# Patient Record
Sex: Male | Born: 1940 | Race: White | Hispanic: No | Marital: Married | Smoking: Never smoker
Health system: Southern US, Community
[De-identification: ages and names within clinical notes are randomized; demographics above are authoritative.]

## PROBLEM LIST (undated history)

## (undated) DIAGNOSIS — D509 Iron deficiency anemia, unspecified: Secondary | ICD-10-CM

## (undated) DIAGNOSIS — K5090 Crohn's disease, unspecified, without complications: Secondary | ICD-10-CM

## (undated) DIAGNOSIS — D72819 Decreased white blood cell count, unspecified: Secondary | ICD-10-CM

## (undated) DIAGNOSIS — N400 Enlarged prostate without lower urinary tract symptoms: Secondary | ICD-10-CM

## (undated) DIAGNOSIS — IMO0001 Reserved for inherently not codable concepts without codable children: Secondary | ICD-10-CM

## (undated) DIAGNOSIS — N200 Calculus of kidney: Secondary | ICD-10-CM

## (undated) DIAGNOSIS — I10 Essential (primary) hypertension: Secondary | ICD-10-CM

## (undated) DIAGNOSIS — C9200 Acute myeloblastic leukemia, not having achieved remission: Secondary | ICD-10-CM

## (undated) DIAGNOSIS — G629 Polyneuropathy, unspecified: Secondary | ICD-10-CM

## (undated) HISTORY — PX: CATARACT EXTRACTION: SUR2

## (undated) HISTORY — PX: LUMBAR FUSION: SHX111

## (undated) HISTORY — PX: KIDNEY STONE SURGERY: SHX686

---

## 1997-10-29 ENCOUNTER — Ambulatory Visit (HOSPITAL_COMMUNITY): Admission: RE | Admit: 1997-10-29 | Discharge: 1997-10-29 | Payer: Self-pay | Admitting: Gastroenterology

## 1998-05-19 ENCOUNTER — Ambulatory Visit (HOSPITAL_COMMUNITY): Admission: RE | Admit: 1998-05-19 | Discharge: 1998-05-19 | Payer: Self-pay | Admitting: Cardiology

## 1999-05-09 HISTORY — PX: CARDIAC CATHETERIZATION: SHX172

## 2000-06-13 ENCOUNTER — Encounter: Payer: Self-pay | Admitting: Family Medicine

## 2000-06-13 ENCOUNTER — Ambulatory Visit (HOSPITAL_COMMUNITY): Admission: RE | Admit: 2000-06-13 | Discharge: 2000-06-13 | Payer: Self-pay | Admitting: Family Medicine

## 2000-06-21 ENCOUNTER — Ambulatory Visit (HOSPITAL_COMMUNITY): Admission: RE | Admit: 2000-06-21 | Discharge: 2000-06-21 | Payer: Self-pay | Admitting: Gastroenterology

## 2000-06-21 ENCOUNTER — Encounter (INDEPENDENT_AMBULATORY_CARE_PROVIDER_SITE_OTHER): Payer: Self-pay | Admitting: Specialist

## 2000-07-10 ENCOUNTER — Encounter: Payer: Self-pay | Admitting: Gastroenterology

## 2000-07-10 ENCOUNTER — Encounter: Admission: RE | Admit: 2000-07-10 | Discharge: 2000-07-10 | Payer: Self-pay | Admitting: Gastroenterology

## 2000-12-21 ENCOUNTER — Encounter: Admission: RE | Admit: 2000-12-21 | Discharge: 2000-12-21 | Payer: Self-pay | Admitting: Family Medicine

## 2000-12-21 ENCOUNTER — Encounter: Payer: Self-pay | Admitting: Family Medicine

## 2000-12-22 ENCOUNTER — Encounter: Admission: RE | Admit: 2000-12-22 | Discharge: 2000-12-22 | Payer: Self-pay | Admitting: Family Medicine

## 2000-12-22 ENCOUNTER — Encounter: Payer: Self-pay | Admitting: Family Medicine

## 2000-12-26 ENCOUNTER — Encounter: Admission: RE | Admit: 2000-12-26 | Discharge: 2000-12-26 | Payer: Self-pay | Admitting: Neurosurgery

## 2000-12-26 ENCOUNTER — Encounter: Payer: Self-pay | Admitting: Neurosurgery

## 2001-01-01 ENCOUNTER — Encounter: Payer: Self-pay | Admitting: Neurosurgery

## 2001-01-01 ENCOUNTER — Encounter: Admission: RE | Admit: 2001-01-01 | Discharge: 2001-01-01 | Payer: Self-pay | Admitting: Neurosurgery

## 2001-01-15 ENCOUNTER — Encounter: Admission: RE | Admit: 2001-01-15 | Discharge: 2001-01-15 | Payer: Self-pay | Admitting: Neurosurgery

## 2001-01-15 ENCOUNTER — Encounter: Payer: Self-pay | Admitting: Neurosurgery

## 2001-06-11 ENCOUNTER — Encounter (INDEPENDENT_AMBULATORY_CARE_PROVIDER_SITE_OTHER): Payer: Self-pay | Admitting: Specialist

## 2001-06-11 ENCOUNTER — Ambulatory Visit (HOSPITAL_COMMUNITY): Admission: RE | Admit: 2001-06-11 | Discharge: 2001-06-11 | Payer: Self-pay | Admitting: Gastroenterology

## 2001-07-10 ENCOUNTER — Encounter: Payer: Self-pay | Admitting: Family Medicine

## 2001-07-10 ENCOUNTER — Ambulatory Visit (HOSPITAL_COMMUNITY): Admission: RE | Admit: 2001-07-10 | Discharge: 2001-07-10 | Payer: Self-pay | Admitting: Family Medicine

## 2002-03-27 ENCOUNTER — Encounter: Payer: Self-pay | Admitting: Neurosurgery

## 2002-03-27 ENCOUNTER — Encounter: Admission: RE | Admit: 2002-03-27 | Discharge: 2002-03-27 | Payer: Self-pay | Admitting: Neurosurgery

## 2002-03-30 ENCOUNTER — Ambulatory Visit (HOSPITAL_BASED_OUTPATIENT_CLINIC_OR_DEPARTMENT_OTHER): Admission: RE | Admit: 2002-03-30 | Discharge: 2002-03-30 | Payer: Self-pay | Admitting: Cardiology

## 2002-04-10 ENCOUNTER — Encounter: Payer: Self-pay | Admitting: Neurosurgery

## 2002-04-10 ENCOUNTER — Encounter: Admission: RE | Admit: 2002-04-10 | Discharge: 2002-04-10 | Payer: Self-pay | Admitting: Neurosurgery

## 2002-08-25 ENCOUNTER — Encounter: Admission: RE | Admit: 2002-08-25 | Discharge: 2002-11-23 | Payer: Self-pay | Admitting: Family Medicine

## 2003-09-23 ENCOUNTER — Inpatient Hospital Stay (HOSPITAL_COMMUNITY): Admission: RE | Admit: 2003-09-23 | Discharge: 2003-09-29 | Payer: Self-pay | Admitting: Neurosurgery

## 2003-12-14 ENCOUNTER — Encounter: Admission: RE | Admit: 2003-12-14 | Discharge: 2003-12-14 | Payer: Self-pay | Admitting: Family Medicine

## 2004-05-18 ENCOUNTER — Inpatient Hospital Stay (HOSPITAL_COMMUNITY): Admission: AD | Admit: 2004-05-18 | Discharge: 2004-05-20 | Payer: Self-pay | Admitting: Internal Medicine

## 2004-08-08 ENCOUNTER — Inpatient Hospital Stay (HOSPITAL_COMMUNITY): Admission: EM | Admit: 2004-08-08 | Discharge: 2004-08-12 | Payer: Self-pay | Admitting: Emergency Medicine

## 2004-09-02 ENCOUNTER — Ambulatory Visit (HOSPITAL_COMMUNITY): Admission: RE | Admit: 2004-09-02 | Discharge: 2004-09-02 | Payer: Self-pay | Admitting: *Deleted

## 2005-02-09 ENCOUNTER — Ambulatory Visit: Payer: Self-pay | Admitting: Internal Medicine

## 2005-05-10 ENCOUNTER — Encounter: Admission: RE | Admit: 2005-05-10 | Discharge: 2005-05-10 | Payer: Self-pay | Admitting: Family Medicine

## 2009-06-07 ENCOUNTER — Encounter: Admission: RE | Admit: 2009-06-07 | Discharge: 2009-06-07 | Payer: Self-pay | Admitting: Gastroenterology

## 2010-09-23 NOTE — H&P (Signed)
[name], WESBY                ACCOUNT NO.:  [id number]   MEDICAL RECORD NO.:  [id number]          PATIENT TYPE:  EMS   LOCATION:  ED                           FACILITY:  [facility]   PHYSICIAN:  Hollice Espy, M.D.DATE OF BIRTH:  20-Apr-1941   DATE OF ADMISSION:  05/18/2004  DATE OF DISCHARGE:                                HISTORY & PHYSICAL   CONSULTING PHYSICIAN FOR THE CASE:  Fayrene Fearing L. Randa Evens, M.D., [facility].   CHIEF COMPLAINT:  Bright-red blood from the rectum.   HISTORY OF PRESENT ILLNESS:  The patient is a 70 year old white male with a  past medical history of hypertension, chronic back pain and Crohn's disease.  He has been doing relatively well, followed by [facility]. His last  colonoscopy was done approximately a year ago. At that time diverticulosis  was noted as well as a few polyps. The patient has had no problems with  bleeding, and then starting earlier this evening he had a bowel movement and  noticed a significant amount of bright-red blood. This continued and he  filled up the bowl with bright blood. He became very lightheaded and briefly  passed out, not recalling events for a few minutes. His wife became quite  concerned, called the paramedics and had the patient brought in to the  [facility]. There the patient was noted to be normotensive  and not tachycardic. However, his H&H was noted to be decreased at 8.7 and  26.9, however, he did have an MCV at 72. The rest of his lab work was  unremarkable with a noticed glucose of 172. The patient received subsequent  IV fluids, and [facility] were contacted who after reviewing  the patient's labs asked that the patient be admitted under [facility]. Currently the patient states that he is feeling better.  He denies any headaches, visual changes, dysphagia, chest pain,  palpitations, shortness of breath, wheeze, cough, abdominal pain, hematuria,  dysuria, constipation, or  diarrhea. He denies any focal extremity pain and  weakness, but overall feels quite fatigued.   PAST MEDICAL HISTORY:  1.  Crohn's disease.  2.  Diverticulosis.  3.  History of colonic polyps.  4.  Chronic back pain, status post surgery.  5.  GERD.  6.  Hypertension.  7.  He also has a sleeping disorder, I am not sure if this is sleep apnea or      not.   MEDICATIONS:  1.  The patient is on Flexeril 1 p.o. t.i.d. p.r.n.  2.  AcipHex 20 p.o. daily.  3.  Pentasa 1000 mg 4 times a day.  4.  Diovan 160/12.5 p.o. daily.  5.  Cardizem ER 240 mg p.o. daily.  6.  Flonase several times a week.   ALLERGIES:  The patient has no known drug allergies.   SOCIAL HISTORY:  He denies any tobacco, alcohol or drug use.   FAMILY HISTORY:  Noncontributory.   PHYSICAL EXAMINATION:  VITAL SIGNS ON ADMISSION:  Temperature 97.8, heart  rate 70, blood pressure 118/74, respirations 20, O2 saturation 100% on  room  air.  GENERAL:  The patient is alert and oriented x3, in no apparent distress.  HEENT:  Normocephalic, atraumatic. His mucous membranes are slightly dry.  NECK:  He has no carotid bruits.  HEART:  Regular rate and rhythm, S1 and S2.  LUNGS:  Clear to auscultation bilaterally.  ABDOMEN:  Soft, obese, nontender, positive bowel sounds. Please note that  the patient prior to having his bloody BM had some mild abdominal discomfort  in the right lower quadrant, this was mild, about a 4/10; this quickly  resolved and he currently has no abdominal pain and no abdominal tenderness  with exam.  EXTREMITIES:  Showed no clubbing or cyanosis, but perhaps a trace pitting  edema.   LABORATORY WORK:  White count of 9.9; 86% shift; H&H 8.7 and 26.9, MCV of  72, platelet count 284. Sodium 140, potassium 4.1, chloride 112, bicarb 26,  BUN 14, creatinine 1.2, glucose 172, albumin 2.5, total protein 5. The rest  of his LFTs are within normal limits. PT 13.8, INR 1.1.   ASSESSMENT AND PLAN:  1.  Lower  gastrointestinal bleed, suspected likely diverticular. He has a      decreased H&H, but I am unsure of his baseline as the patient has a low      MCV. We will go ahead and check an H&H q.8h. [facility] to see in the      morning. Diverticulosis was noted on his C-scope approximately a year      ago.  2.  History of Crohn's disease. Several years on Pentasa, it is moderately      well controlled.  3.  Microcytic anemia. Will check the patient's labs to see how far back his      MCV has been this low.  4.  Chronic back pain. On Flexeril, nonsteroidals and on AcipHex.  5.  Hypertension. For now since he is having episodes of GI bleeding will      hold his antihypertensive medications.     Send   SKK/MEDQ  D:  05/18/2004  T:  05/18/2004  Job:  [id number]   cc:   Bryan Lemma. Manus Gunning, M.D.  [address] [place]  [place] [address]  Fax: [phone]   Llana Aliment. Malon Kindle., M.D.  [address], [address]  [place]  [place] [address]  Fax: [phone]

## 2010-09-23 NOTE — Procedures (Signed)
[place]  Patient:    [name], [name] Visit Number: [id number] MRN: [id number]          Service Type: END Location: ENDO Attending Physician:  Orland Mustard Dictated by:   Llana Aliment. Randa Evens, M.D. Proc. Date: 06/11/01 Admit Date:  06/11/2001   CC:         Dyanne Carrel, M.D.   Procedure Report  DATE OF BIRTH:  Dec 20, 1940  PROCEDURE PERFORMED:  Esophagogastroduodenscopy with biopsies.  ENDOSCOPIST:  Llana Aliment. Edwards, M.D.  MEDICATIONS:  Versed 7.5 mg, fentanyl 75 mcg IV.  INDICATIONS:  Iron deficiency anemia.  DESCRIPTION OF PROCEDURE:  The procedure had been explained to the patient and consent obtained.  With the patient in the left lateral decubitus position, the Olympus video endoscope was inserted blindly in the esophagus and advanced under direct visualization.  The stomach was entered.  The pylorus identified and passed.  The duodenum including the bulb and second portion were seen well.  We went to the full extent of the scope which was felt to be down in the third part of the duodenum and took random biopsies to look for evidence of celiac disease.  The entire duodenum including the second and third portion of the duodenal bulb were normal.  The antrum and body of the stomach were normal.  The fundus and cardia were seen well on the retroflex view and were normal.  The esophagus was seen well upon removal of the scope and appeared endoscopically normal.  The patient tolerated the procedure quite well, was maintained on low-flow oxygen and pulse oximeter throughout the procedure with no obvious abnormalities.  ASSESSMENT:  Iron deficiency anemia with normal endoscopy.  PLAN:  Will check path but keep on the same medicines for now.  Take an iron tablet daily.  See back in the office in two months. Dictated by:   Llana Aliment. Randa Evens, M.D. Attending Physician:  Orland Mustard DD:  06/11/01 TD:  06/12/01 Job:  [id number]

## 2010-09-23 NOTE — Consult Note (Signed)
[name], [name]                ACCOUNT NO.:  [id number]   MEDICAL RECORD NO.:  [id number]          PATIENT TYPE:  INP   LOCATION:  5018                         FACILITY:  [facility]   PHYSICIAN:  Graylin Shiver, M.D.   DATE OF BIRTH:  [birth year]   DATE OF CONSULTATION:  08/08/2004  DATE OF DISCHARGE:                                   CONSULTATION   REASON FOR CONSULTATION:  The patient is a 70 year old Caucasian male with a  history of diverticulosis and Crohn's disease.  He was admitted to the  hospital because of lower GI bleeding. The patient states that he began to  experience the rectal bleeding last night. He has continued to have some  this morning, although it has slowed down. His hemoglobin on admission was  10.9 and dropped to 8.3.  He is receiving transfusions.   The patient underwent colonoscopy in January because of a similar episode of  lower GI bleeding which was felt to be secondary to diverticulosis. The  patient has a narrowing in the region of the ascending colon which has been  known for quite sometime.  Both I and Dr. Randa Evens have been unable to  transverse this narrowed area in the ascending colon, which was seen at his  last colonoscopy also.   PAST HISTORY:  Medical problems:  In addition to Crohn's and diverticulosis,  he has hypertension, obesity, DJD, GERD.   ALLERGIES:  NONE KNOWN.   MEDICATIONS PRIOR TO ADMISSION:  Aciphex, diltiazem SR, hydrochlorothiazide,  Pentasa, aspirin.   SOCIAL HISTORY:  Does not smoke or drink alcohol.   REVIEW OF SYSTEMS:  No weakness or lightheadedness. No shortness of breath,  cough or sputum production. No anginal chest pains.   PHYSICAL:  GENERAL:  He does not appear in any acute distress.  VITALS:  Stable  HEART: Regular rhythm. No murmurs.  LUNGS:  Clear.  ABDOMEN:  Bowel sounds are normal. It is soft, nontender, no  hepatosplenomegaly.   IMPRESSION:  Lower gastrointestinal bleeding, most probably of  diverticular  origin again.   PLAN:  Transfuse as needed. Follow H&H. I will obtain a nuclear medicine GI  bleeding scan to see if we can localize the site of bleeding. At this time I  do not think he needs another colonoscopy since he just had one in January.      SFG/MEDQ  D:  08/08/2004  T:  08/08/2004  Job:  [id number]   cc:   Fayrene Fearing L. Malon Kindle., M.D.  [address], [address]  [place]  [place] [address]  Fax: [phone]   Candyce Churn, M.D.  [address] [place]  [place] [address]  Fax: [phone]

## 2010-09-23 NOTE — Procedures (Signed)
[facility]  Patient:    [name], [name]                       MRN: [id number] Proc. Date: 06/21/00 Adm. Date:  [id number] Attending:  Orland Mustard CC:         Blair Heys, M.D.                           Procedure Report  PROCEDURE:  Colonoscopy and biopsy.  MEDICATIONS:  Fentanyl 100 mcg, Versed 10 mg IV.  SCOPE:  Pediatric video colonoscope.  INDICATIONS:  A 70 year old gentleman who has had colonoscopy performed in 1999, showing what was felt to be a hepatic flexure stricture.  Concern was whether this was ischemic or Crohns.  Biopsy showed focal active mucus colitis.  He has been doing well since then and has not had any problems.  He developed abdominal pain.  A CT of the abdomen was performed showing a left renal cyst, diverticulosis, with thickening of the distal terminal ileum.  No other clear findings.  This was felt to be either Crohns disease or infectious enterocolitis.  There were no signs of active diverticulitis.  This procedure is done to try to evaluate this area endoscopically.  The patient has been on therapy with Flagyl and Cipro.  DESCRIPTION OF PROCEDURE:  The procedure had been explained to the patient and consent obtained.  With the patient in the left lateral decubitus, the adult Olympus pediatric video colonoscope was inserted.  We chose to use the pediatric scope since we had had problems getting through the stricture with the adult scope last time, in the hopes that this smaller scope would allow [place] to pass the strictured area.  The prep was excellent.  We were able to advance to the right colon and what I was felt was the mid to proximal ascending colon and, again, the strictured area was seen.  We were not able to pass with the scope primarily due to the tortuosity of the colon and the fact that despite multiple position changes, abdominal pressure, etc., we were unable to pass through the strictured area.  At  this point, I biopsied around the area and took random biopsies throughout the colon.  The mucosa was completely normal with good vascular pattern.  No evidence of active colitis throughout.  There were scattered diverticula.  In jar #1, we had biopsies of the right and transverse colon, in jar #2, the descending and sigmoid colon.  Scattered diverticula were seen.  The scope was withdrawn.  The patient tolerated the procedure well.  ASSESSMENT: 1. 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 2. No evidence of gross colitis. 3. Scattered diverticulosis.  PLAN:  We will check pathology for evidence of inflammatory bowel disease.  We will arrange a small-bowel follow-through.  We will call him at home and get that set up at the same location as the CT scan.  We will see back in the office in 2-3 weeks. DD:  06/21/00 TD:  06/22/00 Job: [id number]

## 2010-09-23 NOTE — Op Note (Signed)
NAMEWILLIAN, [name]                ACCOUNT NO.:  [id number]   MEDICAL RECORD NO.:  [id number]          PATIENT TYPE:  INP   LOCATION:  3707                         FACILITY:  [facility]   PHYSICIAN:  Graylin Shiver, M.D.   DATE OF BIRTH:  06/23/1940   DATE OF PROCEDURE:  05/20/2004  DATE OF DISCHARGE:                                 OPERATIVE REPORT   PROCEDURE:  Colonoscopy.   INDICATIONS:  The patient is a 70 year old male with a history of Crohn's  disease affecting the terminal ileum. He presented to the hospital with  rectal bleeding. There is also a history of diverticulosis. He also has a  history of a stricture or narrowing in the region of the ascending colon  based on prior colonoscopies.  The patient is undergoing colonoscopy as part  of his inpatient evaluation due to the rectal bleeding. The bleeding did  require the blood transfusions.   Informed consent was obtained after explanation of the risks of bleeding,  infection and perforation.   PREMEDICATION:  Fentanyl 60 mcg IV, Versed 7 mg IV.   PROCEDURE:  With the patient in the left lateral decubitus position, a  rectal exam was performed. No masses were felt. The Olympus colonoscope was  inserted into the rectum and advanced around the colon to the region of the  proximal ascending colon.  At this point there was a narrowing noted in the  lumen.  His mucosa looked normal. It was just narrowed. I could not get the  scope beyond this point.  I placed the patient on his back.  I placed the  patient on his right side but despite all maneuvers, could not advance the  scope beyond this point.  The scope was brought out visualizing the mucosa.  The ascending colon otherwise looked normal. The transverse colon looked  normal. There were numerous scattered diverticula in the descending and  sigmoid colon.  The rectum looked normal.  He tolerated the procedure well  without complications.   IMPRESSION:  1.  Left-sided  diverticulosis.  I believe that his lower gastrointestinal      bleed was of diverticular origin.  2.  Narrowing of the lumen in the proximal ascending colon. The mucosa in      this region looked smooth and normal. It is just narrowed.  3.  No evidence of blood seen on this examination.   PLAN:  I believe that the patient's lower GI bleed was secondary to a  diverticular origin. There is no evidence of active bleeding now.  His  hemoglobin today is 9.2.  I think that if there is no further bleeding, He  could go home and follow-up as an outpatient.       SFG/MEDQ  D:  05/20/2004  T:  05/20/2004  Job:  [id number]   cc:   Sherin Quarry, MD   Llana Aliment. Malon Kindle., M.D.  [address], [address]  [place]  [place] [address]  Fax: [phone]

## 2010-09-23 NOTE — Consult Note (Signed)
NAME:  [name], [name]                ACCOUNT NO.:  [id number]   MEDICAL RECORD NO.:  [id number]          PATIENT TYPE:  INP   LOCATION:  3707                         FACILITY:  [facility]   PHYSICIAN:  Graylin Shiver, M.D.   DATE OF BIRTH:  Mar 24, 1941   DATE OF CONSULTATION:  05/18/2004  DATE OF DISCHARGE:                                   CONSULTATION   REASON FOR CONSULTATION:  This patient is a 70 year old male who presented  to the emergency room at [facility] last evening with complaints of  rectal bleeding which started at about 6:30 p.m. yesterday.  The patient  continued to have bleeding and was subsequently admitted to the hospital.  He has a history of Crohn's disease and is being followed by Dr. Carman Ching.  He also has a history of diverticulosis.  His prior colonoscopy  was in February 2003 at which time he was found to have scattered  diverticula, no evidence of active colitis and a stricture at the region of  the ascending colon.  He has been on Pentasa for his Crohn's disease.   The patient also had an EGD in February 2003 which was normal.  The patient  denies any hematemesis.   PAST HISTORY:  1.  Crohn's disease.  2.  Diverticulosis.  3.  Chronic back pain.  4.  History of GERD.  5.  Hypertension.   PAST SURGICAL HISTORY:  Back surgery.   ALLERGIES:  NONE KNOWN.   MEDICATIONS PRIOR TO ADMISSION:  Pentasa, Aciphex, Diovan, Cardizem,  Flexeril.   SOCIAL HISTORY:  He does not smoke or drink alcohol.   FAMILY HISTORY:  Positive for colon cancer.   REVIEW OF SYSTEMS:  No chest pain, shortness of breath, cough or sputum  production.  The patient did pass out last night when he had this bleeding.   PHYSICAL EXAMINATION:  GENERAL:  He does not appear in any acute distress.  HEENT:  He is nonicteric.  NECK:  Supple.  HEART:  Regular rhythm, no murmurs.  LUNGS:  Clear.  ABDOMEN:  Bowel sounds are normal, soft, nontender, no hepatosplenomegaly.   IMPRESSION:  Lower gastrointestinal bleeding, this is most likely of  diverticular origin.  The patient also has a history of Crohn's disease  being treated with Pentasa.   Hemoglobin 8.1.   PLAN:  The patient is going to be transfused blood.  He appears to be stable  at this time, he has not had any further episodes of bleeding today.  He  states that he is due for a repeat colonoscopy some time in the near future.  I think that in view of the rectal bleeding that he has had now and prior  history that we can proceed with colonoscopy in the next couple of days  after an adequate prep.       SFG/MEDQ  D:  05/18/2004  T:  05/18/2004  Job:  [id number]   cc:   Fayrene Fearing L. Malon Kindle., M.D.  [address]  [place]  [place] [address]  Fax: [phone]  [facility]

## 2010-09-23 NOTE — Discharge Summary (Signed)
NAMECLAUDY, [name]                ACCOUNT NO.:  [id number]   MEDICAL RECORD NO.:  [id number]          PATIENT TYPE:  INP   LOCATION:  5018                         FACILITY:  [facility]   PHYSICIAN:  Jackie Plum, M.D.DATE OF BIRTH:  1940/08/12   DATE OF ADMISSION:  08/07/2004  DATE OF DISCHARGE:  08/12/2004                                 DISCHARGE SUMMARY   DISCHARGE DIAGNOSES:  1.  Lower gastrointestinal bleed presumed to be diverticular bleed.  2.  Anemia of chronic blood loss secondary to diagnosis #1.  3.  History of Crohn's disease and diverticulosis.  4.  Hypertension.  5.  Obesity.  6.  Degenerative joint disease.   DISCHARGE MEDICATIONS:  Patient was asked to resume his home medications.  He was discharged on iron 325 mg b.i.d.   He was asked to follow up with Dr. Evette Cristal in 10-14 days.   REASON FOR ADMISSION:  GI bleed.  Patient with history of diverticulosis and  Crohn's disease.  Was admitted to the hospital on account of hematochezia.  He presented with hemoglobin of 10.9 which subsequently dropped to 8.3.  Was  admitted to the hospital, received blood transfusion .  His hemoglobin was  monitored.  He was seen in consultation by Dr. Evette Cristal of GI medicine.  He  recommended bleeding scan and came out to be negative.  Expectant  observation and management was instituted with followup of hemoglobin.  He  was discharged in stable satisfactory condition on August 12, 2004 with a plan  for him to see GI medicine as noted above.       GO/MEDQ  D:  11/10/2004  T:  11/10/2004  Job:  [id number]

## 2010-09-23 NOTE — Discharge Summary (Signed)
NAME:  [name], [name]                          ACCOUNT NO.:  [id number]   MEDICAL RECORD NO.:  [id number]                   PATIENT TYPE:  INP   LOCATION:  3027                                 FACILITY:  [facility]   PHYSICIAN:  Coletta Memos, M.D.                  DATE OF BIRTH:  Jun 02, 1940   DATE OF ADMISSION:  09/23/2003  DATE OF DISCHARGE:  09/29/2003                                 DISCHARGE SUMMARY   ADMISSION DIAGNOSES:  Spondylolisthesis L5-S1, spondylolysis L5, lumbar  radiculopathy L5 bilateral.   POSTOPERATIVE AND DISCHARGE DIAGNOSES:  Spondylolisthesis L5-S1,  spondylolysis L5, lumbar radiculopathy L5 bilateral.   PROCEDURE:  L5 Gill, posterolateral arthrodesis L5-S1, posterior and  nonsegmental instrumentation L5 S1, morselized auto and allograft, posterior  lumbar antibody fusion L5-S1, 11 mm (P-cages placed bilaterally).   COMPLICATIONS:  None.   DISCHARGE STATUS:  Alive and well.   INDICATIONS:  [name] was admitted and taken to the operating room for a  longstanding spondylolisthesis and history of low back pain.  His operation  went well and postoperatively he was doing well.  On postop day #2 ,when he  first got up he had significant amount of drainage from his wound. There was  old blood and he was taken back to bed.  The wound continued to drain over  the next few days though drain with lesser amounts over that course of time.  He has one episode supposedly of running a fever to approximately 102  degrees.  He was started on vancomycin when that was done.  He was given  Kefzol on the first day when the drainage was noted on post op day #2. He  received 48 hours wroth of vancomycin.  His discharge wound is clean ,dry,  without signs of infection.  He is afebrile moving all extremities well.  He  has had a bowel movement.  He is using his bladder without difficulty.                                                Coletta Memos, M.D.    KC/MEDQ  D:  09/29/2003   T:  09/30/2003  Job:  [id number]

## 2010-09-23 NOTE — Discharge Summary (Signed)
[name], [name]                ACCOUNT NO.:  [id number]   MEDICAL RECORD NO.:  [id number]          PATIENT TYPE:  INP   LOCATION:  3707                         FACILITY:  [facility]   PHYSICIAN:  Theone Stanley, MD   DATE OF BIRTH:  1940/05/14   DATE OF ADMISSION:  05/18/2004  DATE OF DISCHARGE:  05/20/2004                                 DISCHARGE SUMMARY   ADMITTING DIAGNOSES:  1.  Gastrointestinal bleed.  2.  Crohn's disease.  3.  Hypertension.  4.  Gastroesophageal reflux disease.  5.  Chronic low back pain.  6.  History of diverticulosis.   DISCHARGE DIAGNOSES:  1.  Gastrointestinal bleed most likely secondary to diverticulitis.  2.  Crohn's disease.  3.  Chronic low back pain.  4.  Gastroesophageal reflux disease.  5.  Hypertension.   CONSULTS:  [facility], Dr. Evette Cristal   PROCEDURES/DIAGNOSTIC TESTS:  Patient had a colonoscopy performed on May 20, 2004.  He had diverticulosis of the left colon, narrowing of the lumen  in proximal ascending colon which was seen before, smooth normal mucosa.  Impression was suspected bleed secondary to diverticulosis.  No active  bleeding.  Full report still pending.   HOSPITAL COURSE:  [name] is a very pleasant 70 year old male with past  medical history of Crohn's, hypertension, GERD presenting with bright red  blood per rectum.  The day of his admission patient had bowel movement which  noted a significant amount of blood.  He had another one which filled the  bowl with bright red blood.  He became very lightheaded and briefly passed  out.  Did not remember events after that.  EMS was called at that point in  time and he was brought to [facility] Emergency Room.  His hemoglobin and  hematocrit at that time was 8.7 and 26.9.  Patient received IV fluids.  It  was typed and crossed.  However, because there were no beds in [place]  he was transferred over to [facility].  At that point in time patient was  monitored with serial  H&Hs.  A repeat blood level was at 7.6, 23.1.  Patient  was also orthostatic.  He was transfused 2 units.  He had one more episode  of bright red blood per rectum in the next day.  Patient was scheduled for a  colonoscopy on the 13th.  He underwent this.  There was no evidence of  bleeding and he had no more occurrences.  His hemoglobin had stabilized to  9.2, hematocrit at 27.4.  Because he had no active bleeding, it was felt  that he could go home.  He was stable.  His vital signs on discharge were  temperature of 98.9, pulse of 66, respiratory rate 18, systolic blood  pressure 138/82, saturating 96% on room air.  Patient was discharged in  stable condition.   DISCHARGE MEDICATIONS:  1.  Aciphex 20 mg one p.o. daily.  2.  Pentasa 1000 mg one p.o. q.i.d.  3.  Flexeril 10 mg one p.o. t.i.d. p.r.n.  4.  His BP  medications will be on hold until he sees his primary care      physician.  The medications are Diovan 160/12.5 mg one p.o. daily and      Cardizem ER 240 mg one p.o. daily.   DISCHARGE INSTRUCTIONS:  Patient was instructed he could resume his previous  diet to avoid episodes of constipation.  He is to check his blood pressure  daily.  If greater than 160 systolic he is to call his primary care  physician.  Follow up with Dr. Manus Gunning 7-10 days and Dr. Randa Evens two to  three weeks.  He is to have CBC drawn when he sees Dr. Manus Gunning.  Dr. Manus Gunning  will make the determination to restart his blood pressure medications at  that time.      Atta   AEJ/MEDQ  D:  05/20/2004  T:  05/20/2004  Job:  [id number]   cc:   Bryan Lemma. Manus Gunning, M.D.  [address] [place]  [place] [address]  Fax: [phone]   Llana Aliment. Malon Kindle., M.D.  [address], [address]  [place]  [place] [address]  Fax: [phone]

## 2010-09-23 NOTE — Op Note (Signed)
NAME:  [name], [name]                          ACCOUNT NO.:  [id number]   MEDICAL RECORD NO.:  [id number]                   PATIENT TYPE:  INP   LOCATION:  2899                                 FACILITY:  [facility]   PHYSICIAN:  Coletta Memos, M.D.                  DATE OF BIRTH:  Nov 27, 1940   DATE OF PROCEDURE:  09/23/2003  DATE OF DISCHARGE:                                 OPERATIVE REPORT   PREOPERATIVE DIAGNOSIS:  Lumbar spondylolisthesis, L5-S1; lumbar  spondylolysis, L5; lumbar radiculopathy, L5.   POSTOPERATIVE DIAGNOSIS:  Lumbar spondylolisthesis, L5-S1; lumbar  spondylolysis, L5; lumbar radiculopathy, L5.   OPERATION PERFORMED:  1. L5 Gill procedure.  2. Posterolateral arthrodesis, L5-S1.  3. Posterior nonsegmental instrumentation, L5-S1, pedicle screw placement.  4. Morcelized autograft and allograft.  5. Posterior lumbar interbody fusion, L5-S1, 11 mm Synthes PEAK cages.   SURGEON:  Coletta Memos, M.D.   ASSISTANT:  Hewitt Shorts, M.D.   ANESTHESIA:  General endotracheal.   COMPLICATIONS:  None.   INDICATIONS FOR PROCEDURE:  [name] is a 70 year old whom I have  followed now for a number of years.  He has a bilateral spondylolysis of L5,  a grade 1 spondylolisthesis of L5 on S1and resultant foraminal compression  of the right and left L5 nerve roots.  At this point he has decided there  has been too much pain.  I therefore recommended and he agreed to undergo  posterolateral arthrodesis and posterior lumbar interbody fusion and Gill  procedure.   DESCRIPTION OF PROCEDURE:  [name] was brought to the operating room  intubated and placed under general anesthetic without difficulty.  He was  rolled prone onto body rolls after having a Foley catheter placed under  sterile conditions.  All pressure points were properly padded.  The skin was  prepped and he was draped in sterile fashion.  Using a preoperative  localizing film as a guide, I infiltrated 20 mL  0.5% lidocaine 1:200,000  strength epinephrine.  I opened the skin and took that down to the  thoracolumbar fascia.  I exposed the lamina of L3, L4, L5 and at S1  bilaterally.  I then proceeded with a Gill of L5 using Leksell rongeur and  Kerrison punch along with straight curets.  After performing a Gill  procedure, I then proceeded to perform a diskectomy of L5-S1.  The disk  space was quite tight initially, but after removing some of the disk  material and getting instruments into the disk space, I was able to then  decompress it quite well.  After decompressing the disk space and making  sure that both L5 nerve roots were very well decompressed, I then was able  to perform a posterior lumbar interbody fusion using 11 mm Synthes PEAK  cages packed with morcelized autograft.  That was done bilaterally.  X-ray  showed that the PEAK cates were in good  position, so I then turned my  attention to the posterolateral arthrodesis.  I decorticated the transverse  processes of L5 and the sacral ala bilaterally.  Having identified the  pedicle, I then with use of fluoroscopic guidance first drilled, placed the  pedicle probe, then finally a screw at each level, checking each level to  make sure that there was no bony reach using a pedicle probe.  Four screws  were placed, two 50 x 6.5 mm screws at L5, 45 x 6.5 mm screws at S1.  I  placed rods in both locations.  I packed morcelized auto and allograft  posterolaterally.  When I was done, I irrigated the wound.  I then closed  the wound in layered fashion with Dr. Earl Gala assistance.  The wound was  closed in a layered fashion using Vicryl sutures to reapproximate the wound.  Dermabond, Steri-Strips and 4 x 4s were used for sterile dressing.  The  patient tolerated the procedure well.                                               Coletta Memos, M.D.    KC/MEDQ  D:  09/23/2003  T:  09/24/2003  Job:  [id number]

## 2010-09-23 NOTE — H&P (Signed)
NAMEFAY, SWIDER                ACCOUNT NO.:  [id number]   MEDICAL RECORD NO.:  [id number]          PATIENT TYPE:  INP   LOCATION:  5018                         FACILITY:  [facility]   PHYSICIAN:  Candyce Churn, M.D.DATE OF BIRTH:  Feb 04, 1941   DATE OF ADMISSION:  08/07/2004  DATE OF DISCHARGE:                                HISTORY & PHYSICAL   CHIEF COMPLAINT:  Rectal bleeding.   HISTORY OF PRESENT ILLNESS:  Mr. Coryell is a very pleasant 70 year old male  with a history of:  1)  Crohn's disease; 2) diverticulosis; 3)  hypertension; 4)  obesity; 5)  lumbar DJD, with history of L5-S1 surgery in  the past; 6)  ventral hernia; 7)  GERD; 8)  history of elevated heart rate;  9)  GI bleed in January 2006, felt likely to be secondary to left-sided  diverticulosis.  He required two transfusions at the time, and hemoglobin  dropped to around 8 g/dL.   The patient had been feeling well lately but developed painless rectal  bleeding with four to five loose large bloody bowel movements over the last  24 hours.  No frank melena.  The patient was resumed on aspirin by his  primary M.D. in the last month or so, since he had had no recurrent bleed  since January 2006.  His hemoglobin has now dropped back down to 8.3.  He  had a colonoscopy by Dr. Herbert Moors on May 20, 2004, which revealed  left-sided diverticulosis and a narrowed lumen in the proximal ascending  colon.   He is admitted now for transfusions and further workup.   ALLERGIES:  None.   MEDICATIONS:  1.  Aciphex 20 mg daily.  2.  Diltiazem SR 240 mg daily.  3.  Hydrochlorothiazide 25 mg daily.  4.  Pentasa 1,000 mg p.o. t.i.d.  5.  One aspirin daily - question dose.   HABITS:  No tobacco use or alcohol use.   SOCIAL HISTORY:  The patient is married.  His wife is present.  I did not  inquire about his field of work.   FAMILY HISTORY:  Noncontributory.   REVIEW OF SYSTEMS:  Denies abdominal pain, fever, or chills.   Otherwise  feels well.   PHYSICAL EXAMINATION:  GENERAL:  Obese male in no acute distress.  Feels  mildly weak, and looks somewhat pale.  VITAL SIGNS:  Temperature 99.2; pulse 97; respiratory rate 18 and  nonlabored; blood pressure 136/70; O2 saturation on room air is 98%.  HEENT:  Reveals him to be atraumatic, normocephalic.  His conjunctivae are  pale.  NECK:  Supple, without JVD.  CHEST:  Clear to auscultation.  CARDIAC:  Regular rhythm.  No murmurs.  ABDOMEN:  Obese.  He does have a ventral hernia which relaxes with abdominal  relaxation.  No abdominal tenderness.  Bowel sounds are normal.  RECTAL:  Was not performed.  It was performed in the ER by M.D. history, and  he was guaiac positive.  (He does have red blood in the toilet bowl  currently.)  EXTREMITIES:  Without cyanosis, clubbing, or edema.  BACK:  Has a well-healed lumbar scar.  There is no erythema, fluctuance, or  deformity.   Hemoglobin is down to 8.3.  It was originally 9.6 in the emergency room last  night.  MCV is low at 68, RDW elevated at 18.8.  White count normal at  8,400, platelet count 358,000.  Sodium 142, potassium 3.6, chloride 109,  bicarbonate 17, creatinine 1.1, glucose 120.   ASSESSMENT:  Recurrent gastrointestinal bleed in a 70 year old man with  history of diverticulosis and Crohn's disease.  Likely, the combination of  Pentasa and aspirin are contributing to gastrointestinal bleeding.   PLAN:  1.  GI bleed.  Transfuse 2 units.  Discontinue aspirin, and hold Pentasa for      now.  Dr. Carman Ching was notified, and he will notify the GI      physician on call.  Check hemoglobin q.6 h.  Continue IV fluids for now.  2.  Hypertension.  Continue current medicines for hypertension, and watch      for hypotension.  Will hold hydrochlorothiazide for now.  3.  GERD.  Continue PPI therapy in the form of IV Protonix.  4.  Decreased MCV and increased RDW.  May be losing blood quietly over time      with  the aspirin therapy.  Seems      to have iron deficiency.  Would refrain from aspirin use chronically.      The patient will get loaded with 2 units of blood, which should be good      for short-term therapy for iron replacement over the next couple of      weeks.   The patient agrees with the current workup and plan.      RNG/MEDQ  D:  08/08/2004  T:  08/08/2004  Job:  [id number]   cc:   Bryan Lemma. Manus Gunning, M.D.  [address] [place]  [place] [address]  Fax: [phone]   Llana Aliment. Malon Kindle., M.D.  [address], [address]  [place]  [place] [address]  Fax: [phone]   Graylin Shiver, M.D.  [address]  [address]  [place], [place] [address]  Fax: [phone]

## 2013-04-14 ENCOUNTER — Other Ambulatory Visit: Payer: Self-pay | Admitting: Family Medicine

## 2013-04-14 ENCOUNTER — Ambulatory Visit
Admission: RE | Admit: 2013-04-14 | Discharge: 2013-04-14 | Disposition: A | Discharge status: Home / Self Care | Payer: Medicare Other | Source: Ambulatory Visit | Attending: Family Medicine | Admitting: Family Medicine

## 2013-04-14 DIAGNOSIS — R05 Cough: Secondary | ICD-10-CM

## 2013-04-14 DIAGNOSIS — Z77090 Contact with and (suspected) exposure to asbestos: Secondary | ICD-10-CM

## 2013-04-15 ENCOUNTER — Other Ambulatory Visit: Payer: Self-pay | Admitting: Family Medicine

## 2013-04-15 DIAGNOSIS — R198 Other specified symptoms and signs involving the digestive system and abdomen: Secondary | ICD-10-CM

## 2013-04-18 ENCOUNTER — Ambulatory Visit
Admission: RE | Admit: 2013-04-18 | Discharge: 2013-04-18 | Disposition: A | Discharge status: Home / Self Care | Payer: Medicare Other | Source: Ambulatory Visit | Attending: Family Medicine | Admitting: Family Medicine

## 2013-04-18 DIAGNOSIS — R198 Other specified symptoms and signs involving the digestive system and abdomen: Secondary | ICD-10-CM

## 2013-04-18 MED ORDER — IOHEXOL 300 MG/ML  SOLN
125.0000 mL | Freq: Once | INTRAMUSCULAR | Status: AC | PRN
  Administered 2013-04-18: 125 mL via INTRAVENOUS

## 2013-04-22 ENCOUNTER — Telehealth: Payer: Self-pay | Admitting: Oncology

## 2013-04-22 NOTE — Telephone Encounter (Signed)
S/W PT AND GVE NP APPT 12/19 @ 10:30 W/DR. SHADAD REFERRING DR. Blair Heys DX- ANEMIA, LEUKOPENIA

## 2013-04-22 NOTE — Telephone Encounter (Signed)
C/D 04/22/13 for appt. 04/25/13

## 2013-04-24 ENCOUNTER — Other Ambulatory Visit: Payer: Self-pay | Admitting: Oncology

## 2013-04-24 DIAGNOSIS — D72820 Lymphocytosis (symptomatic): Secondary | ICD-10-CM

## 2013-04-24 DIAGNOSIS — D539 Nutritional anemia, unspecified: Secondary | ICD-10-CM

## 2013-04-25 ENCOUNTER — Ambulatory Visit (HOSPITAL_BASED_OUTPATIENT_CLINIC_OR_DEPARTMENT_OTHER): Payer: Medicare Other

## 2013-04-25 ENCOUNTER — Telehealth: Payer: Self-pay | Admitting: Oncology

## 2013-04-25 ENCOUNTER — Other Ambulatory Visit (HOSPITAL_COMMUNITY)
Admission: RE | Admit: 2013-04-25 | Discharge: 2013-04-25 | Disposition: A | Discharge status: Home / Self Care | Payer: Medicare Other | Source: Ambulatory Visit | Attending: Oncology | Admitting: Oncology

## 2013-04-25 ENCOUNTER — Encounter (INDEPENDENT_AMBULATORY_CARE_PROVIDER_SITE_OTHER): Payer: Self-pay

## 2013-04-25 ENCOUNTER — Encounter: Payer: Self-pay | Admitting: Oncology

## 2013-04-25 ENCOUNTER — Ambulatory Visit (HOSPITAL_BASED_OUTPATIENT_CLINIC_OR_DEPARTMENT_OTHER): Payer: Medicare Other | Admitting: Oncology

## 2013-04-25 ENCOUNTER — Other Ambulatory Visit (HOSPITAL_BASED_OUTPATIENT_CLINIC_OR_DEPARTMENT_OTHER): Payer: Medicare Other

## 2013-04-25 VITALS — BP 156/84 | HR 69 | Temp 97.8°F | Resp 20 | Ht 69.5 in | Wt 234.9 lb

## 2013-04-25 DIAGNOSIS — D539 Nutritional anemia, unspecified: Secondary | ICD-10-CM

## 2013-04-25 DIAGNOSIS — D72819 Decreased white blood cell count, unspecified: Secondary | ICD-10-CM | POA: Insufficient documentation

## 2013-04-25 DIAGNOSIS — D500 Iron deficiency anemia secondary to blood loss (chronic): Secondary | ICD-10-CM

## 2013-04-25 DIAGNOSIS — K5090 Crohn's disease, unspecified, without complications: Secondary | ICD-10-CM

## 2013-04-25 DIAGNOSIS — D72820 Lymphocytosis (symptomatic): Secondary | ICD-10-CM

## 2013-04-25 LAB — CHCC SMEAR

## 2013-04-25 LAB — CBC WITH DIFFERENTIAL/PLATELET
Basophils Absolute: 0 10*3/uL (ref 0.0–0.1)
EOS%: 2.2 % (ref 0.0–7.0)
Eosinophils Absolute: 0 10*3/uL (ref 0.0–0.5)
MCH: 28.4 pg (ref 27.2–33.4)
MCV: 93.1 fL (ref 79.3–98.0)
MONO#: 0.1 10*3/uL (ref 0.1–0.9)
MONO%: 4.4 % (ref 0.0–14.0)
NEUT#: 0.7 10*3/uL — ABNORMAL LOW (ref 1.5–6.5)
NEUT%: 38.2 % — ABNORMAL LOW (ref 39.0–75.0)
Platelets: 230 10*3/uL (ref 140–400)
WBC: 1.8 10*3/uL — ABNORMAL LOW (ref 4.0–10.3)

## 2013-04-25 LAB — COMPREHENSIVE METABOLIC PANEL (CC13)
ALT: 10 U/L (ref 0–55)
AST: 10 U/L (ref 5–34)
Albumin: 2.8 g/dL — ABNORMAL LOW (ref 3.5–5.0)
Alkaline Phosphatase: 103 U/L (ref 40–150)
Anion Gap: 10 mEq/L (ref 3–11)
CO2: 25 mEq/L (ref 22–29)
Calcium: 8.8 mg/dL (ref 8.4–10.4)
Glucose: 98 mg/dl (ref 70–140)
Potassium: 4 mEq/L (ref 3.5–5.1)
Total Bilirubin: 0.64 mg/dL (ref 0.20–1.20)
Total Protein: 7 g/dL (ref 6.4–8.3)

## 2013-04-25 NOTE — Progress Notes (Signed)
Checked in new patient with no financial issues. He has appt card. °

## 2013-04-25 NOTE — Progress Notes (Signed)
Please see dictate note.

## 2013-04-25 NOTE — Telephone Encounter (Signed)
appts made per 12/19 POF AVS and CAL given shh °

## 2013-04-26 NOTE — Progress Notes (Signed)
CC:   Bryan Lemma. Manus Gunning, M.D. James L. Malon Kindle., M.D.  REASON FOR CONSULTATION:  Anemia and leukocytopenia.  HISTORY OF PRESENT ILLNESS:  [name] is a pleasant 72 year old gentleman with past medical history significant for hypertension, a longstanding history of Crohn disease.  He had been treated for Crohn disease in the past, but had not had any treatment for it due to the fact that he had not had any recent flares.  He had been on Pentasa in the past, but has not been taking it for many years now.  He most recently established a care with a primary care provider at the [place], and a repeat laboratory testing showed that he had an abnormal hemoglobin of 8.4, white cell count 1.8, and for that reason, his CBC was repeated by Dr. Manus Gunning.  On 04/21/2013, his white cell count was 1.8, his hemoglobin was 8.4, and platelet count of 229.  His MCV was 89.  His lymphocyte percentage was slightly high at 55, and his neutrophil percentage was low at 38.  Previous to that on 04/14/2013, his white cell count was 1.9, his hemoglobin was 7.9 and his platelet counts were normal at 230.  He was evaluated yesterday on 04/24/2013 by Dr. Randa Evens, and a repeat laboratory testing showed that his white cell count was 2.7, hemoglobin was 8.7.  His MCV at 90, RDW at 18.9.  His neutrophil percentage slightly low at 41.  His lymphocyte percentage at 50.6, slightly high.  Iron studies showed iron level was 40 which was low; saturation was 12, which was low.  Ferritin normal at 58.  His reticulocyte count was 2.3 and vitamin B12 was 387 and folic acid was 10.  He was advised to start Pentasa as well as iron supplements and was referred to me for the evaluation of his hematological problems. Clinically, he reports increased symptoms of fatigue, tiredness, dyspnea on exertion at times, but still is able to be active.  He works part- time and remains able to perform most activities of daily living.  He is not  reporting any chest pain or shortness of breath.  He had not reported any hematochezia or melena.  He did have a CT scan on 04/18/2013, which showed no evidence of any abdominal masses but changes suggestive of chronic Crohn disease in terminal ileum.  Soft tissue stranding and prominent lymph node within the small bowel mesentery appears to be reactive, but there is really no evidence to suggest splenomegaly or diffuse lymphadenopathy.  He had not reported any headaches, blurry vision, double vision, or any neurological symptoms. Does not report any chest pain, shortness of breath, difficulty breathing.  Does not report any cough or hemoptysis or hematemesis. Does not report any nausea or vomiting.  Does report some occasional abdominal discomfort and fullness.  Does not report any hematochezia. Does not report any melena.  Does not report any frequency, urgency, or hesitancy.  Does not report any musculoskeletal complaints.  Does not report any arthralgias, myalgias, heat or cold intolerance.  The rest of the review of systems is otherwise unremarkable.  PAST MEDICAL HISTORY:  Significant for Crohn disease, hypertension, atrial fibrillation, history of heart murmur, degenerative arthritis, diverticulosis, and nephrolithiasis.  He is also status post L5-S1 fusion.  He has also had endoscopy and virtual colonoscopy in the past.  MEDICATION:  He is on losartan, verapamil, omeprazole, finasteride, Claritin, aspirin, and he is to start Pentasa and iron supplements which he has not done so yet.  SOCIAL HISTORY:  He is married.  He has 3 children.  Denied any alcohol or tobacco abuse.  He retired from the Therap[employer], art.  He also was in the [employer] in the [place] [employer] in the early 60s.  ALLERGIES:  Allergic to Toprol, Maxzide.  FAMILY HISTORY:  His mother had colon cancer, but otherwise, no other history of malignancies or blood disorders.  PHYSICAL EXAMINATION:   Alert, awake gentleman, appeared in no active distress today.  Blood pressure is 156/84, pulse 69, respirations 20, weighs 234 pounds.  HEENT:  Head is normocephalic, atraumatic.  Pupils equal, round, reactive to light.  Oral mucosa moist and pink.  Neck: Supple.  No lymphadenopathy.  Heart:  Regular rate and rhythm.  S1, S2. Lungs:  Clear to auscultation.  No rhonchi, wheeze, dullness, to percussion.  Abdomen:  Soft, nontender.  No hepatosplenomegaly. Extremities:  No clubbing, cyanosis, or edema.  Neurologic:  Intact, motor sensory and deep tendon reflexes.  LABORATORY DATA:  Showed a hemoglobin of 8.6, white cell count of 1.8, platelet count of 230.  His MCV is 93.  His RDW at 18.  Neutrophil percentage again __________ is 39.  His lymphocytes slightly elevated at 55.  Peripheral smear was personally reviewed today and showed a clear- cut evidence of hypochromia and microcytosis.  The white cells appeared decreased in number, lymphocytes appear slightly abnormal but I could not appreciate any blast or any dysplasia.  ASSESSMENT AND PLAN:  A 72 year old gentleman with the following issues, 1. Microcytic anemia in the setting of chronic Crohn disease.  There     is a clear-cut evidence of iron deficiency in this gentleman, which     I think is probably related to chronic blood loss from his Crohn     disease and possible diverticulosis.  He is following up with Dr.     Randa Evens regarding that.  He has had virtual colonoscopy.  He had     the age-appropriate cancer screening including abdominal imaging     that did not show any clear-cut masses.  I do not think his anemia     is related to other hematological disorder at this time.  I think     this is predominantly related to his iron deficiency.  In terms of     replacing his iron, I have discussed with him the risks and     benefits of oral iron versus IV iron; and at the time being, I     agree with oral iron supplementation and  recheck his counts in 2-3     months and if it is inadequate, we can certainly consider IV iron. 2. Leukocytopenia, slight lymphocytosis and neutropenia.  Differential     diagnosis discussed today with Mr. Hurn.  This could be related     to a lymphoproliferative disorder such as CLL, mantle cell     lymphoma, hairy cell leukemia or could be a reactive finding.  His     CT scan images do not suggest any lymphoproliferative disorder, but     I will send his blood for flow cytometry to evaluate that in any     case.  I have also talked to him about a possible bone marrow     biopsy if there is anything to suggest a lymphoproliferative     disorder.  I have also discussed with him the possibility that this     could also be related to an autoimmune  phenomenon; given his     history of Crohn disease, he could be susceptible to autoimmune     neutropenia, and it is not terribly uncommon to be seen in     inflammatory bowel diseases.  Either way, I will await the results     of the flow cytometry, and I will follow him closely regarding that     in the future.  All his questions were answered today.    ______________________________ Benjiman Core, M.D. FNS/MEDQ  D:  04/25/2013  T:  04/25/2013  Job:  [id number]

## 2013-07-22 ENCOUNTER — Telehealth: Payer: Self-pay | Admitting: *Deleted

## 2013-07-22 ENCOUNTER — Telehealth: Payer: Self-pay | Admitting: Oncology

## 2013-07-22 ENCOUNTER — Ambulatory Visit (HOSPITAL_BASED_OUTPATIENT_CLINIC_OR_DEPARTMENT_OTHER): Payer: Medicare Other | Admitting: Oncology

## 2013-07-22 ENCOUNTER — Encounter: Payer: Self-pay | Admitting: Oncology

## 2013-07-22 ENCOUNTER — Other Ambulatory Visit: Payer: Medicare Other

## 2013-07-22 VITALS — BP 131/73 | HR 73 | Temp 99.3°F | Resp 18 | Ht 69.0 in | Wt 229.3 lb

## 2013-07-22 DIAGNOSIS — D500 Iron deficiency anemia secondary to blood loss (chronic): Secondary | ICD-10-CM

## 2013-07-22 DIAGNOSIS — D709 Neutropenia, unspecified: Secondary | ICD-10-CM

## 2013-07-22 DIAGNOSIS — D509 Iron deficiency anemia, unspecified: Secondary | ICD-10-CM

## 2013-07-22 NOTE — Telephone Encounter (Signed)
gave pt aoot for May 2015 lab and MD , emailed michelle regarding IV Iron for Monday

## 2013-07-22 NOTE — Telephone Encounter (Signed)
Per staff message and POF I have scheduled appts.  JMW  

## 2013-07-22 NOTE — Progress Notes (Signed)
Hematology and Oncology Follow Up Visit  [name] [id number] 10/15/1940 73 y.o. 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 He has also iron deficiency anemia. As was diagnosed in December of 2014.   Prior Therapy: He status post 3 months of oral iron replacement without much improvement.  Current therapy: He is under evaluation for possible IV iron.  Interim History:  [name] presents today for a followup visit. He is a pleasant gentleman Crohn's disease I saw on December 5 thousand and 14 for the evaluation of neutropenia and anemia. His workup revealed that he has low iron as well as low iron saturation and his flow cytometry did not reveal any evidence of lymphoproliferative disorder. He has been on oral iron supplements which have not really helped him at this time. He has had problems with dyspepsia, diarrhea and occasional abdominal pain related to iron supplements. He has not reported any hematochezia or melena. Has not reported any nausea or vomiting at this point. His title lose weight but no constitutional symptoms of fevers or chills or sweats.  Medications: I have reviewed the patient's current medications.  Current Outpatient Prescriptions  Medication Sig Dispense Refill  . finasteride (PROSCAR) 5 MG tablet Take 5 mg by mouth daily.      Marland Kitchen losartan (COZAAR) 50 MG tablet Take 50 mg by mouth daily.      Marland Kitchen omeprazole (PRILOSEC) 20 MG capsule Take 20 mg by mouth daily.      . verapamil (CALAN-SR) 240 MG CR tablet Take 240 mg by mouth daily.       No current facility-administered medications for this visit.     Allergies: Not on File  Past Medical History, Surgical history, Social history, and Family History were reviewed and updated.  Review of Systems: Constitutional:  Negative for fever,  chills, night sweats, anorexia, weight loss, pain. Cardiovascular: no chest pain or dyspnea on exertion Respiratory: no cough, shortness of breath, or wheezing Neurological: no TIA or stroke symptoms Dermatological: negative ENT: negative Skin: Negative. Gastrointestinal: no abdominal pain, change in bowel habits, or black or bloody stools Genito-Urinary: no dysuria, trouble voiding, or hematuria Hematological and Lymphatic: negative Breast: negative Musculoskeletal: negative Remaining ROS negative. Physical Exam: Blood pressure 131/73, pulse 73, temperature 99.3 F (37.4 C), temperature source Oral, resp. rate 18, height 5' 9"  (1.753 m), weight 229 lb 4.8 oz (104.01 kg), SpO2 100.00%. ECOG: 1 General appearance: alert, cooperative and appears stated age Head: Normocephalic, without obvious abnormality, atraumatic Neck: no adenopathy, no carotid bruit, no JVD, supple, symmetrical, trachea midline and thyroid not enlarged, symmetric, no tenderness/mass/nodules Lymph nodes: Cervical, supraclavicular, and axillary nodes normal. Heart:regular rate and rhythm, S1, S2 normal, no murmur, click, rub or gallop Lung:chest clear, no wheezing, rales, normal symmetric air entry Abdomin: soft, non-tender, without masses or organomegaly EXT:no erythema, induration, or nodules   Labs obtained at his primary care physician and the [facility] clinic were reviewed today. His hemoglobin on 06/16/2013 was 9.3 with a white cell count of 1.4. His absolute neutrophil count is around 500. His platelet count is 173. He has normal calcium levels as well as creatinine and albumin. He has normal liver function tests. His Fe level is 53 which still low in iron saturation of 78% which certainly low.  Impression and Plan:  73 year old gentleman with the following issues:  1. Iron  deficiency anemia: the etiology of that is probably related to his inflammatory bowel disease. His oral iron supplements were poorly tolerated  and have not really helped his hemoglobin dramatically. Risks and benefits of IV iron supplementation were discussed today. Complication of Feraheme infusion were discussed including infusion-related toxicity, arthralgias myalgias as well as more serious side effects such as anaphylaxis which is rare. He is agreeable to proceed and will assess his blood count after 6-8 weeks.  2. Neutropenia: He is a flow cytometry did not reveal any evidence of lymphoproliferative disorder. This could be related to an autoimmune phenomenon and we will continue to monitor him closely. If there is no improvement after correcting his iron we will consider a bone marrow biopsy.   [facility], MD 3/17/20159:52 AM

## 2013-07-26 ENCOUNTER — Telehealth: Payer: Self-pay | Admitting: Oncology

## 2013-07-26 NOTE — Telephone Encounter (Signed)
Called pt reminded of IViron on 2/23 and May appt lab and MD

## 2013-07-28 ENCOUNTER — Ambulatory Visit (HOSPITAL_BASED_OUTPATIENT_CLINIC_OR_DEPARTMENT_OTHER): Payer: Medicare Other

## 2013-07-28 VITALS — BP 152/66 | HR 68 | Temp 98.5°F | Resp 20

## 2013-07-28 DIAGNOSIS — D509 Iron deficiency anemia, unspecified: Secondary | ICD-10-CM

## 2013-07-28 DIAGNOSIS — D500 Iron deficiency anemia secondary to blood loss (chronic): Secondary | ICD-10-CM

## 2013-07-28 MED ORDER — SODIUM CHLORIDE 0.9 % IV SOLN
1020.0000 mg | Freq: Once | INTRAVENOUS | Status: AC
  Administered 2013-07-28: 1020 mg via INTRAVENOUS
  Filled 2013-07-28: qty 34

## 2013-07-28 MED ORDER — SODIUM CHLORIDE 0.9 % IV SOLN
Freq: Once | INTRAVENOUS | Status: AC
  Administered 2013-07-28: 15:00:00 via INTRAVENOUS

## 2013-07-28 NOTE — Patient Instructions (Signed)
Ferumoxytol injection What is this medicine? FERUMOXYTOL is an iron complex. Iron is used to make healthy red blood cells, which carry oxygen and nutrients throughout the body. This medicine is used to treat iron deficiency anemia in people with chronic kidney disease. This medicine may be used for other purposes; ask your health care provider or pharmacist if you have questions. COMMON BRAND NAME(S): Feraheme  What should I tell my health care provider before I take this medicine? They need to know if you have any of these conditions: -anemia not caused by low iron levels -high levels of iron in the blood -magnetic resonance imaging (MRI) test scheduled -an unusual or allergic reaction to iron, other medicines, foods, dyes, or preservatives -pregnant or trying to get pregnant -breast-feeding How should I use this medicine? This medicine is for injection into a vein. It is given by a health care professional in a hospital or clinic setting. Talk to your pediatrician regarding the use of this medicine in children. Special care may be needed. Overdosage: If you think you've taken too much of this medicine contact a poison control center or emergency room at once. Overdosage: If you think you have taken too much of this medicine contact a poison control center or emergency room at once. NOTE: This medicine is only for you. Do not share this medicine with others. What if I miss a dose? It is important not to miss your dose. Call your doctor or health care professional if you are unable to keep an appointment. What may interact with this medicine? This medicine may interact with the following medications: -other iron products This list may not describe all possible interactions. Give your health care provider a list of all the medicines, herbs, non-prescription drugs, or dietary supplements you use. Also tell them if you smoke, drink alcohol, or use illegal drugs. Some items may interact with your  medicine. What should I watch for while using this medicine? Visit your doctor or healthcare professional regularly. Tell your doctor or healthcare professional if your symptoms do not start to get better or if they get worse. You may need blood work done while you are taking this medicine. You may need to follow a special diet. Talk to your doctor. Foods that contain iron include: whole grains/cereals, dried fruits, beans, or peas, leafy green vegetables, and organ meats (liver, kidney). What side effects may I notice from receiving this medicine? Side effects that you should report to your doctor or health care professional as soon as possible: -allergic reactions like skin rash, itching or hives, swelling of the face, lips, or tongue -breathing problems -changes in blood pressure -feeling faint or lightheaded, falls -fever or chills -flushing, sweating, or hot feelings -swelling of the ankles or feet Side effects that usually do not require medical attention (Report these to your doctor or health care professional if they continue or are bothersome.): -diarrhea -headache -nausea, vomiting -stomach pain This list may not describe all possible side effects. Call your doctor for medical advice about side effects. You may report side effects to FDA at 1-800-FDA-1088. Where should I keep my medicine? This drug is given in a hospital or clinic and will not be stored at home. NOTE: This sheet is a summary. It may not cover all possible information. If you have questions about this medicine, talk to your doctor, pharmacist, or health care provider.  2014, Elsevier/Gold Standard. (2011-12-08 15:23:36)  Iron-Rich Diet An iron-rich diet contains foods that are good sources of  iron. Iron is an important mineral that helps your body produce hemoglobin. Hemoglobin is a protein in red blood cells that carries oxygen to the body's tissues. Sometimes, the iron level in your blood can be low. This may be  caused by:  A lack of iron in your diet.  Blood loss.  Times of growth, such as during pregnancy or during a child's growth and development. Low levels of iron can cause a decrease in the number of red blood cells. This can result in iron deficiency anemia. Iron deficiency anemia symptoms include:  Tiredness.  Weakness.  Irritability.  Increased chance of infection. Here are some recommendations for daily iron intake:  Males older than 74 years of age need 8 mg of iron per day.  Women ages 67 to 54 need 18 mg of iron per day.  Pregnant women need 27 mg of iron per day, and women who are over 87 years of age and breastfeeding need 9 mg of iron per day.  Women over the age of 39 need 8 mg of iron per day. SOURCES OF IRON There are 2 types of iron that are found in food: heme iron and nonheme iron. Heme iron is absorbed by the body better than nonheme iron. Heme iron is found in meat, poultry, and fish. Nonheme iron is found in grains, beans, and vegetables. Heme Iron Sources Food / Iron (mg)  Chicken liver, 3 oz (85 g)/ 10 mg  Beef liver, 3 oz (85 g)/ 5.5 mg  Oysters, 3 oz (85 g)/ 8 mg  Beef, 3 oz (85 g)/ 2 to 3 mg  Shrimp, 3 oz (85 g)/ 2.8 mg  [place], 3 oz (85 g)/ 2 mg  Chicken, 3 oz (85 g) / 1 mg  Fish (tuna, halibut), 3 oz (85 g)/ 1 mg  Pork, 3 oz (85 g)/ 0.9 mg Nonheme Iron Sources Food / Iron (mg)  Ready-to-eat breakfast cereal, iron-fortified / 3.9 to 7 mg  Tofu,  cup / 3.4 mg  Kidney beans,  cup / 2.6 mg  Baked potato with skin / 2.7 mg  Asparagus,  cup / 2.2 mg  Avocado / 2 mg  Dried peaches,  cup / 1.6 mg  Raisins,  cup / 1.5 mg  Soy milk, 1 cup / 1.5 mg  Whole-wheat bread, 1 slice / 1.2 mg  Spinach, 1 cup / 0.8 mg  Broccoli,  cup / 0.6 mg IRON ABSORPTION Certain foods can decrease the body's absorption of iron. Try to avoid these foods and beverages while eating meals with iron-containing  foods:  Coffee.  Tea.  Fiber.  Soy. Foods containing vitamin C can help increase the amount of iron your body absorbs from iron sources, especially from nonheme sources. Eat foods with vitamin C along with iron-containing foods to increase your iron absorption. Foods that are high in vitamin C include many fruits and vegetables. Some good sources are:  Fresh orange juice.  Oranges.  Strawberries.  Mangoes.  Grapefruit.  Red bell peppers.  Green bell peppers.  Broccoli.  Potatoes with skin.  Tomato juice. Document Released: 12/06/2004 Document Revised: 07/17/2011 Document Reviewed: 10/13/2010 [facility] Patient Information 2014 [place], [place].

## 2013-09-23 ENCOUNTER — Encounter: Payer: Self-pay | Admitting: Oncology

## 2013-09-23 ENCOUNTER — Telehealth: Payer: Self-pay | Admitting: Oncology

## 2013-09-23 ENCOUNTER — Other Ambulatory Visit (HOSPITAL_BASED_OUTPATIENT_CLINIC_OR_DEPARTMENT_OTHER): Payer: Medicare Other

## 2013-09-23 ENCOUNTER — Ambulatory Visit (HOSPITAL_BASED_OUTPATIENT_CLINIC_OR_DEPARTMENT_OTHER): Payer: Medicare Other | Admitting: Oncology

## 2013-09-23 VITALS — BP 137/70 | HR 87 | Temp 98.3°F | Resp 20 | Ht 69.0 in | Wt 209.3 lb

## 2013-09-23 DIAGNOSIS — D509 Iron deficiency anemia, unspecified: Secondary | ICD-10-CM

## 2013-09-23 DIAGNOSIS — D500 Iron deficiency anemia secondary to blood loss (chronic): Secondary | ICD-10-CM

## 2013-09-23 DIAGNOSIS — K5090 Crohn's disease, unspecified, without complications: Secondary | ICD-10-CM

## 2013-09-23 DIAGNOSIS — K921 Melena: Secondary | ICD-10-CM

## 2013-09-23 DIAGNOSIS — D649 Anemia, unspecified: Secondary | ICD-10-CM

## 2013-09-23 DIAGNOSIS — D709 Neutropenia, unspecified: Secondary | ICD-10-CM

## 2013-09-23 LAB — IRON AND TIBC CHCC
%SAT: 23 % (ref 20–55)
Iron: 40 ug/dL — ABNORMAL LOW (ref 42–163)
TIBC: 176 ug/dL — ABNORMAL LOW (ref 202–409)
UIBC: 137 ug/dL (ref 117–376)

## 2013-09-23 LAB — CBC WITH DIFFERENTIAL/PLATELET
BASO%: 0 % (ref 0.0–2.0)
Basophils Absolute: 0 10*3/uL (ref 0.0–0.1)
EOS%: 0 % (ref 0.0–7.0)
Eosinophils Absolute: 0 10*3/uL (ref 0.0–0.5)
HCT: 26.1 % — ABNORMAL LOW (ref 38.4–49.9)
HEMOGLOBIN: 7.4 g/dL — AB (ref 13.0–17.1)
LYMPH#: 0.5 10*3/uL — AB (ref 0.9–3.3)
LYMPH%: 66.2 % — ABNORMAL HIGH (ref 14.0–49.0)
MCH: 27.5 pg (ref 27.2–33.4)
MCHC: 28.4 g/dL — AB (ref 32.0–36.0)
MCV: 97 fL (ref 79.3–98.0)
MONO#: 0 10*3/uL — ABNORMAL LOW (ref 0.1–0.9)
MONO%: 4.4 % (ref 0.0–14.0)
NEUT#: 0.2 10*3/uL — CL (ref 1.5–6.5)
NEUT%: 29.4 % — AB (ref 39.0–75.0)
Platelets: 277 10*3/uL (ref 140–400)
RBC: 2.69 10*6/uL — ABNORMAL LOW (ref 4.20–5.82)
RDW: 18.9 % — AB (ref 11.0–14.6)
WBC: 0.7 10*3/uL — CL (ref 4.0–10.3)

## 2013-09-23 LAB — COMPREHENSIVE METABOLIC PANEL (CC13)
ALT: 8 U/L (ref 0–55)
AST: 7 U/L (ref 5–34)
Albumin: 2.6 g/dL — ABNORMAL LOW (ref 3.5–5.0)
Alkaline Phosphatase: 49 U/L (ref 40–150)
Anion Gap: 9 mEq/L (ref 3–11)
BUN: 12.1 mg/dL (ref 7.0–26.0)
CO2: 26 mEq/L (ref 22–29)
CREATININE: 0.9 mg/dL (ref 0.7–1.3)
Calcium: 8.5 mg/dL (ref 8.4–10.4)
Chloride: 104 mEq/L (ref 98–109)
Glucose: 104 mg/dl (ref 70–140)
POTASSIUM: 3.6 meq/L (ref 3.5–5.1)
Sodium: 140 mEq/L (ref 136–145)
Total Bilirubin: 1.03 mg/dL (ref 0.20–1.20)
Total Protein: 5.9 g/dL — ABNORMAL LOW (ref 6.4–8.3)

## 2013-09-23 LAB — FERRITIN CHCC: Ferritin: 226 ng/ml (ref 22–316)

## 2013-09-23 LAB — HOLD TUBE, BLOOD BANK

## 2013-09-23 NOTE — Progress Notes (Signed)
Hematology and Oncology Follow Up Visit  [name] [id number] 1940-10-25 73 y.o. 09/23/2013 8:49 AM [name], [name], Eric [place], MD   Principle Diagnosis: 73 year old gentleman with neutropenia and mild anemia  with history of inflammatory bowel disease. He has also iron deficiency anemia. As was diagnosed in December of 2014. 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   Prior Therapy: He is S/P IV iron in the form of Feraheme for a total of 1 g given in March of 2015.  Current therapy: Under evaluation for his neutropenia and anemia.  Interim History:  [name] presents today for a followup visit. He is a pleasant gentleman Crohn's disease. His workup revealed that he has low iron as well as low iron saturation and his flow cytometry did not reveal any evidence of lymphoproliferative disorder. He tolerated IV iron 2 months ago without any complications. He felt better afterwards and was able to have improvement in his activity levels. 2 weeks ago, he developed symptoms of nausea, vomiting abdominal pain and hematochezia. He was diagnosed with Crohn's flare up and his prednisone was increased to 40 mg daily. His symptoms are slowly improving after increasing his prednisone. As mentioned, he have had microscopic hematochezia but most recently have had visible bleeding with bowel movements. He did not report any headaches blurred vision or double vision. Did not report any fevers or chills or sweats. Did not report any genitourinary complaints. That report any neurological symptoms. Is not reporting any chest pain or palpitations. Rest of his review of system is unremarkable.  Medications: I have reviewed the patient's current medications.  Current Outpatient Prescriptions  Medication Sig Dispense Refill  . finasteride (PROSCAR) 5 MG tablet Take 5 mg by mouth daily.      Marland Kitchen losartan (COZAAR) 50 MG tablet Take 50 mg by mouth daily.      .  metroNIDAZOLE (FLAGYL) 250 MG tablet       . omeprazole (PRILOSEC) 20 MG capsule Take 20 mg by mouth daily.      . predniSONE (DELTASONE) 5 MG tablet       . verapamil (CALAN-SR) 240 MG CR tablet Take 240 mg by mouth daily.       No current facility-administered medications for this visit.        Remaining ROS negative. Physical Exam: Blood pressure 137/70, pulse 87, temperature 98.3 F (36.8 C), temperature source Oral, resp. rate 20, height 5' 9"  (1.753 m), weight 209 lb 4.8 oz (94.938 kg). ECOG: 1 General appearance: alert, cooperative and appears stated age Head: Normocephalic, without obvious abnormality, atraumatic Neck: no adenopathy, no carotid bruit, no JVD, supple, symmetrical, trachea midline and thyroid not enlarged, symmetric, no tenderness/mass/nodules Lymph nodes: Cervical, supraclavicular, and axillary nodes normal. Heart:regular rate and rhythm, S1, S2 normal, no murmur, click, rub or gallop Lung:chest clear, no wheezing, rales, normal symmetric air entry Abdomin: soft, non-tender, without masses or organomegaly EXT:no erythema, induration, or nodules   CBC    Component Value Date/Time   WBC 0.7* 09/23/2013 0811   RBC 2.69* 09/23/2013 0811   HGB 7.4* 09/23/2013 0811   HCT 26.1* 09/23/2013 0811   PLT 277 09/23/2013 0811   MCV 97.0 09/23/2013 0811   MCH 27.5 09/23/2013 0811   MCHC 28.4* 09/23/2013 0811   RDW 18.9* 09/23/2013 0811   LYMPHSABS 0.5* 09/23/2013 0811   MONOABS 0.0* 09/23/2013 0811   EOSABS 0.0 09/23/2013 0811   BASOSABS 0.0 09/23/2013 0811     Impression and Plan:  73 year old gentleman with the following issues:  1. Iron deficiency anemia: the etiology of that is probably related to his inflammatory bowel disease. He is status post IV iron in March of 2015 with significant improvement in his symptoms. But he developed hematochezia and bleeding which have exacerbated his hemoglobin loss again.  Risks and benefits of IV iron supplementation were  discussed today again. Complication of Feraheme infusion were discussed including infusion-related toxicity, arthralgias myalgias as well as more serious side effects such as anaphylaxis which is rare. I feel that a repeat IV iron might be helpful at this time. We will set that appointment in the near future.  2. Neutropenia: He is a flow cytometry did not reveal any evidence of lymphoproliferative disorder. The differential diagnosis will certainly include a bone marrow disorder such as myelodysplastic syndrome or any other bone marrow disorders. I explained to him that I need to do a bone marrow biopsy to determine that effectively. Risks and benefits of this procedure were discussed today in detail. He is agreeable to proceed but will like to put that off till his episode of Crohn's flare settles down. We'll probably set that up for him in the next couple months.  3. Crohn's flare and hematochezia: He is scheduled to have a colonoscopy in the near future which have no objections to having done.   Eric Portela, MD 5/19/20158:49 AM

## 2013-09-23 NOTE — Telephone Encounter (Signed)
gv adn printed aptps ched and avs for pt for May adn July

## 2013-09-24 ENCOUNTER — Ambulatory Visit (HOSPITAL_BASED_OUTPATIENT_CLINIC_OR_DEPARTMENT_OTHER): Payer: Medicare Other

## 2013-09-24 VITALS — BP 136/80 | HR 71 | Temp 98.8°F | Resp 18

## 2013-09-24 DIAGNOSIS — K5090 Crohn's disease, unspecified, without complications: Secondary | ICD-10-CM

## 2013-09-24 DIAGNOSIS — D500 Iron deficiency anemia secondary to blood loss (chronic): Secondary | ICD-10-CM

## 2013-09-24 MED ORDER — SODIUM CHLORIDE 0.9 % IV SOLN
1020.0000 mg | Freq: Once | INTRAVENOUS | Status: AC
  Administered 2013-09-24: 1020 mg via INTRAVENOUS
  Filled 2013-09-24: qty 34

## 2013-09-24 MED ORDER — SODIUM CHLORIDE 0.9 % IV SOLN
Freq: Once | INTRAVENOUS | Status: AC
  Administered 2013-09-24: 08:00:00 via INTRAVENOUS

## 2013-09-24 NOTE — Patient Instructions (Signed)

## 2013-09-26 ENCOUNTER — Encounter: Payer: Self-pay | Admitting: *Deleted

## 2013-09-26 NOTE — Progress Notes (Signed)
[name] from dr Jeneen Rinks [facility] office calling to ask what dr Hazeline Junker plans are for patient's low wbc? Per dr Alen Blew, he has discussed this with the patient. last office visit note was faxed to dr edwards office.

## 2013-09-30 ENCOUNTER — Inpatient Hospital Stay (HOSPITAL_COMMUNITY)
Admission: AD | Admit: 2013-09-30 | Discharge: 2013-10-06 | DRG: 385 | Disposition: A | Discharge status: Home / Self Care | Payer: Medicare Other | Source: Ambulatory Visit | Attending: Internal Medicine | Admitting: Internal Medicine

## 2013-09-30 ENCOUNTER — Inpatient Hospital Stay (HOSPITAL_COMMUNITY): Payer: Medicare Other

## 2013-09-30 ENCOUNTER — Encounter (HOSPITAL_COMMUNITY): Payer: Self-pay | Admitting: Internal Medicine

## 2013-09-30 DIAGNOSIS — K219 Gastro-esophageal reflux disease without esophagitis: Secondary | ICD-10-CM | POA: Diagnosis present

## 2013-09-30 DIAGNOSIS — Z9849 Cataract extraction status, unspecified eye: Secondary | ICD-10-CM

## 2013-09-30 DIAGNOSIS — J9819 Other pulmonary collapse: Secondary | ICD-10-CM | POA: Diagnosis present

## 2013-09-30 DIAGNOSIS — D759 Disease of blood and blood-forming organs, unspecified: Secondary | ICD-10-CM | POA: Diagnosis present

## 2013-09-30 DIAGNOSIS — N390 Urinary tract infection, site not specified: Secondary | ICD-10-CM | POA: Diagnosis present

## 2013-09-30 DIAGNOSIS — B964 Proteus (mirabilis) (morganii) as the cause of diseases classified elsewhere: Secondary | ICD-10-CM | POA: Diagnosis present

## 2013-09-30 DIAGNOSIS — D500 Iron deficiency anemia secondary to blood loss (chronic): Secondary | ICD-10-CM

## 2013-09-30 DIAGNOSIS — R627 Adult failure to thrive: Secondary | ICD-10-CM | POA: Diagnosis present

## 2013-09-30 DIAGNOSIS — K5732 Diverticulitis of large intestine without perforation or abscess without bleeding: Secondary | ICD-10-CM | POA: Diagnosis present

## 2013-09-30 DIAGNOSIS — Z515 Encounter for palliative care: Secondary | ICD-10-CM

## 2013-09-30 DIAGNOSIS — D638 Anemia in other chronic diseases classified elsewhere: Secondary | ICD-10-CM | POA: Diagnosis present

## 2013-09-30 DIAGNOSIS — K5000 Crohn's disease of small intestine without complications: Principal | ICD-10-CM | POA: Diagnosis present

## 2013-09-30 DIAGNOSIS — N400 Enlarged prostate without lower urinary tract symptoms: Secondary | ICD-10-CM

## 2013-09-30 DIAGNOSIS — D72819 Decreased white blood cell count, unspecified: Secondary | ICD-10-CM

## 2013-09-30 DIAGNOSIS — K819 Cholecystitis, unspecified: Secondary | ICD-10-CM | POA: Diagnosis present

## 2013-09-30 DIAGNOSIS — R5383 Other fatigue: Secondary | ICD-10-CM

## 2013-09-30 DIAGNOSIS — R634 Abnormal weight loss: Secondary | ICD-10-CM | POA: Diagnosis present

## 2013-09-30 DIAGNOSIS — E43 Unspecified severe protein-calorie malnutrition: Secondary | ICD-10-CM

## 2013-09-30 DIAGNOSIS — Z800 Family history of malignant neoplasm of digestive organs: Secondary | ICD-10-CM

## 2013-09-30 DIAGNOSIS — Z6828 Body mass index (BMI) 28.0-28.9, adult: Secondary | ICD-10-CM

## 2013-09-30 DIAGNOSIS — Z823 Family history of stroke: Secondary | ICD-10-CM

## 2013-09-30 DIAGNOSIS — I10 Essential (primary) hypertension: Secondary | ICD-10-CM

## 2013-09-30 DIAGNOSIS — D709 Neutropenia, unspecified: Secondary | ICD-10-CM | POA: Diagnosis present

## 2013-09-30 DIAGNOSIS — K56609 Unspecified intestinal obstruction, unspecified as to partial versus complete obstruction: Secondary | ICD-10-CM | POA: Diagnosis present

## 2013-09-30 DIAGNOSIS — Z8249 Family history of ischemic heart disease and other diseases of the circulatory system: Secondary | ICD-10-CM

## 2013-09-30 DIAGNOSIS — J309 Allergic rhinitis, unspecified: Secondary | ICD-10-CM | POA: Diagnosis present

## 2013-09-30 DIAGNOSIS — D47Z9 Other specified neoplasms of uncertain behavior of lymphoid, hematopoietic and related tissue: Secondary | ICD-10-CM | POA: Diagnosis present

## 2013-09-30 DIAGNOSIS — G609 Hereditary and idiopathic neuropathy, unspecified: Secondary | ICD-10-CM | POA: Diagnosis present

## 2013-09-30 DIAGNOSIS — C9200 Acute myeloblastic leukemia, not having achieved remission: Secondary | ICD-10-CM

## 2013-09-30 DIAGNOSIS — R5381 Other malaise: Secondary | ICD-10-CM | POA: Diagnosis present

## 2013-09-30 DIAGNOSIS — D509 Iron deficiency anemia, unspecified: Secondary | ICD-10-CM

## 2013-09-30 DIAGNOSIS — Z8041 Family history of malignant neoplasm of ovary: Secondary | ICD-10-CM

## 2013-09-30 DIAGNOSIS — Z79899 Other long term (current) drug therapy: Secondary | ICD-10-CM

## 2013-09-30 DIAGNOSIS — D61818 Other pancytopenia: Secondary | ICD-10-CM

## 2013-09-30 DIAGNOSIS — IMO0002 Reserved for concepts with insufficient information to code with codable children: Secondary | ICD-10-CM

## 2013-09-30 DIAGNOSIS — Z888 Allergy status to other drugs, medicaments and biological substances status: Secondary | ICD-10-CM

## 2013-09-30 DIAGNOSIS — K5090 Crohn's disease, unspecified, without complications: Secondary | ICD-10-CM

## 2013-09-30 DIAGNOSIS — R531 Weakness: Secondary | ICD-10-CM

## 2013-09-30 DIAGNOSIS — Z87442 Personal history of urinary calculi: Secondary | ICD-10-CM

## 2013-09-30 HISTORY — DX: Iron deficiency anemia, unspecified: D50.9

## 2013-09-30 HISTORY — DX: Calculus of kidney: N20.0

## 2013-09-30 HISTORY — DX: Polyneuropathy, unspecified: G62.9

## 2013-09-30 HISTORY — DX: Essential (primary) hypertension: I10

## 2013-09-30 HISTORY — DX: Decreased white blood cell count, unspecified: D72.819

## 2013-09-30 HISTORY — DX: Benign prostatic hyperplasia without lower urinary tract symptoms: N40.0

## 2013-09-30 HISTORY — DX: Crohn's disease, unspecified, without complications: K50.90

## 2013-09-30 LAB — TSH: TSH: 0.786 u[IU]/mL (ref 0.350–4.500)

## 2013-09-30 LAB — CBC WITH DIFFERENTIAL/PLATELET
BASOS ABS: 0 10*3/uL (ref 0.0–0.1)
BASOS PCT: 0 % (ref 0–1)
Eosinophils Absolute: 0 10*3/uL (ref 0.0–0.7)
Eosinophils Relative: 0 % (ref 0–5)
HEMATOCRIT: 30.1 % — AB (ref 39.0–52.0)
HEMOGLOBIN: 8.9 g/dL — AB (ref 13.0–17.0)
Lymphocytes Relative: 46 % (ref 12–46)
Lymphs Abs: 0.4 10*3/uL — ABNORMAL LOW (ref 0.7–4.0)
MCH: 27.8 pg (ref 26.0–34.0)
MCHC: 29.6 g/dL — ABNORMAL LOW (ref 30.0–36.0)
MCV: 94.1 fL (ref 78.0–100.0)
MONOS PCT: 15 % — AB (ref 3–12)
Monocytes Absolute: 0.1 10*3/uL (ref 0.1–1.0)
Neutro Abs: 0.4 10*3/uL — ABNORMAL LOW (ref 1.7–7.7)
Neutrophils Relative %: 39 % — ABNORMAL LOW (ref 43–77)
Platelets: 306 10*3/uL (ref 150–400)
RBC: 3.2 MIL/uL — AB (ref 4.22–5.81)
RDW: 18.3 % — AB (ref 11.5–15.5)
WBC: 0.9 10*3/uL — CL (ref 4.0–10.5)

## 2013-09-30 LAB — COMPREHENSIVE METABOLIC PANEL
ALBUMIN: 2.5 g/dL — AB (ref 3.5–5.2)
ALT: 42 U/L (ref 0–53)
AST: 29 U/L (ref 0–37)
Alkaline Phosphatase: 121 U/L — ABNORMAL HIGH (ref 39–117)
BUN: 19 mg/dL (ref 6–23)
CALCIUM: 8.7 mg/dL (ref 8.4–10.5)
CO2: 26 mEq/L (ref 19–32)
Chloride: 95 mEq/L — ABNORMAL LOW (ref 96–112)
Creatinine, Ser: 0.98 mg/dL (ref 0.50–1.35)
GFR calc Af Amer: >90 mL/min (ref >90)
GFR calc non Af Amer: 79 mL/min — ABNORMAL LOW (ref >90)
Glucose, Bld: 114 mg/dL — ABNORMAL HIGH (ref 70–99)
Potassium: 4.3 mEq/L (ref 3.7–5.3)
Sodium: 134 mEq/L — ABNORMAL LOW (ref 137–147)
TOTAL PROTEIN: 6.4 g/dL (ref 6.0–8.3)
Total Bilirubin: 1.4 mg/dL — ABNORMAL HIGH (ref 0.3–1.2)

## 2013-09-30 LAB — MAGNESIUM: Magnesium: 2 mg/dL (ref 1.5–2.5)

## 2013-09-30 LAB — URINALYSIS, ROUTINE W REFLEX MICROSCOPIC
Glucose, UA: NEGATIVE mg/dL
Hgb urine dipstick: NEGATIVE
KETONES UR: NEGATIVE mg/dL
Nitrite: NEGATIVE
Protein, ur: NEGATIVE mg/dL
Specific Gravity, Urine: 1.023 (ref 1.005–1.030)
UROBILINOGEN UA: 4 mg/dL — AB (ref 0.0–1.0)
pH: 5.5 (ref 5.0–8.0)

## 2013-09-30 LAB — URINE MICROSCOPIC-ADD ON

## 2013-09-30 LAB — PHOSPHORUS: PHOSPHORUS: 4 mg/dL (ref 2.3–4.6)

## 2013-09-30 LAB — LIPASE, BLOOD: LIPASE: 9 U/L — AB (ref 11–59)

## 2013-09-30 MED ORDER — ONDANSETRON HCL 4 MG/2ML IJ SOLN
4.0000 mg | Freq: Four times a day (QID) | INTRAMUSCULAR | Status: DC | PRN
  Administered 2013-09-30 – 2013-10-02 (×5): 4 mg via INTRAVENOUS
  Filled 2013-09-30 (×5): qty 2

## 2013-09-30 MED ORDER — FLUTICASONE PROPIONATE 50 MCG/ACT NA SUSP
1.0000 | Freq: Every day | NASAL | Status: DC
  Administered 2013-10-02 – 2013-10-06 (×4): 1 via NASAL
  Filled 2013-09-30: qty 16

## 2013-09-30 MED ORDER — PANTOPRAZOLE SODIUM 40 MG PO TBEC
40.0000 mg | DELAYED_RELEASE_TABLET | Freq: Every day | ORAL | Status: DC
Start: 2013-10-01 — End: 2013-10-06
  Administered 2013-10-01 – 2013-10-06 (×6): 40 mg via ORAL
  Filled 2013-09-30 (×6): qty 1

## 2013-09-30 MED ORDER — ONDANSETRON HCL 4 MG PO TABS
4.0000 mg | ORAL_TABLET | Freq: Four times a day (QID) | ORAL | Status: DC | PRN
Start: 2013-09-30 — End: 2013-10-06
  Administered 2013-10-05: 4 mg via ORAL
  Filled 2013-09-30: qty 1

## 2013-09-30 MED ORDER — DEXTROSE-NACL 5-0.45 % IV SOLN
INTRAVENOUS | Status: DC
  Administered 2013-09-30 – 2013-10-01 (×2): via INTRAVENOUS
  Administered 2013-10-02: 1000 mL via INTRAVENOUS
  Administered 2013-10-02 – 2013-10-03 (×2): via INTRAVENOUS

## 2013-09-30 MED ORDER — BOOST / RESOURCE BREEZE PO LIQD
1.0000 | Freq: Two times a day (BID) | ORAL | Status: DC
  Administered 2013-10-01 – 2013-10-03 (×5): 1 via ORAL

## 2013-09-30 MED ORDER — POLYETHYLENE GLYCOL 3350 17 G PO PACK
17.0000 g | PACK | Freq: Two times a day (BID) | ORAL | Status: DC
Start: 2013-10-01 — End: 2013-10-06
  Administered 2013-10-01 – 2013-10-06 (×7): 17 g via ORAL
  Filled 2013-09-30 (×12): qty 1

## 2013-09-30 MED ORDER — LOSARTAN POTASSIUM 50 MG PO TABS
50.0000 mg | ORAL_TABLET | Freq: Every day | ORAL | Status: DC
  Administered 2013-10-01 – 2013-10-02 (×2): 50 mg via ORAL
  Filled 2013-09-30 (×2): qty 1

## 2013-09-30 MED ORDER — METHYLPREDNISOLONE SODIUM SUCC 40 MG IJ SOLR
30.0000 mg | Freq: Every day | INTRAMUSCULAR | Status: DC
  Administered 2013-10-01 – 2013-10-02 (×2): 30 mg via INTRAVENOUS
  Filled 2013-09-30 (×4): qty 0.75

## 2013-09-30 MED ORDER — FINASTERIDE 5 MG PO TABS
5.0000 mg | ORAL_TABLET | Freq: Every day | ORAL | Status: DC
  Administered 2013-09-30 – 2013-10-05 (×6): 5 mg via ORAL
  Filled 2013-09-30 (×7): qty 1

## 2013-09-30 MED ORDER — BISACODYL 5 MG PO TBEC: 5.0000 mg | DELAYED_RELEASE_TABLET | Freq: Every day | ORAL | Status: DC | PRN

## 2013-09-30 NOTE — Consult Note (Signed)
[place] Reason for consult:Crohn's disease. Patient was seen today in my office. This is a summary of that visit. He will not be charged for another hospital visit for today. Referring Physician: PCP Dr. Ruthe Mannan is an 73 y.o. male.  HPI: patient was seen 5/8 after a long absence. He has a history of Crohn's disease and has had a history of a stricture in the ascending colon preventing passage completely to the cecum with colonoscope in the past. He has diverticular disease. He has not really been compliant with followup office visits. Last colonoscopy 2006 revealed stricture in the ascending colon and diverticulosis. He was having diverticular bleed at that time. Because of a strong family history of colon cancer a five-year followup was done 2011. He virtual colonoscopy was done revealing marked diverticular disease and probable scarring of the terminal ileum but no polyps or other gross lesions. The patient presented to the office with nausea and vomiting, rectal bleeding, abdominal pain and weight loss. At that time, he was being followed by Dr. Luisa Dago for leukopenia with WBC counts running 1.2-1.5. It was felt that the etiology of his leukopenia might have been on only. He had seen PCP and had CT scan of the abdomen 12/14 revealing diverticulosis without diverticulitis and circumferential thickening of the terminal ileum consistent with Crohn's disease with some inflammatory changes around the terminal ileum. The patient was having lots of nausea and vomiting and bloating consistent with partial obstruction. After discussion with Dr. 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 The plan was to see him back in 2 weeks and if his white count  was improved and his symptoms were better, attempted colonoscopy with ultrathin colonoscope. The patient returned try office with worsening symptoms of nausea and vomiting and a 40 pound weight loss since December documented. He has lost 14 pounds in the past 2-3 weeks despite being placed on steroids. His labs here revealed a total wbc 0.5 with absolute neutrophil count of 100. This is after 2 weeks of steroids. After discussion with Dr. Luisa Dago it was felt that he needed to come into the hospital for an urgent bone marrow biopsy and further workup of his significant leukopenia.he has received 2 or 3 doses of IV iron over the past several weeks and has hemoglobin  Has not really responded. This is despite no gross GI bleeding. He presented today to our office was bloating, nausea and vomiting, inability to keep down any solid foods, and a 14 pound weight loss since his last visit he also has been having low-grade fevers. He has had no bowel movement in a week.  Past Medical History  Diagnosis Date  . Crohn's disease   . Iron deficiency anemia   . Leukopenia   . Hypertension   . BPH (benign prostatic hyperplasia)   . 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 Kidney stones     Past Surgical History  Procedure Laterality Date  . Lumbar fusion      L5-S1  . Kidney stone surgery    . Cataract extraction Right   . Cardiac catheterization  2001    Family History  Problem Relation Age of Onset  . Colon cancer Mother   .  Emphysema Father   . CAD Father   . Stroke Father   . Crohn's disease Neg Hx   . Ulcerative colitis Neg Hx   . Leukemia Neg Hx   . Ovarian cancer Paternal Aunt     Social History:  reports that he has never smoked. He does not have any smokeless tobacco history on file. He reports that he does not drink alcohol or use illicit drugs.  Allergies:  Allergies  Allergen Reactions  . Maxzide [Triamterene-Hctz]   . Metoprolol     Medications; Prior  to Admission medications   Medication Sig Start Date End Date Taking? Authorizing Provider  finasteride (PROSCAR) 5 MG tablet Take 5 mg by mouth at bedtime.  01/27/13  Yes Historical Provider, MD  losartan (COZAAR) 50 MG tablet Take 50 mg by mouth daily. 04/08/13  Yes Historical Provider, MD  omeprazole (PRILOSEC) 20 MG capsule Take 20 mg by mouth daily before breakfast.  03/28/13  Yes Historical Provider, MD  predniSONE (DELTASONE) 20 MG tablet Take 20 mg by mouth 2 (two) times daily.    Yes Historical Provider, MD  verapamil (CALAN-SR) 240 MG CR tablet Take 240 mg by mouth daily.   Yes Historical Provider, MD   . feeding supplement (RESOURCE BREEZE)  1 Container Oral BID BM  . finasteride  5 mg Oral QHS  . [START ON 10/01/2013] fluticasone  1 spray Each Nare Daily  . [START ON 10/01/2013] losartan  50 mg Oral Daily  . [START ON 10/01/2013] methylPREDNISolone (SOLU-MEDROL) injection  30 mg Intravenous Q breakfast  . [START ON 10/01/2013] pantoprazole  40 mg Oral Daily  . [START ON 10/01/2013] polyethylene glycol  17 g Oral BID   PRN Meds bisacodyl, ondansetron (ZOFRAN) IV, ondansetron Results for orders placed during the hospital encounter of 09/30/13 (from the past 48 hour(s))  URINALYSIS, ROUTINE W REFLEX MICROSCOPIC     Status: Abnormal   Collection Time    09/30/13  3:15 PM      Result Value Ref Range   Color, Urine ORANGE (*) YELLOW   Comment: BIOCHEMICALS MAY BE AFFECTED BY COLOR   APPearance CLOUDY (*) CLEAR   Specific Gravity, Urine 1.023  1.005 - 1.030   pH 5.5  5.0 - 8.0   Glucose, UA NEGATIVE  NEGATIVE mg/dL   Hgb urine dipstick NEGATIVE  NEGATIVE   Bilirubin Urine MODERATE (*) NEGATIVE   Ketones, ur NEGATIVE  NEGATIVE mg/dL   Protein, ur NEGATIVE  NEGATIVE mg/dL   Urobilinogen, UA 4.0 (*) 0.0 - 1.0 mg/dL   Nitrite NEGATIVE  NEGATIVE   Leukocytes, UA SMALL (*) NEGATIVE  URINE MICROSCOPIC-ADD ON     Status: Abnormal   Collection Time    09/30/13  3:15 PM      Result Value  Ref Range   Squamous Epithelial / LPF RARE  RARE   WBC, UA 0-2  <3 WBC/hpf   Bacteria, UA MANY (*) RARE   Urine-Other MUCOUS PRESENT    LIPASE, BLOOD     Status: Abnormal   Collection Time    09/30/13  3:26 PM      Result Value Ref Range   Lipase 9 (*) 11 - 59 U/L  COMPREHENSIVE METABOLIC PANEL     Status: Abnormal   Collection Time    09/30/13  3:27 PM      Result Value Ref Range   Sodium 134 (*) 137 - 147 mEq/L   Potassium 4.3  3.7 - 5.3 mEq/L   Chloride  95 (*) 96 - 112 mEq/L   CO2 26  19 - 32 mEq/L   Glucose, Bld 114 (*) 70 - 99 mg/dL   BUN 19  6 - 23 mg/dL   Creatinine, Ser 0.98  0.50 - 1.35 mg/dL   Calcium 8.7  8.4 - 10.5 mg/dL   Total Protein 6.4  6.0 - 8.3 g/dL   Albumin 2.5 (*) 3.5 - 5.2 g/dL   AST 29  0 - 37 U/L   ALT 42  0 - 53 U/L   Alkaline Phosphatase 121 (*) 39 - 117 U/L   Total Bilirubin 1.4 (*) 0.3 - 1.2 mg/dL   GFR calc non Af Amer 79 (*) >90 mL/min   GFR calc Af Amer >90  >90 mL/min   Comment: (NOTE)     The eGFR has been calculated using the CKD EPI equation.     This calculation has not been validated in all clinical situations.     eGFR's persistently <90 mL/min signify possible Chronic Kidney     Disease.  MAGNESIUM     Status: None   Collection Time    09/30/13  3:27 PM      Result Value Ref Range   Magnesium 2.0  1.5 - 2.5 mg/dL  PHOSPHORUS     Status: None   Collection Time    09/30/13  3:27 PM      Result Value Ref Range   Phosphorus 4.0  2.3 - 4.6 mg/dL  CBC WITH DIFFERENTIAL     Status: Abnormal   Collection Time    09/30/13  3:27 PM      Result Value Ref Range   WBC 0.9 (*) 4.0 - 10.5 K/uL   Comment: WHITE COUNT CONFIRMED ON SMEAR     CRITICAL RESULT CALLED TO, READ BACK BY AND VERIFIED WITH:     DEUSTCH,J AT 1646 ON [id number] BY POTEAT,S   RBC 3.20 (*) 4.22 - 5.81 MIL/uL   Hemoglobin 8.9 (*) 13.0 - 17.0 g/dL   HCT 30.1 (*) 39.0 - 52.0 %   MCV 94.1  78.0 - 100.0 fL   MCH 27.8  26.0 - 34.0 pg   MCHC 29.6 (*) 30.0 - 36.0 g/dL   RDW  18.3 (*) 11.5 - 15.5 %   Platelets 306  150 - 400 K/uL   Neutrophils Relative % 39 (*) 43 - 77 %   Lymphocytes Relative 46  12 - 46 %   Monocytes Relative 15 (*) 3 - 12 %   Eosinophils Relative 0  0 - 5 %   Basophils Relative 0  0 - 1 %   Neutro Abs 0.4 (*) 1.7 - 7.7 K/uL   Lymphs Abs 0.4 (*) 0.7 - 4.0 K/uL   Monocytes Absolute 0.1  0.1 - 1.0 K/uL   Eosinophils Absolute 0.0  0.0 - 0.7 K/uL   Basophils Absolute 0.0  0.0 - 0.1 K/uL   RBC Morphology POLYCHROMASIA PRESENT     Comment: STOMATOCYTES     ELLIPTOCYTES   Smear Review LARGE PLATELETS PRESENT      No results found.             Blood pressure 131/64, pulse 81, temperature 98.4 F (36.9 C), temperature source Oral, resp. rate 18, height 5' 9.5" (1.765 m), weight 88.4 kg (194 lb 14.2 oz), SpO2 100.00%.  Physical exam:  This is a summary of the physical exam done in my office this afternoon. General--pale and appears ill  Abdomen--slightly distended with  active bowel sounds and generally nontender Rectal-stool very brown  Trace Hemoccult-positive   Assessment: 1. Crohn's disease. CT scan 12/14 suggested active Crohn's. He has had virtual colonoscopy 4 years ago that did not really show anything other than his marked diverticular disease. He could have partial small bowel obstruction causing his nausea and vomiting. 2. Nausea and vomiting, weight loss. He's not really eating very much and this could be due to partial small bowel obstruction from Crohn's. 3. Hemoccult-positive stools without gross bleeding. 4. Anemia possibly due to bleeding with contributing issues with bone marrow. 5. Severe leukopenia. This is been progressive and if anything has worsen despite recent steroid use. Hematology is on board I think this is his primary concern at the current time. 6. 40 pound weight loss. This been over the past several months and is well documented. I doubt that this is all from partial small bowel obstruction from  Crohn's. Likely is multifactorial.  Plan: 1.feel we need to explain his hematologic abnormalities first. He may have partial obstruction due to Crohn's but I doubt that is causing all of his symptoms. He appears to have significant bone marrow suppression resulting anemia and leukopenia currently unexplained. 2. We will follow with you. At some point he will need further evaluation of colon and small bowel be a colonoscopy or small bowel enterography. 3. Would keep him on steroids for now. Dr. Luisa Dago to follow with [place]. We'll likely go ahead and get bone marrow biopsy soon.   Winfield Cunas. 09/30/2013, 5:45 PM

## 2013-09-30 NOTE — H&P (Addendum)
[facility] History and Physical  [name] QIO:[id number] DOB: [birth year] DOA: 09/30/2013  Referring physician:  Dr. Oletta Lamas, GI PCP:  [name], MD  GI:  Dr. Oletta Lamas Hematology/Oncology:  Dr. Alen Blew  Chief Complaint:  Nausea, vomiting, abdominal distension  HPI:  The patient is a 73 y.o. year-old male with history of Crohn's disease, iron deficiency anemia, unexplained neutropenia, hypertension, acid reflux, BPH who was sent from gastroenterology because of a combination of nausea, vomiting, and abdominal distention and worsening leukopenia despite prednisone.  The patient presented to the GI office approximately 2-3 weeks ago with complaints of nausea, vomiting, and abdominal distention. He was started on prednisone for presumptive Crohn's flare.  He is also followed by Doctor Alen Blew for her chronic anemia and leukopenia. He has had cytology which was negative for lymphoproliferative disorder comment has been receiving iron infusions in the infusion clinic. It was felt that he had an autoimmune process related to his Crohn's disease which was contributing to his leukopenia. The possibility of bone marrow biopsy was discussed with the patient but was deferred because of his Crohn's flare. His oncologist had hoped that his white blood cell count would improve with the initiation of prednisone, however, one week ago, his white blood cell count was 1100, and today in clinic it was reported to be 500.  He is being admitted for bone marrow biopsy and further work up of his leukopenia.  Per Dr. Oletta Lamas, he does not need escalation of his crohn's treatment at this time.    The patient states that he was feeling well until approximately 3 months ago when he developed unexplained weight loss. He has lost 37 pounds in 3 months. Over the last month, he has had nausea with nonbilious, nonbloody emesis after each of his meals. This has been associated with abdominal bloating and cramping. The  worst cramping is located just to the right and superior of the umbilicus. It comes and goes. Eating brings on. It goes away on its own. Is no bowel movement in the last 8 days.  He feels thirsty and dehydrated. He has had some night sweats which occurred with his previous Crohn's flare. He denies any complaint fevers. He has been very weak this past weekend had to use a rolling walker for ambulation. Normally ambulates without difficulty and without assist device.  Review of Systems:  General:  Denies fevers, + chills, 37-lb weight loss over 3 months HEENT:  Denies changes to hearing and vision, rhinorrhea, sinus congestion, sore throat CV:  Denies chest pain and palpitations, lower extremity edema.  PULM:  Denies SOB, wheezing, cough.   GI:  Per HPI GU:  Denies dysuria, frequency, urgency ENDO:  Denies polyuria, polydipsia.   HEME:  Denies hematemesis, blood in stools, melena, abnormal bruising or bleeding.  LYMPH:  Denies lymphadenopathy.   MSK:  Denies arthralgias, myalgias.   DERM:  Denies skin rash or ulcer.   NEURO:  Denies focal numbness, weakness, slurred speech, confusion, facial droop.  Generalized weakness PSYCH:  Denies anxiety and depression.    Past Medical History  Diagnosis Date  . Crohn's disease   . Iron deficiency anemia   . Leukopenia   . Hypertension   . BPH (benign prostatic hyperplasia)   . 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 Kidney stones    Past Surgical History  Procedure Laterality Date  . Lumbar fusion      L5-S1  . Kidney stone surgery    .  Cataract extraction Right   . Cardiac catheterization  2001   Social History:  reports that he has never smoked. He does not have any smokeless tobacco history on file. He reports that he does not drink alcohol or use illicit drugs. Lives with his wife.  Has recently been using a rolling walker.  Wife has ovarian cancer and has been receiving chemotherapy.  Was in remission and restarted  chemo in February because of recurrence.   Allergies  Allergen Reactions  . Maxzide [Triamterene-Hctz]   . Metoprolol     Family History  Problem Relation Age of Onset  . Colon cancer Mother   . Emphysema Father   . CAD Father   . Stroke Father   . Crohn's disease Neg Hx   . Ulcerative colitis Neg Hx   . Leukemia Neg Hx   . Ovarian cancer Paternal Aunt      Prior to Admission medications   Medication Sig Start Date End Date Taking? Authorizing Provider  finasteride (PROSCAR) 5 MG tablet Take 5 mg by mouth daily. 01/27/13   Historical Provider, MD  losartan (COZAAR) 50 MG tablet Take 50 mg by mouth daily. 04/08/13   Historical Provider, MD  metroNIDAZOLE (FLAGYL) 250 MG tablet  09/12/13   Historical Provider, MD  omeprazole (PRILOSEC) 20 MG capsule Take 20 mg by mouth daily. 03/28/13   Historical Provider, MD  predniSONE (DELTASONE) 5 MG tablet  09/15/13   Historical Provider, MD  verapamil (CALAN-SR) 240 MG CR tablet Take 240 mg by mouth daily. 02/25/13   Historical Provider, MD   Physical Exam: Filed Vitals:   09/30/13 1352  BP: 131/64  Pulse: 81  Temp: 98.4 F (36.9 C)  TempSrc: Oral  Resp: 18  Height: 5' 9.5" (1.765 m)  Weight: 88.4 kg (194 lb 14.2 oz)  SpO2: 100%     General:  Caucasian male, no acute distress  Eyes:  PERRL, anicteric, non-injected.  ENT:  Nares clear.  OP clear, mildly erythematous tonsils with some cobblestoning, no ulcers or exudates.  MMM.  Neck:  Supple without TM or JVD.    Lymph:  No cervical, supraclavicular, or submandibular LAD.  Cardiovascular:  RRR, normal S1, S2, without m/r/g.  2+ pulses, warm extremities  Respiratory:  CTA bilaterally without increased WOB.  Abdomen:  Hypoactive and BS.  Soft, mildly distended, tenderness to palpation in the medial right upper quadrant just superior and lateral to the umbilicus, no rebound or guarding. Full feeling    Skin:  No rashes or focal lesions.  Musculoskeletal:  Normal bulk and  tone.  No LE edema.  Psychiatric:  A & O x 4.  Appropriate affect.  Neurologic:  CN 3-12 intact.  5/5 strength.  Sensation intact.  Labs on Admission:  Basic Metabolic Panel: No results found for this basename: NA, K, CL, CO2, GLUCOSE, BUN, CREATININE, CALCIUM, MG, PHOS,  in the last 168 hours Liver Function Tests: No results found for this basename: AST, ALT, ALKPHOS, BILITOT, PROT, ALBUMIN,  in the last 168 hours No results found for this basename: LIPASE, AMYLASE,  in the last 168 hours No results found for this basename: AMMONIA,  in the last 168 hours CBC: No results found for this basename: WBC, NEUTROABS, HGB, HCT, MCV, PLT,  in the last 168 hours Cardiac Enzymes: No results found for this basename: CKTOTAL, CKMB, CKMBINDEX, TROPONINI,  in the last 168 hours  BNP (last 3 results) No results found for this basename: PROBNP,  in the  last 8760 hours CBG: No results found for this basename: GLUCAP,  in the last 168 hours  Radiological Exams on Admission: No results found.  Assessment/Plan Active Problems:   Crohn's disease   Iron deficiency anemia   Leukopenia   Hypertension   BPH (benign prostatic hyperplasia)   Weakness   Protein-calorie malnutrition, severe  ---  Leukopenia, severe neutropenia -  Hematology Oncology consultation -  NPO for possible bone marrow biopsy today >> to be ordered by hematology -  Defer antibiotics for now, but monitor for fever -  No rectal exams, medications or procedures while neutropenic  Abdominal pain, likely crohn's disease with flare -  Continue prednisone 61m BID -  Dr. EOletta Lamasaware of admission -  Check LFTs, lipase, UA  Constipation vs. Bowel obstruction -  KUB -  Start bid miralax -  No rectal exams, suppositories, or enemas while neutropenic -  Antiemetics and IVF  HTN, stable, continue losartan  BPH, stable, continue finasteride  Allergic rhinitis, start nasal steroid  Iron deficiency anemia, receives iron  infusions in oncology clinic -  Defer to Dr. SAlen Blew Generalized weakness, likely related to Crohn's flare and whatever process is causing his leukopenia  -  Screen for infection with CXR, UA -  No antibiotics at this time -  PT evaluation  Severe protein calorie malnutrition  -  CLD -  Breeze -  Nutrition consult  Diet:  NPO Access:  PIV IVF:  yes Proph:  SCDs pending bone marrow  Code Status: Full code Family Communication: patient, wife, and daughter. Disposition Plan: Admit to med-surg  Time spent: 60 min [facility] Pager [phone] If 7PM-7AM, please contact night-coverage [email or web address] Password [email or web address] 09/30/2013, 2:52 PM

## 2013-09-30 NOTE — Progress Notes (Signed)
CRITICAL VALUE ALERT  Critical value received:  WBC 0.9  Date of notification:  09/30/2013  Time of notification:  1645  Critical value read back: yes  Nurse who received alert:  Kaylyn Layer  MD notified (1st page): Dr. Sheran Fava  Time of first page:  1707  MD notified (2nd page):  Time of second page:  Responding MD:  Dr. Sheran Fava  Time MD responded:  1708  No new orders at this time. Pt admitted with an already known low WBC. Will continue to monitor.   Kaylyn Layer

## 2013-09-30 NOTE — Progress Notes (Signed)
Patient ID: [name], male   DOB: 1940/09/28, 73 y.o.   MRN: [id number] Request received from oncology/TR[facility] for CT guided bone marrow biopsy on pt with history of anemia/neutropenia to r/o  MDS. Additional PMH as below. Exam: pt awake/alert; chest- CTA bilat; heart- RRR; abd- soft,+BS, mildly tender RUQ, mild dist.; ext- FROM, no edema.  Filed Vitals:   09/30/13 1352  BP: 131/64  Pulse: 81  Temp: 98.4 F (36.9 C)  TempSrc: Oral  Resp: 18  Height: 5' 9.5" (1.765 m)  Weight: 194 lb 14.2 oz (88.4 kg)  SpO2: 100%   Past Medical History  Diagnosis Date  . Crohn's disease   . Iron deficiency anemia   . Leukopenia   . Hypertension   . BPH (benign prostatic hyperplasia)   . 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 Kidney stones    Past Surgical History  Procedure Laterality Date  . Lumbar fusion      L5-S1  . Kidney stone surgery    . Cataract extraction Right   . Cardiac catheterization  2001   Results for orders placed in visit on 09/23/13  HOLD TUBE, BLOOD BANK      Result Value Ref Range   Hold Tube, Blood Bank Blood Bank Order Cancelled     A/P: Pt with hx of anemia/neutropenia,?MDS. Plan is for CT guided bone marrow biopsy on 5/27. Details/risks of procedure d/w pt/family with their understanding and consent.

## 2013-09-30 NOTE — Progress Notes (Signed)
Pt vomited about 250 ml of brown emesis about 20 minutes after attempting to eat a clear liquid diet. Pt had only consumed a bowl of broth, half a popsicle, and half a glass of juice. Pt given zofran IV and shortly after his vomiting and heaves stopped. Pt resting now, will continue to monitor.   Kaylyn Layer 09/30/2013 5:14 PM

## 2013-09-30 NOTE — Progress Notes (Signed)
IP PROGRESS NOTE  Subjective:   Principle Diagnosis: 73 year old gentleman with neutropenia and mild anemia with history of inflammatory bowel disease. He has also iron deficiency anemia. As was diagnosed in December of 2014. 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   Prior Therapy: He is S/P IV iron in the form of Feraheme for a total of 1 g given in March of 2015.   Current therapy: Under evaluation for his neutropenia and anemia.  Mr. Steedman was hospitalized electively today due to failure to thrive and weakness. And to expedite his pancytopenia evaluation, we will arrange for a bone marrow biopsy to done in patients. Patient had been on steroids without really any help his symptoms. He continued to have nausea, vomiting, abdominal pain and weight loss. He have had poor by mouth intake without any much improvement. He is status post IV iron treatments last week without any improvement in his symptoms either. Objective:  Vital signs in last 24 hours: Temp:  [98.4 F (36.9 C)] 98.4 F (36.9 C) (05/26 1352) Pulse Rate:  [81] 81 (05/26 1352) Resp:  [18] 18 (05/26 1352) BP: (131)/(64) 131/64 mmHg (05/26 1352) SpO2:  [100 %] 100 % (05/26 1352) Weight:  [194 lb 14.2 oz (88.4 kg)] 194 lb 14.2 oz (88.4 kg) (05/26 1352) Weight change:  Last BM Date: 09/22/13  Intake/Output from previous day:    Alert awake gentleman appeared in some mild distress. Did not appear toxic Mouth: mucous membranes moist, pharynx normal without lesions Resp: clear to auscultation bilaterally Cardio: regular rate and rhythm, S1, S2 normal, no murmur, click, rub or gallop GI: soft, non-tender; bowel sounds normal; no masses,  no organomegaly Extremities: extremities normal, atraumatic, no cyanosis or edema    Assessment/Plan:  73 year old gentleman with the following issues:  1. Pancytopenia: The etiology remains unclear. I have discussed with him the role of  bone marrow biopsy to evaluate this. The differential diagnosis includes myelodysplastic syndrome and possibly leukemia. He is agreeable to proceed and interventional radiology will perform this in the morning.  His CBC from today is pending I would recommend transfusing him with packed red cells if his hemoglobin below 8.  2. Failure to thrive: It is unclear whether this is all related to his Crohn's disease or maybe related to undiagnosed hematological disorder. I agree with hydration and supportive measures.  3. Disposition: If he is feeling well in the next 24-48 hours I have no objections to discharge him and I will followup on the results of his bone marrow biopsy as an outpatient.    LOS: 0 days   Wyatt Portela 09/30/2013, 3:33 PM

## 2013-09-30 NOTE — Progress Notes (Signed)
INITIAL NUTRITION ASSESSMENT  DOCUMENTATION CODES Per approved criteria  -Severe malnutrition in the context of chronic illness  Pt meets criteria for severe MALNUTRITION in the context of chronic illness as evidenced by PO intake <75% for > one month, 16% body weight loss in 3 months, severe subcutaneous fat loss and moderate muscle wasting   INTERVENTION: -Recommend Resource Breeze po TID, each supplement provides 250 kcal and 9 grams of protein -Modify to Ensure Complete po BID, each supplement provides 350 kcal and 13 grams of protein as tolerated -Add high protein/kcal snacks w/diet advancement -Will continue to monitor  NUTRITION DIAGNOSIS: Inadequate oral intake related to nausea/abd pain/early satiety as evidenced by PO intake <75%, 40 lbs unintentional wt loss.   Goal: Pt to meet >/= 90% of their estimated nutrition needs    Monitor:  Total protein/energy intake, labs, weights, GI profile  Reason for Assessment: Consult/MST  73 y.o. male  Admitting Dx: <principal problem not specified>  ASSESSMENT: The patient is a 73 y.o. year-old male with history of Crohn's disease, iron deficiency anemia, unexplained neutropenia, hypertension, acid reflux, BPH who was sent from gastroenterology because of a combination of nausea, vomiting, and abdominal distention and worsening leukopenia despite prednisone  -Pt endorsed an unintentional wt loss of 37 lbs, or 16% body weight in past 3 months d/t n/v and abd distention.  -Has been following largely liquid diet for past 2-3 weeks for PCP. Consumes some soft foods for breakfast-eggs/soft cereals, would consume broth and water for remainder of meals -Noted weight loss was started as intentional by exercise and portion control; however, prior to liquid diet, pt reported a general disinterest in foods and early satiety. Meals would consist of apple or pt would occasionally tolerate certain mets -Pt w/out BM for past 8 days, and has hx of  constipation. This is likely contributing to feelings of early satiety. MD noted possible bowel obstruction. -Pt on clear liquid diet- will add Resource Breeze then modify to Ensure Complete for additional kcal/protein -Family concerned about cost of supplementations- will provide coupons and include high kcal/protein snacks as diet advancement tolerated Nutrition Focused Physical Exam:  Subcutaneous Fat:  Orbital Region: WDL Upper Arm Region: severe wasting Thoracic and Lumbar Region: n/a  Muscle:  Temple Region: moderate Clavicle Bone Region: moderate wasting Clavicle and Acromion Bone Region: moderate wasting Scapular Bone Region: n/a Dorsal Hand: WDL Patellar Region: WDL Anterior Thigh Region: WDL Posterior Calf Region: WDL  Edema: none noted     Height: Ht Readings from Last 1 Encounters:  09/30/13 5' 9.5" (1.765 m)    Weight: Wt Readings from Last 1 Encounters:  09/30/13 194 lb 14.2 oz (88.4 kg)    Ideal Body Weight: 166 lbs  % Ideal Body Weight: 117%  Wt Readings from Last 10 Encounters:  09/30/13 194 lb 14.2 oz (88.4 kg)  09/23/13 209 lb 4.8 oz (94.938 kg)  07/22/13 229 lb 4.8 oz (104.01 kg)  04/25/13 234 lb 14.4 oz (106.55 kg)    Usual Body Weight: 230 lbs  % Usual Body Weight: 84%  BMI:  Body mass index is 28.38 kg/(m^2).  Estimated Nutritional Needs: Kcal: 1800-2000 Protein: 90-100 gram Fluid: >/=1800 ml/daily  Skin: WDL  Diet Order: Clear Liquid  EDUCATION NEEDS: -No education needs identified at this time   Intake/Output Summary (Last 24 hours) at 09/30/13 1526 Last data filed at 09/30/13 1515  Gross per 24 hour  Intake      0 ml  Output  75 ml  Net    -75 ml    Last BM: 5/18   Labs:  No results found for this basename: NA, K, CL, CO2, BUN, CREATININE, CALCIUM, MG, PHOS, GLUCOSE,  in the last 168 hours  CBG (last 3)  No results found for this basename: GLUCAP,  in the last 72 hours  Scheduled Meds: . feeding supplement  (RESOURCE BREEZE)  1 Container Oral BID BM  . finasteride  5 mg Oral QHS  . fluticasone  1 spray Each Nare Daily  . losartan  50 mg Oral Daily  . methylPREDNISolone (SOLU-MEDROL) injection  30 mg Intravenous Daily  . pantoprazole  40 mg Oral Daily  . [START ON 10/01/2013] polyethylene glycol  17 g Oral BID    Continuous Infusions: . dextrose 5 % and 0.45% NaCl      Past Medical History  Diagnosis Date  . Crohn's disease   . Iron deficiency anemia   . Leukopenia   . Hypertension   . BPH (benign prostatic hyperplasia)   . 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 Kidney stones     Past Surgical History  Procedure Laterality Date  . Lumbar fusion      L5-S1  . Kidney stone surgery    . Cataract extraction Right   . Cardiac catheterization  2001    Boon LDN Clinical Dietitian [phone]

## 2013-10-01 ENCOUNTER — Inpatient Hospital Stay (HOSPITAL_COMMUNITY): Payer: Medicare Other

## 2013-10-01 ENCOUNTER — Ambulatory Visit (HOSPITAL_COMMUNITY): Payer: Medicare Other

## 2013-10-01 ENCOUNTER — Encounter (HOSPITAL_COMMUNITY): Payer: Self-pay | Admitting: Radiology

## 2013-10-01 DIAGNOSIS — D709 Neutropenia, unspecified: Secondary | ICD-10-CM

## 2013-10-01 DIAGNOSIS — D509 Iron deficiency anemia, unspecified: Secondary | ICD-10-CM

## 2013-10-01 DIAGNOSIS — R509 Fever, unspecified: Secondary | ICD-10-CM

## 2013-10-01 DIAGNOSIS — N400 Enlarged prostate without lower urinary tract symptoms: Secondary | ICD-10-CM

## 2013-10-01 LAB — URINALYSIS, ROUTINE W REFLEX MICROSCOPIC
Glucose, UA: NEGATIVE mg/dL
HGB URINE DIPSTICK: NEGATIVE
Ketones, ur: NEGATIVE mg/dL
Nitrite: NEGATIVE
PH: 5.5 (ref 5.0–8.0)
Protein, ur: NEGATIVE mg/dL
Specific Gravity, Urine: 1.019 (ref 1.005–1.030)
Urobilinogen, UA: >8 mg/dL — ABNORMAL HIGH (ref 0.0–1.0)

## 2013-10-01 LAB — URINE MICROSCOPIC-ADD ON

## 2013-10-01 LAB — PROTIME-INR
INR: 1.38 (ref 0.00–1.49)
Prothrombin Time: 16.6 seconds — ABNORMAL HIGH (ref 11.6–15.2)

## 2013-10-01 LAB — LACTIC ACID, PLASMA: Lactic Acid, Venous: 1 mmol/L (ref 0.5–2.2)

## 2013-10-01 LAB — CBC
HEMATOCRIT: 27.5 % — AB (ref 39.0–52.0)
Hemoglobin: 8 g/dL — ABNORMAL LOW (ref 13.0–17.0)
MCH: 27.5 pg (ref 26.0–34.0)
MCHC: 29.1 g/dL — AB (ref 30.0–36.0)
MCV: 94.5 fL (ref 78.0–100.0)
Platelets: 270 10*3/uL (ref 150–400)
RBC: 2.91 MIL/uL — ABNORMAL LOW (ref 4.22–5.81)
RDW: 18.1 % — ABNORMAL HIGH (ref 11.5–15.5)
WBC: 1.1 10*3/uL — CL (ref 4.0–10.5)

## 2013-10-01 LAB — BONE MARROW EXAM: Bone Marrow Exam: 372

## 2013-10-01 LAB — APTT: aPTT: 28 s (ref 24–37)

## 2013-10-01 MED ORDER — SORBITOL 70 % SOLN
30.0000 mL | Freq: Once | Status: AC
  Administered 2013-10-01: 30 mL via ORAL
  Filled 2013-10-01: qty 30

## 2013-10-01 MED ORDER — MIDAZOLAM HCL 2 MG/2ML IJ SOLN
INTRAMUSCULAR | Status: AC | PRN
  Administered 2013-10-01: 2 mg via INTRAVENOUS

## 2013-10-01 MED ORDER — IOHEXOL 300 MG/ML  SOLN
100.0000 mL | Freq: Once | INTRAMUSCULAR | Status: AC | PRN
  Administered 2013-10-01: 100 mL via INTRAVENOUS

## 2013-10-01 MED ORDER — FENTANYL CITRATE 0.05 MG/ML IJ SOLN
INTRAMUSCULAR | Status: AC | PRN
  Administered 2013-10-01: 100 ug via INTRAVENOUS

## 2013-10-01 MED ORDER — CIPROFLOXACIN IN D5W 400 MG/200ML IV SOLN
400.0000 mg | Freq: Two times a day (BID) | INTRAVENOUS | Status: DC
  Administered 2013-10-01 – 2013-10-04 (×6): 400 mg via INTRAVENOUS
  Filled 2013-10-01 (×6): qty 200

## 2013-10-01 MED ORDER — METRONIDAZOLE IN NACL 5-0.79 MG/ML-% IV SOLN
500.0000 mg | Freq: Three times a day (TID) | INTRAVENOUS | Status: DC
  Administered 2013-10-01 – 2013-10-04 (×8): 500 mg via INTRAVENOUS
  Filled 2013-10-01 (×10): qty 100

## 2013-10-01 MED ORDER — OXYCODONE-ACETAMINOPHEN 5-325 MG PO TABS: 1.0000 | ORAL_TABLET | ORAL | Status: DC | PRN

## 2013-10-01 MED ORDER — HYDROMORPHONE HCL PF 1 MG/ML IJ SOLN: 1.0000 mg | INTRAMUSCULAR | Status: DC | PRN

## 2013-10-01 MED ORDER — SENNOSIDES-DOCUSATE SODIUM 8.6-50 MG PO TABS
1.0000 | ORAL_TABLET | Freq: Two times a day (BID) | ORAL | Status: DC
  Administered 2013-10-01 – 2013-10-05 (×7): 1 via ORAL
  Filled 2013-10-01 (×10): qty 1

## 2013-10-01 MED ORDER — FENTANYL CITRATE 0.05 MG/ML IJ SOLN
INTRAMUSCULAR | Status: AC
  Filled 2013-10-01: qty 2

## 2013-10-01 MED ORDER — MIDAZOLAM HCL 2 MG/2ML IJ SOLN
INTRAMUSCULAR | Status: AC
  Filled 2013-10-01: qty 2

## 2013-10-01 MED ORDER — SODIUM CHLORIDE 0.9 % IV BOLUS (SEPSIS)
500.0000 mL | Freq: Once | INTRAVENOUS | Status: AC
  Administered 2013-10-01: 500 mL via INTRAVENOUS

## 2013-10-01 MED ORDER — IOHEXOL 300 MG/ML  SOLN
25.0000 mL | INTRAMUSCULAR | Status: AC
  Administered 2013-10-01 (×2): 25 mL via ORAL

## 2013-10-01 NOTE — Procedures (Signed)
Successful RT ILIAC BM ASP AND BX NO COMP STABLE PATH PENDING FULL REPORT IN PACS

## 2013-10-01 NOTE — Progress Notes (Signed)
Patient ID: [name], male   DOB: 01-24-1941, 73 y.o.   MRN: [id number]  [facility] PROGRESS NOTE  [name] YME:[id number] DOB: 1940/10/10 DOA: 09/30/2013 PCP: [name], MD  Brief narrative: 73 y.o. year-old male with history of Crohn's disease, iron deficiency anemia, unexplained neutropenia, hypertension, acid reflux, BPH, who was sent from gastroenterology office for further evaluation of nausea, vomiting, constipation. In addition, it was noted that pt had worsening leukopenia on CBC which requires further evaluation. Pt reports 37 lbs weight loss over the past 3 months. Last BM 8 days prior to this admission.  Active Problems:   Abdominal pain with N/V - CT abd with several areas of inflammation, ? Diverticulitis - will place on Cipro and flagyl today  - ? Cholecystitis, pt did have fever overnight 101.6 F - will see with GI if further intervention required or supportive care sufficient for now    Crohn's disease - per GI, no further escalation in medical therapy for Crohn's  - continue Solumedrol 30 mg IV QD   Constipation - place on Miralax, Senna BID, sorbitol and monitor clinical response    Leukopenia with anemia of chronic disease - unclear etiology - bone marrow biopsy done this AM, follow up on results    Hypertension - on soft side with SBP in 100's   BPH (benign prostatic hyperplasia) - no signs of obstruction    Protein-calorie malnutrition, severe - secondary to acute illness, weight loss certainly worrisome for malignancy - will follow upon bone marrow biopsy - advance diet as pt able to tolerate   Consultants:  GI  Oncology   Procedures/Studies: Dg Chest 2 View  09/30/2013  Mild basilar atelectasis.  Otherwise negative exam.    Abd 1 View  09/30/2013  Non obstructed bowel gas pattern.    CT Biopsy  10/01/2013  CT guided right iliac bone marrow aspiration and core biopsy.    Antibiotics:  Cipro 5/27 -->  Flagyl 5/27 -->   Code  Status: Full Family Communication: Pt and wife at bedside Disposition Plan: Home when medically stable  HPI/Subjective: No events overnight.   Objective: Filed Vitals:   10/01/13 0854 10/01/13 0857 10/01/13 1051 10/01/13 1400  BP: 1_0 104/52  Pulse: 84 79 76 78  Temp:    98.2 F (36.8 C)  TempSrc:    Oral  Resp: _1 Height:      Weight:      SpO2: 99% 98% 99% 99%    Intake/Output Summary (Last 24 hours) at 10/01/13 1520 Last data filed at 10/01/13 1301  Gross per 24 hour  Intake 2037.5 ml  Output    925 ml  Net 1112.5 ml    Exam:   General:  Pt is alert, follows commands appropriately, not in acute distress  Cardiovascular: Regular rate and rhythm, S1/S2, no murmurs, no rubs, no gallops  Respiratory: Clear to auscultation bilaterally, no wheezing, no crackles, no rhonchi  Abdomen: Soft, tender in lower abd quadrants , slightly distended, bowel sounds present, no guarding  Extremities: No edema, pulses DP and PT palpable bilaterally  Neuro: Grossly nonfocal  Data Reviewed: Basic Metabolic Panel:  Recent Labs Lab 09/30/13 1527  NA 134*  K 4.3  CL 95*  CO2 26  GLUCOSE 114*  BUN 19  CREATININE 0.98  CALCIUM 8.7  MG 2.0  PHOS 4.0   Liver Function Tests:  Recent Labs Lab 09/30/13 1527  AST 29  ALT 42  ALKPHOS 121*  BILITOT 1.4*  PROT 6.4  ALBUMIN 2.5*    Recent Labs Lab 09/30/13 1526  LIPASE 9*   CBC:  Recent Labs Lab 09/30/13 1527 10/01/13 0355  WBC 0.9* 1.1*  NEUTROABS 0.4*  --   HGB 8.9* 8.0*  HCT 30.1* 27.5*  MCV 94.1 94.5  PLT 306 270   Scheduled Meds: . finasteride  5 mg Oral QHS  . fluticasone  1 spray Each Nare Daily  . losartan  50 mg Oral Daily  . SOLU-MEDROL injection  30 mg Intravenous Q breakfast  . pantoprazole  40 mg Oral Daily  . polyethylene glycol  17 g Oral BID  . senna-docusate  1 tablet Oral BID  . sorbitol  30 mL Oral Once   Continuous Infusions: . dextrose 5 % and 0.45% NaCl  75 mL/hr at 10/01/13 0600   Theodis Blaze, MD  [facility] Pager [phone]  If 7PM-7AM, please contact night-coverage [email or web address] Password [email or web address] 10/01/2013, 3:20 PM   LOS: 1 day

## 2013-10-01 NOTE — Progress Notes (Signed)
IP PROGRESS NOTE  Subjective:   Patient is about the same. No new complaints. He tolerated bone marrow biopsy without complications. He did have a low-grade fever but has subsided this morning. He does not appear septic or toxic at this time.  Objective:  Vital signs in last 24 hours: Temp:  [98.4 F (36.9 C)-101.6 F (38.7 C)] 100 F (37.8 C) (05/27 0740) Pulse Rate:  [76-91] 76 (05/27 1051) Resp:  [16-21] 20 (05/27 1051) BP: (94-131)/(41-64) 99/49 mmHg (05/27 1051) SpO2:  [98 %-100 %] 99 % (05/27 1051) Weight:  [194 lb 14.2 oz (88.4 kg)] 194 lb 14.2 oz (88.4 kg) (05/26 1352) Weight change:  Last BM Date: 09/22/13  Intake/Output from previous day: 05/26 0701 - 05/27 0700 In: 2037.5 [P.O.:570; I.V.:967.5; IV Piggyback:500] Out: 725 [Urine:475; Emesis/NG output:250]   Alert awake gentleman appeared in some mild distress.  Mouth: mucous membranes moist, pharynx normal without lesions Resp: clear to auscultation bilaterally Cardio: regular rate and rhythm, S1, S2 normal, no murmur, click, rub or gallop GI: soft, non-tender; bowel sounds normal; no masses,  no organomegaly Extremities: extremities normal, atraumatic, no cyanosis or edema Neurological exam is intact.   CBC    Component Value Date/Time   WBC 1.1* 10/01/2013 0355   WBC 0.7* 09/23/2013 0811   RBC 2.91* 10/01/2013 0355   RBC 2.69* 09/23/2013 0811   HGB 8.0* 10/01/2013 0355   HGB 7.4* 09/23/2013 0811   HCT 27.5* 10/01/2013 0355   HCT 26.1* 09/23/2013 0811   PLT 270 10/01/2013 0355   PLT 277 09/23/2013 0811   MCV 94.5 10/01/2013 0355   MCV 97.0 09/23/2013 0811   MCH 27.5 10/01/2013 0355   MCH 27.5 09/23/2013 0811   MCHC 29.1* 10/01/2013 0355   MCHC 28.4* 09/23/2013 0811   RDW 18.1* 10/01/2013 0355   RDW 18.9* 09/23/2013 0811   LYMPHSABS 0.4* 09/30/2013 1527   LYMPHSABS 0.5* 09/23/2013 0811   MONOABS 0.1 09/30/2013 1527   MONOABS 0.0* 09/23/2013 0811   EOSABS 0.0 09/30/2013 1527   EOSABS 0.0 09/23/2013 0811   BASOSABS 0.0  09/30/2013 1527   BASOSABS 0.0 09/23/2013 0811     Assessment/Plan:  73 year old gentleman with the following issues:  1. Neutropenia: The etiology remains unclear. Bone marrow biopsy was done today and we will not have the result for few days. The differential diagnosis includes myelodysplastic syndrome and possible leukemia. This could also be autoimmune in nature but the lack of prednisone response is rather troublesome. I hope that we are dealing with a reactive process rather than acute leukemia. He will not be a candidate for full induction chemotherapy given his age and comorbid conditions. I will not use any growth factor support for the time being told the results of the bone marrow have been revealed.  If he develops persistent fever of 101.5 or higher,  I would start him on empiric antibiotics at that time.  2. Failure to thrive: It is unclear whether this is all related to his Crohn's disease or maybe related to undiagnosed hematological disorder. I agree with hydration and supportive measures.  3. Disposition: He does not appear to be irradiated for discharge at this time.  I will be out of the office until next week and have asked Dr. Marin Olp to check on him if needed to. He will also be available for any questions or concerns.    LOS: 1 day   Wyatt Portela 10/01/2013, 12:52 PM

## 2013-10-01 NOTE — Progress Notes (Signed)
Pt temp this AM 101.6, BP 100/52. Baltazar Najjar NP was paged. Awaiting orders. Noreene Larsson RN  [phone] New orders received. This RN to continue to monitor. Noreene Larsson RN

## 2013-10-01 NOTE — Progress Notes (Signed)
[facility] PROGRESS NOTE Subjective Pt getting BM this AM. Wife reports he is vomiting CLs. Temp to 101.6 last PM  Objective: Vital signs in last 24 hours: Temp:  [98.4 F (36.9 C)-101.6 F (38.7 C)] 100 F (37.8 C) (05/27 0740) Pulse Rate:  [80-91] 91 (05/27 0539) Resp:  [16-18] 18 (05/27 0539) BP: (100-131)/(47-64) 100/52 mmHg (05/27 0539) SpO2:  [98 %-100 %] 98 % (05/27 0539) Weight:  [88.4 kg (194 lb 14.2 oz)] 88.4 kg (194 lb 14.2 oz) (05/26 1352) Last BM Date: 09/22/13  Intake/Output from previous day: 05/26 0701 - 05/27 0700 In: 2037.5 [P.O.:570; I.V.:967.5; IV Piggyback:500] Out: 725 [Urine:475; Emesis/NG output:250] Intake/Output this shift:      Lab Results:  Recent Labs  09/30/13 1527 10/01/13 0355  WBC 0.9* 1.1*  HGB 8.9* 8.0*  HCT 30.1* 27.5*  PLT 306 270   BMET  Recent Labs  09/30/13 1527  NA 134*  K 4.3  CL 95*  CO2 26  CREATININE 0.98   LFT  Recent Labs  09/30/13 1527  PROT 6.4  AST 29  ALT 42  ALKPHOS 121*  BILITOT 1.4*   PT/INR  Recent Labs  10/01/13 0355  LABPROT 16.6*  INR 1.38   PANCREAS  Recent Labs  09/30/13 1526  LIPASE 9*         Studies/Results: Dg Chest 2 View  09/30/2013   CLINICAL DATA:  Chills.  Hypertension.  EXAM: CHEST  2 VIEW  COMPARISON:  No prior.  FINDINGS: Mediastinum and hilar structures normal. Cardiac structures are unremarkable. Minimal atelectasis lung bases. Poor inspiration. No focal infiltrate. No pleural effusion or pneumothorax. No acute bony abnormality.  IMPRESSION: Mild basilar atelectasis.  Otherwise negative exam.   Electronically Signed   By: Marcello Moores  Register   On: 09/30/2013 19:35   Abd 1 View (kub)  09/30/2013   CLINICAL DATA:  73 year old male with abdominal pain. History of Crohn disease. Initial encounter.  EXAM: ABDOMEN - 1 VIEW  COMPARISON:  CT Abdomen and Pelvis 04/18/2013.  FINDINGS: Portable AP supine view at 1912 hrs. Non obstructed bowel gas pattern.  Suggestion of a partially visualized left abdominal ostomy. No gross pneumoperitoneum on this supine view. Chronic lower lumbar fusion hardware. No acute osseous abnormality identified.  IMPRESSION: Non obstructed bowel gas pattern.   Electronically Signed   By: Lars Pinks M.D.   On: 09/30/2013 19:38    Medications: I have reviewed the patient's current medications.  Assessment/Plan: 1. Leukopenia. Process appears to be affecting only WBCs. Plts are normal and anemia probably partially due to chronic GI bleeding. Stools have been persistently positive ? Due to Crohns. 2. N+V./Wt loss. No clear SBO on KUB, ? Due to UTI. 3. Fever/UTI. Culture pending. Will need treatment. Will delay any evaluation of GI tract for now due to leukopenia. Would continue Miralax for constipation Will follow.   Winfield Cunas. 10/01/2013, 8:20 AM

## 2013-10-01 NOTE — Progress Notes (Signed)
Pt temp of 100.3. On call notified, Baltazar Najjar NP. No orders received at this time. Noreene Larsson RN

## 2013-10-01 NOTE — Evaluation (Signed)
Physical Therapy Evaluation Patient Details Name: [name] MRN: [id number] DOB: 09-22-1940 Today's Date: 10/01/2013   History of Present Illness  Admitted 09/30/13 withNausea, vomiting, abdominal distension, iron deficiiency anemia, and h/o crohn's dz.  Clinical Impression  Pt had biopsy 2 hours and was still a bit weak as well as weak on admit. Pt will benefit from PT while in acute care to address problems listed in note below. Pt should progress.     Follow Up Recommendations No PT follow up    Equipment Recommendations  None recommended by PT    Recommendations for Other Services       Precautions / Restrictions Precautions Precautions: Fall Precaution Comments: low HGB      Mobility  Bed Mobility Overal bed mobility: Needs Assistance Bed Mobility: Supine to Sit     Supine to sit: Supervision     General bed mobility comments: extra time due to dizziness.  Transfers Overall transfer level: Needs assistance Equipment used: Rolling walker (2 wheeled) Transfers: Sit to/from Stand Sit to Stand: Min guard         General transfer comment: cues for safety, not to jump up  Ambulation/Gait Ambulation/Gait assistance: Min assist Ambulation Distance (Feet): 5 Feet Assistive device: Rolling walker (2 wheeled) Gait Pattern/deviations: Step-through pattern     General Gait Details: pt limited due to dizziness, had been  for bone marrow biopsy and sedated 2 hours ago.  Stairs            Wheelchair Mobility    Modified Rankin (Stroke Patients Only)       Balance Overall balance assessment: Needs assistance Sitting-balance support: No upper extremity supported;Feet supported Sitting balance-Leahy Scale: Good     Standing balance support: Bilateral upper extremity supported;During functional activity Standing balance-Leahy Scale: Fair                               Pertinent Vitals/Pain No pain    Home Living Family/patient  expects to be discharged to:: Private residence Living Arrangements: Spouse/significant other Available Help at Discharge: Family Type of Home: House Home Access: Stairs to enter   Technical brewer of Steps: 2 Home Layout: One level [place]: Environmental consultant - 2 wheels      Prior Function Level of Independence: Independent               Hand Dominance        Extremity/Trunk Assessment   Upper Extremity Assessment: Generalized weakness           Lower Extremity Assessment: Generalized weakness      Cervical / Trunk Assessment: Normal  Communication   Communication: No difficulties  Cognition Arousal/Alertness: Awake/alert Behavior During Therapy: WFL for tasks assessed/performed Overall Cognitive Status: Within Functional Limits for tasks assessed                      General Comments      Exercises        Assessment/Plan    PT Assessment Patient needs continued PT services  PT Diagnosis Difficulty walking;Generalized weakness   PT Problem List Decreased activity tolerance;Decreased mobility;Decreased knowledge of precautions;Decreased safety awareness;Decreased knowledge of use of DME  PT Treatment Interventions DME instruction;Gait training;Functional mobility training;Therapeutic activities;Patient/family education   PT Goals (Current goals can be found in the Care Plan section) Acute Rehab PT Goals Patient Stated Goal: I want to feel stronger PT Goal Formulation: With patient/family  Time For Goal Achievement: 10/15/13 Potential to Achieve Goals: Good    Frequency Min 3X/week   Barriers to discharge        Co-evaluation               End of Session   Activity Tolerance: Patient limited by fatigue Patient left: in chair;with call bell/phone within reach;with family/visitor present Nurse Communication: Mobility status         Time: 1140-1153 PT Time Calculation (min): 13 min   Charges:   PT Evaluation $Initial  PT Evaluation Tier I: 1 Procedure PT Treatments $Therapeutic Activity: 8-22 mins   PT G Codes:          Claretha Cooper 10/01/2013, 12:02 PM Tresa Endo PT [phone]

## 2013-10-01 NOTE — Progress Notes (Signed)
Oncology Addendum:  The results of the bone marrow biopsy are still pending but I got a preliminary report by the reviewing pathologists Dr. Tresa Moore. 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 I discussed these findings with the patient and his wife.   I've also explained the treatment options. Ideally, he will require an aggressive and intensive course of intravenous chemotherapy that requires hospitalizations between 3-4 weeks. As I mentioned before, I think he is a poor candidate for full course of induction chemotherapy. He also understands this will necessitate transfer to a tertiary care center specialize in the treatment of leukemia such as [facility]. Alternatives  include lower intensity regimens that have very limited success especially in his age group.  I feel given the overall deterioration in his health as well as his comorbid conditions, his prognosis is very poor. AML and the elderly in general carries a dismal prognosis and his case is no exception.   My recommendation at this point is to consider conservative measures and supportive care only. I think you'll benefit from palliative medicine consult to define the goals of care as well as hospice discussion upon discharge.  All questions were answered today to their satisfaction.

## 2013-10-02 LAB — CBC WITH DIFFERENTIAL/PLATELET
BASOS ABS: 0 10*3/uL (ref 0.0–0.1)
Basophils Relative: 0 % (ref 0–1)
EOS ABS: 0 10*3/uL (ref 0.0–0.7)
Eosinophils Relative: 0 % (ref 0–5)
HCT: 25 % — ABNORMAL LOW (ref 39.0–52.0)
Hemoglobin: 7.2 g/dL — ABNORMAL LOW (ref 13.0–17.0)
LYMPHS PCT: 45 % (ref 12–46)
Lymphs Abs: 0.6 10*3/uL — ABNORMAL LOW (ref 0.7–4.0)
MCH: 27.3 pg (ref 26.0–34.0)
MCHC: 28.8 g/dL — AB (ref 30.0–36.0)
MCV: 94.7 fL (ref 78.0–100.0)
Monocytes Absolute: 0.3 10*3/uL (ref 0.1–1.0)
Monocytes Relative: 22 % — ABNORMAL HIGH (ref 3–12)
Neutro Abs: 0.4 10*3/uL — ABNORMAL LOW (ref 1.7–7.7)
Neutrophils Relative %: 33 % — ABNORMAL LOW (ref 43–77)
PLATELETS: 192 10*3/uL (ref 150–400)
RBC: 2.64 MIL/uL — ABNORMAL LOW (ref 4.22–5.81)
RDW: 18.1 % — AB (ref 11.5–15.5)
WBC: 1.3 10*3/uL — AB (ref 4.0–10.5)

## 2013-10-02 LAB — BASIC METABOLIC PANEL
BUN: 17 mg/dL (ref 6–23)
CALCIUM: 8.3 mg/dL — AB (ref 8.4–10.5)
CO2: 27 mEq/L (ref 19–32)
Chloride: 97 mEq/L (ref 96–112)
Creatinine, Ser: 1 mg/dL (ref 0.50–1.35)
GFR, EST AFRICAN AMERICAN: 83 mL/min — AB (ref >90)
GFR, EST NON AFRICAN AMERICAN: 72 mL/min — AB (ref >90)
Glucose, Bld: 125 mg/dL — ABNORMAL HIGH (ref 70–99)
POTASSIUM: 4.5 meq/L (ref 3.7–5.3)
Sodium: 135 mEq/L — ABNORMAL LOW (ref 137–147)

## 2013-10-02 MED ORDER — PREDNISONE 20 MG PO TABS
20.0000 mg | ORAL_TABLET | Freq: Every day | ORAL | Status: DC
  Administered 2013-10-03 – 2013-10-06 (×4): 20 mg via ORAL
  Filled 2013-10-02 (×5): qty 1

## 2013-10-02 NOTE — Progress Notes (Signed)
Patient ID: [name], male   DOB: [birth year], 73 y.o.   MRN: [id number]  [facility] PROGRESS NOTE  CLEMENTE DEWEY WUX:[id number] DOB: 05/06/1941 DOA: 09/30/2013 PCP: [name], MD  Brief narrative:  73 y.o. year-old male with history of Crohn's disease, iron deficiency anemia, unexplained neutropenia, hypertension, acid reflux, BPH, who was sent from gastroenterology office for further evaluation of nausea, vomiting, constipation. In addition, it was noted that pt had worsening leukopenia on CBC which requires further evaluation. Pt reports 37 lbs weight loss over the past 3 months. Last BM 8 days prior to this admission.   Active Problems:  Abdominal pain with N/V  - CT abd with several areas of inflammation, ? 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 - palliative care consult pending  - repeat CBC in AM and may need to have transfusion if Hg < 7 Crohn's disease  - per GI, no further escalation in medical therapy for Crohn's  - transition to Prednisone  Constipation  - place on Miralax, Senna BID, sorbitol  - had two BM yesterday and once today Hypertension  - on soft side with SBP in 100's  - clinically stable  BPH (benign prostatic hyperplasia)  - no signs of obstruction  Protein-calorie malnutrition, severe - secondary to acute illness, AML - advance diet as pt able to tolerate   Consultants:  GI  Oncology  PCT Procedures/Studies:  Dg Chest 2 View 09/30/2013 Mild basilar atelectasis. Otherwise negative exam.  Abd 1 View 09/30/2013 Non obstructed bowel gas pattern.  CT Biopsy 10/01/2013 CT guided right iliac bone marrow aspiration and core biopsy.  Antibiotics:  Cipro 5/27 -->  Flagyl 5/27 -->   Code Status: Full  Family Communication:  Pt and wife at bedside  Disposition Plan: Home when medically stable  HPI/Subjective: No events overnight.   Objective: Filed Vitals:   10/01/13 1051 10/01/13 1400 10/01/13 2213 10/02/13 0525  BP: 99/49 104/52 104/50 119/65  Pulse: 76 78 69 61  Temp:  98.2 F (36.8 C) 98.7 F (37.1 C) 98.6 F (37 C)  TempSrc:  Oral Oral Oral  Resp: 20 18 20 18   Height:      Weight:      SpO2: 99% 99% 99% 98%    Intake/Output Summary (Last 24 hours) at 10/02/13 1542 Last data filed at 10/02/13 0900  Gross per 24 hour  Intake   1300 ml  Output    450 ml  Net    850 ml    Exam:   General:  Pt is alert, follows commands appropriately, not in acute distress  Cardiovascular: Regular rate and rhythm, S1/S2, no murmurs, no rubs, no gallops  Respiratory: Clear to auscultation bilaterally, no wheezing, no crackles, no rhonchi  Abdomen: Soft, non tender, non distended, bowel sounds present, no guarding  Data Reviewed: Basic Metabolic Panel:  Recent Labs Lab 09/30/13 1527 10/02/13 0340  NA 134* 135*  K 4.3 4.5  CL 95* 97  CO2 26 27  GLUCOSE 114* 125*  BUN 19 17  CREATININE 0.98 1.00  CALCIUM 8.7 8.3*  MG 2.0  --   PHOS 4.0  --    Liver Function Tests:  Recent Labs Lab 09/30/13 1527  AST 29  ALT 42  ALKPHOS  121*  BILITOT 1.4*  PROT 6.4  ALBUMIN 2.5*    Recent Labs Lab 09/30/13 1526  LIPASE 9*   CBC:  Recent Labs Lab 09/30/13 1527 10/01/13 0355 10/02/13 0340  WBC 0.9* 1.1* 1.3*  NEUTROABS 0.4*  --  0.4*  HGB 8.9* 8.0* 7.2*  HCT 30.1* 27.5* 25.0*  MCV 94.1 94.5 94.7  PLT 306 270 192   Recent Results (from the past 240 hour(s))  URINE CULTURE     Status: None   Collection Time    09/30/13  3:15 PM      Result Value Ref Range Status   Specimen Description URINE, CLEAN CATCH   Final   Special Requests NONE   Final   Culture  Setup Time     Final   Value: 09/30/2013 22:29     Performed at [facility] PENDING   Incomplete    Culture     Final   Value: Culture reincubated for better growth     Performed at [facility]   Report Status PENDING   Incomplete  CULTURE, BLOOD (ROUTINE X 2)     Status: None   Collection Time    10/01/13  6:35 AM      Result Value Ref Range Status   Specimen Description BLOOD LEFT HAND   Final   Special Requests BOTTLES DRAWN AEROBIC AND ANAEROBIC 10CC   Final   Culture  Setup Time     Final   Value: 10/01/2013 12:13     Performed at [facility]   Culture     Final   Value:        BLOOD CULTURE RECEIVED NO GROWTH TO DATE CULTURE WILL BE HELD FOR 5 DAYS BEFORE ISSUING A FINAL NEGATIVE REPORT     Performed at [facility]   Report Status PENDING   Incomplete  CULTURE, BLOOD (ROUTINE X 2)     Status: None   Collection Time    10/01/13  6:45 AM      Result Value Ref Range Status   Specimen Description BLOOD RIGHT ARM   Final   Special Requests BOTTLES DRAWN AEROBIC AND ANAEROBIC 10CC   Final   Culture  Setup Time     Final   Value: 10/01/2013 12:13     Performed at [facility]   Culture     Final   Value:        BLOOD CULTURE RECEIVED NO GROWTH TO DATE CULTURE WILL BE HELD FOR 5 DAYS BEFORE ISSUING A FINAL NEGATIVE REPORT     Performed at [facility]   Report Status PENDING   Incomplete     Scheduled Meds: . ciprofloxacin  400 mg Intravenous Q12H  . feeding supplement (RESOURCE BREEZE)  1 Container Oral BID BM  . finasteride  5 mg Oral QHS  . fluticasone  1 spray Each Nare Daily  . losartan  50 mg Oral Daily  . methylPREDNISolone (SOLU-MEDROL) injection  30 mg Intravenous Q breakfast  . metronidazole  500 mg Intravenous Q8H  . pantoprazole  40 mg Oral Daily  . polyethylene glycol  17 g Oral BID  . senna-docusate  1 tablet Oral BID   Continuous Infusions: . dextrose 5 % and 0.45% NaCl 1,000 mL (10/02/13 1512)   Theodis Blaze, MD  [facility] Pager [phone]  If 7PM-7AM, please contact night-coverage [email or web address] Password  [facility] 10/02/2013, 3:42 PM   LOS: 2 days

## 2013-10-02 NOTE — Progress Notes (Signed)
[facility] PROGRESS NOTE Subjective results of bone marrow biopsy noted. Different treatment options for AML have been discussed with Dr Alen Blew.. Patient will be seen a hospice and palliative care. Supportive care recommended. He is doing better with antibiotic treatment of his UTI.  Objective: Vital signs in last 24 hours: Temp:  [98.2 F (36.8 C)-98.7 F (37.1 C)] 98.6 F (37 C) (05/28 0525) Pulse Rate:  [61-84] 61 (05/28 0525) Resp:  [18-21] 18 (05/28 0525) BP: (94-119)/(41-65) 119/65 mmHg (05/28 0525) SpO2:  [98 %-100 %] 98 % (05/28 0525) Last BM Date: 10/01/13  Intake/Output from previous day: 05/27 0701 - 05/28 0700 In: 1300 [P.O.:100; I.V.:900; IV Piggyback:300] Out: 575 [Urine:575] Intake/Output this shift:     Lab Results:  Recent Labs  09/30/13 1527 10/01/13 0355 10/02/13 0340  WBC 0.9* 1.1* 1.3*  HGB 8.9* 8.0* 7.2*  HCT 30.1* 27.5* 25.0*  PLT 306 270 192   BMET  Recent Labs  09/30/13 1527 10/02/13 0340  NA 134* 135*  K 4.3 4.5  CL 95* 97  CO2 26 27  CREATININE 0.98 1.00   LFT  Recent Labs  09/30/13 1527  PROT 6.4  AST 29  ALT 42  ALKPHOS 121*  BILITOT 1.4*   PT/INR  Recent Labs  10/01/13 0355  LABPROT 16.6*  INR 1.38   PANCREAS  Recent Labs  09/30/13 1526  LIPASE 9*         Studies/Results: Dg Chest 2 View  09/30/2013   CLINICAL DATA:  Chills.  Hypertension.  EXAM: CHEST  2 VIEW  COMPARISON:  No prior.  FINDINGS: Mediastinum and hilar structures normal. Cardiac structures are unremarkable. Minimal atelectasis lung bases. Poor inspiration. No focal infiltrate. No pleural effusion or pneumothorax. No acute bony abnormality.  IMPRESSION: Mild basilar atelectasis.  Otherwise negative exam.   Electronically Signed   By: Marcello Moores  Register   On: 09/30/2013 19:35   Abd 1 View (kub)  09/30/2013   CLINICAL DATA:  73 year old male with abdominal pain. History of Crohn disease. Initial encounter.  EXAM: ABDOMEN - 1 VIEW   COMPARISON:  CT Abdomen and Pelvis 04/18/2013.  FINDINGS: Portable AP supine view at 1912 hrs. Non obstructed bowel gas pattern. Suggestion of a partially visualized left abdominal ostomy. No gross pneumoperitoneum on this supine view. Chronic lower lumbar fusion hardware. No acute osseous abnormality identified.  IMPRESSION: Non obstructed bowel gas pattern.   Electronically Signed   By: Lars Pinks M.D.   On: 09/30/2013 19:38   Ct Abdomen Pelvis W Contrast  10/01/2013   CLINICAL DATA:  Mid abdominal pain.  History of leukemia.  EXAM: CT ABDOMEN AND PELVIS WITH CONTRAST  TECHNIQUE: Multidetector CT imaging of the abdomen and pelvis was performed using the standard protocol following bolus administration of intravenous contrast.  CONTRAST:  147m OMNIPAQUE IOHEXOL 300 MG/ML  SOLN  COMPARISON:  04/18/2013  FINDINGS: Gallbladder is distended. There is a thin rim of fluid surrounding the gallbladder and increased enhancement of the wall. No radiopaque stone is seen. There is no bile duct dilation.  Small low-density lesions are noted in the posterior aspect of the left lobe and posterior aspect of the right lobe near the dome, which were not evident previously. These are nonspecific. Liver is otherwise unremarkable.  Spleen is mildly enlarged measuring 14.5 cm. No splenic mass or focal lesion. Pancreas is unremarkable. No bile duct dilation. No adrenal masses.  6.9 cm cyst from the posterior mid to lower pole of the left kidney. Mild  diffuse bilateral renal cortical thinning. Kidneys otherwise unremarkable.  Normal ureters.  The normal bladder.  No adenopathy.  No ascites.  There are bowel abnormalities. Along the left colon at the junction of the sigmoid and descending colon, there is a 2.6 cm diverticulum with mild adjacent inflammatory change. No extraluminal air or abscess. There is also mild inflammatory change adjacent to diverticula along the mid sigmoid colon in the anterior upper pelvis. Other colonic  diverticula are noted without inflammatory change.  The distal ileum is thick wall or approximately 20 cm in length with mild associated mesenteric inflammatory change in several small prominent mesenteric lymph nodes.  No other bowel abnormalities.  Lung branches show coarse reticular areas of subsegmental atelectasis and scarring.  There has been a posterior L5-S1 fusion. Orthopedic hardware is well-seated. There are no osteoblastic or osteolytic lesions.  IMPRESSION:  1. Two areas of mild diverticulitis are noted, 1 at the junction of the descending and sigmoid colon and the other along the mid sigmoid colon. No extraluminal air or abscess. 2. Wall thickening of the distal ileum consistent with an infectious or inflammatory ileitis. 3. Fluid attenuation surrounds the gallbladder. Although no stone is seen, acute cholecystitis should be considered in the proper clinical setting. The fluid in the gallbladder may be reactive to the adjacent small bowel pathology. 4. No other evidence of an acute abnormality. Multiple chronic findings as detailed.   Electronically Signed   By: Lajean Manes M.D.   On: 10/01/2013 15:58   Ct Biopsy  10/01/2013   CLINICAL DATA:  Anemia, neutropenia  EXAM: CT GUIDED RIGHT ILIAC BONE MARROW ASPIRATION AND CORE BIOPSY  Date:  5/27/20155/27/2015 9:17 AM  Radiologist:  Jerilynn Mages. Daryll Brod, MD  Guidance:  CT  FLUOROSCOPY TIME:  None.  MEDICATIONS AND MEDICAL HISTORY: 2 mg Versed, 100 mcg fentanyl  ANESTHESIA/SEDATION: 15 min  CONTRAST:  None.  COMPLICATIONS: None  PROCEDURE: Informed consent was obtained from the patient following explanation of the procedure, risks, benefits and alternatives. The patient understands, agrees and consents for the procedure. All questions were addressed. A time out was performed.  The patient was positioned prone and noncontrast localization CT was performed of the pelvis to demonstrate the iliac marrow spaces.  Maximal barrier sterile technique utilized  including caps, mask, sterile gowns, sterile gloves, large sterile drape, hand hygiene, and betadine prep.  Under sterile conditions and local anesthesia, an 11 gauge coaxial bone biopsy needle was advanced into the Right iliac marrow space. Needle position was confirmed with CT imaging. Initially, bone marrow aspiration was performed. Next, the 11 gauge outer cannula was utilized to obtain a right iliac bone marrow core biopsy. Needle was removed. Hemostasis was obtained with compression. The patient tolerated the procedure well. Samples were prepared with the cytotechnologist. No immediate complications.  IMPRESSION: CT guided right iliac bone marrow aspiration and core biopsy.   Electronically Signed   By: Daryll Brod M.D.   On: 10/01/2013 09:55    Medications: I have reviewed the patient's current medications.  Assessment/Plan: 1. Crohn's disease. He finally has had a bowel movement with Miralax. The CT did show some possible active Crohn's disease that may be that he has responded to steroids. It appears that he will be under palliative care. I would try to taper to steroids down. At this point, do not feel that would be harmful to him to be on low-dose steroids in definitely. 2. AML. Due to his age and multiple other problems palliative care has  been chosen.    [name]. 10/02/2013, 8:38 AM

## 2013-10-02 NOTE — Progress Notes (Signed)
Pt was sitting up in chair when I arrived; wife, sons, grandsons, brother were bedside. Pt told me about the news he rec'd on his 80yr wedding anniv. Pt's wife shared she is also being treating for cancer. They talked about health changes; loss including an aunt will be buried today. Pt's wife was appropriately tearful during the visit but in control. Pt spoke of his supportive faith community and said he is covered by the blood. Pt and family were delightful and very appreciative of prayer and visit. Ernest Haber Chaplain  10/02/13 1100  Clinical Encounter Type  Visited With Patient and family together

## 2013-10-02 NOTE — Progress Notes (Addendum)
° ° °   10/02/13 1200  Clinical Encounter Type  Visited With Patient  Visit Type Follow-up

## 2013-10-02 NOTE — Progress Notes (Signed)
Events noted. CT scan results and labs reviewed.  I would suggest treating his colitis per GI and with supportive PRBC transfusion as needed.  I do not recommend any specific treatment for his AML. As I discussed with him before, his prognosis is poor regardless of the treatment approach.  I recommend supportive care only and possible hospice enrolment upon discharge. I have discussed this with the patient and his wife.

## 2013-10-03 DIAGNOSIS — C9200 Acute myeloblastic leukemia, not having achieved remission: Secondary | ICD-10-CM

## 2013-10-03 LAB — BASIC METABOLIC PANEL
BUN: 20 mg/dL (ref 6–23)
CALCIUM: 8 mg/dL — AB (ref 8.4–10.5)
CO2: 27 mEq/L (ref 19–32)
Chloride: 99 mEq/L (ref 96–112)
Creatinine, Ser: 1.06 mg/dL (ref 0.50–1.35)
GFR calc Af Amer: 78 mL/min — ABNORMAL LOW (ref >90)
GFR, EST NON AFRICAN AMERICAN: 67 mL/min — AB (ref >90)
GLUCOSE: 110 mg/dL — AB (ref 70–99)
POTASSIUM: 4.4 meq/L (ref 3.7–5.3)
SODIUM: 136 meq/L — AB (ref 137–147)

## 2013-10-03 LAB — PREPARE RBC (CROSSMATCH)

## 2013-10-03 LAB — CBC
HCT: 23.7 % — ABNORMAL LOW (ref 39.0–52.0)
HEMOGLOBIN: 6.8 g/dL — AB (ref 13.0–17.0)
MCH: 26.9 pg (ref 26.0–34.0)
MCHC: 28.7 g/dL — ABNORMAL LOW (ref 30.0–36.0)
MCV: 93.7 fL (ref 78.0–100.0)
Platelets: 193 10*3/uL (ref 150–400)
RBC: 2.53 MIL/uL — ABNORMAL LOW (ref 4.22–5.81)
RDW: 18 % — ABNORMAL HIGH (ref 11.5–15.5)
WBC: 1 10*3/uL — CL (ref 4.0–10.5)

## 2013-10-03 LAB — ABO/RH: ABO/RH(D): O POS

## 2013-10-03 NOTE — Progress Notes (Signed)
I saw Mr. Nuzzo this AM.  Dr. Alen Blew is out of town until next week.  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 Dr. Alen Blew talked to he and his wife. They understand the situation very well. They haven't decided to go for comfort care. Hospice will be involved upon discharge.  He was pretty hungry this morning. Hopefully, his every breakfast.  He did have laboratory work showed his hemoglobin to be 6.8. His white cell count is 1. Shockingly cause platelet count is still pretty normal.  He will get 2 units of blood. He's had no fever. He's had no bleeding. He's had no pain. There's been no nausea or vomiting.  His vital signs look stable. He is afebrile. Blood pressure 145/78. His lungs are clear. Oral exam shows no petechia or ecchymoses. There's no hematomas. Cardiac exam regular in rhythm. Abdomen is soft. There may be some slight splenic enlargement with his spleen being palpable at the left costal margin. Extremities shows some trace edema. Skin exam does show some scattered ecchymoses.  To complicate matters, his wife is undergoing treatment for ovarian cancer. She's he's been doing fairly well. He wants to try to be able to be strong enough to help her. Again, the hospice will definitively be a great resource.  We will continue to follow along. I am very thankful for the great care is getting from the in hospitalist and also from the staff on 3 E.!!!  Pete E.  Psalm 27:1

## 2013-10-03 NOTE — Progress Notes (Signed)
NUTRITION FOLLOW UP  Intervention:    Discontinue Resource Breeze po TID  Will continue to monitor PO intake   Nutrition Dx:   Inadequate oral intake related to nausea/abd pain/early satiety as evidenced by PO intake <75%, 40 lbs unintentional wt loss; resolved   Goal:   Pt to meet >/= 90% of their estimated nutrition needs; unmet  Monitor:   Total protein/energy intake, labs, weights, GI profile  Assessment:   The patient is a 73 y.o. year-old male with history of Crohn's disease, iron deficiency anemia, unexplained neutropenia, hypertension, acid reflux, BPH who was sent from gastroenterology because of a combination of nausea, vomiting, and abdominal distention and worsening leukopenia despite prednisone  5/26:  -Pt endorsed an unintentional wt loss of 37 lbs, or 16% body weight in past 3 months d/t n/v and abd distention.  -Has been following largely liquid diet for past 2-3 weeks for PCP. Consumes some soft foods for breakfast-eggs/soft cereals, would consume broth and water for remainder of meals  -Noted weight loss was started as intentional by exercise and portion control; however, prior to liquid diet, pt reported a general disinterest in foods and early satiety. Meals would consist of apple or pt would occasionally tolerate certain mets  -Pt w/out BM for past 8 days, and has hx of constipation. This is likely contributing to feelings of early satiety. MD noted possible bowel obstruction.  -Pt on clear liquid diet- will add Resource Breeze then modify to Ensure Complete for additional kcal/protein  -Family concerned about cost of supplementations- will provide coupons and include high kcal/protein snacks as diet advancement tolerated  5/29: - Pt is now eating 75- 100% of meals - Pt reports good appetite  - Pt had BM yesterday - Pt not interested in continuing oral supplements  - Encouraged pt on following specific diet guidelines for Crohn's Disease - Encouraged pt to ask  Dr. about probiotic supplementation   Height: Ht Readings from Last 1 Encounters:  09/30/13 5' 9.5" (1.765 m)    Weight Status:   Wt Readings from Last 1 Encounters:  09/30/13 194 lb 14.2 oz (88.4 kg)    Re-estimated needs:  Kcal: 1800-2000  Protein: 90-100 gram  Fluid: >/=1800 ml/daily  Skin: WDL  Diet Order: General   Intake/Output Summary (Last 24 hours) at 10/03/13 1343 Last data filed at 10/03/13 1336  Gross per 24 hour  Intake 1072.5 ml  Output      0 ml  Net 1072.5 ml    Last BM: 5/28    Labs:   Recent Labs Lab 09/30/13 1527 10/02/13 0340 10/03/13 0335  NA 134* 135* 136*  K 4.3 4.5 4.4  CL 95* 97 99  CO2 26 27 27   BUN 19 17 20   CREATININE 0.98 1.00 1.06  CALCIUM 8.7 8.3* 8.0*  MG 2.0  --   --   PHOS 4.0  --   --   GLUCOSE 114* 125* 110*    CBG (last 3)  No results found for this basename: GLUCAP,  in the last 72 hours  Scheduled Meds: . ciprofloxacin  400 mg Intravenous Q12H  . feeding supplement (RESOURCE BREEZE)  1 Container Oral BID BM  . finasteride  5 mg Oral QHS  . fluticasone  1 spray Each Nare Daily  . metronidazole  500 mg Intravenous Q8H  . pantoprazole  40 mg Oral Daily  . polyethylene glycol  17 g Oral BID  . predniSONE  20 mg Oral Q breakfast  .  senna-docusate  1 tablet Oral BID    Continuous Infusions:   Carrolyn Leigh, BS Dietetic Intern Pager: [phone]

## 2013-10-03 NOTE — Progress Notes (Signed)
Pt. Received 2 units of RBC's and tolerated procedure well. Will continue to monitor.

## 2013-10-03 NOTE — Progress Notes (Signed)
Patient ID: [name], male   DOB: 28-Mar-1941, 72 y.o.   MRN: [id number]  [facility] PROGRESS NOTE  [name] GTX:[id number] DOB: [birth year] DOA: 09/30/2013 PCP: [name], MD  Brief narrative:  73 y.o. year-old male with history of Crohn's disease, iron deficiency anemia, unexplained neutropenia, hypertension, acid reflux, BPH, who was sent from gastroenterology office for further evaluation of nausea, vomiting, constipation. In addition, it was noted that pt had worsening leukopenia on CBC which requires further evaluation. Pt reports 37 lbs weight loss over the past 3 months. Last BM 8 days prior to this admission.   Active Problems:  Abdominal pain with N/V  - CT abd with several areas of inflammation, ? 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  - palliative care consult pending  - transfuse two units of blood today 5/28 - repeat CBC in AM Crohn's disease  - per GI, no further escalation in medical therapy for Crohn's  - transitioned to Prednisone  Constipation  - placed on Miralax, Senna BID, sorbitol  - had two BM yesterday as well as this AM Hypertension  - reasonable inpatient control  BPH (benign prostatic hyperplasia)  - no signs of obstruction  Protein-calorie malnutrition, severe - secondary to acute illness, AML  - advance diet as pt able to tolerate   Consultants:  GI  Oncology  PCT Procedures/Studies:  Dg Chest 2 View 09/30/2013 Mild basilar atelectasis. Otherwise negative exam.  Abd 1 View 09/30/2013 Non obstructed bowel gas pattern.  CT Biopsy 10/01/2013 CT guided right iliac bone marrow aspiration and core biopsy.  Antibiotics:  Cipro 5/27 -->  Flagyl 5/27 -->   Code Status: Full  Family Communication: Pt  and wife at bedside  Disposition Plan: Home when medically stable   HPI/Subjective: No events overnight.   Objective: Filed Vitals:   10/02/13 2203 10/03/13 0610 10/03/13 1041 10/03/13 1107  BP: 127/72 131/60 124/61 130/70  Pulse: 63 74 104 68  Temp: 97.9 F (36.6 C) 98.4 F (36.9 C) 97.5 F (36.4 C) 98.1 F (36.7 C)  TempSrc: Oral Oral Oral Oral  Resp: _0 Height:      Weight:      SpO2: 100% 100% 100% 100%    Intake/Output Summary (Last 24 hours) at 10/03/13 1107 Last data filed at 10/03/13 1052  Gross per 24 hour  Intake  612.5 ml  Output      0 ml  Net  612.5 ml    Exam:   General:  Pt is alert, follows commands appropriately, not in acute distress  Cardiovascular: Regular rate and rhythm, S1/S2, no murmurs, no rubs, no gallops  Respiratory: Clear to auscultation bilaterally, no wheezing, no crackles, no rhonchi  Abdomen: Soft, non tender, non distended, bowel sounds present, no guarding  Extremities: No edema, pulses DP and PT palpable bilaterally  Neuro: Grossly nonfocal  Data Reviewed: Basic Metabolic Panel:  Recent Labs Lab 09/30/13 1527 10/02/13 0340 10/03/13 0335  NA 134* 135* 136*  K 4.3 4.5 4.4  CL 95* 97 99  CO2 _1 GLUCOSE 114* 125* 110*  BUN _2 CREATININE 0.98 1.00 1.06  CALCIUM 8.7 8.3* 8.0*  MG 2.0  --   --  PHOS 4.0  --   --    Liver Function Tests:  Recent Labs Lab 09/30/13 1527  AST 29  ALT 42  ALKPHOS 121*  BILITOT 1.4*  PROT 6.4  ALBUMIN 2.5*    Recent Labs Lab 09/30/13 1526  LIPASE 9*   CBC:  Recent Labs Lab 09/30/13 1527 10/01/13 0355 10/02/13 0340 10/03/13 0335  WBC 0.9* 1.1* 1.3* 1.0*  NEUTROABS 0.4*  --  0.4*  --   HGB 8.9* 8.0* 7.2* 6.8*  HCT 30.1* 27.5* 25.0* 23.7*  MCV 94.1 94.5 94.7 93.7  PLT 306 270 192 193   Recent Results (from the past 240 hour(s))  URINE CULTURE     Status: None   Collection Time    09/30/13  3:15 PM      Result Value Ref Range Status    Specimen Description URINE, CLEAN CATCH   Final   Special Requests NONE   Final   Culture  Setup Time     Final   Value: 09/30/2013 22:29     Performed at [facility]     Final   Value: 20,OOO COLONIES/ML     Performed at [facility]   Culture     Final   Value: PROTEUS MIRABILIS     Performed at [facility]   Report Status PENDING   Incomplete  CULTURE, BLOOD (ROUTINE X 2)     Status: None   Collection Time    10/01/13  6:35 AM      Result Value Ref Range Status   Specimen Description BLOOD LEFT HAND   Final   Special Requests BOTTLES DRAWN AEROBIC AND ANAEROBIC 10CC   Final   Culture  Setup Time     Final   Value: 10/01/2013 12:13     Performed at [facility]   Culture     Final   Value:        BLOOD CULTURE RECEIVED NO GROWTH TO DATE CULTURE WILL BE HELD FOR 5 DAYS BEFORE ISSUING A FINAL NEGATIVE REPORT     Performed at [facility]   Report Status PENDING   Incomplete  CULTURE, BLOOD (ROUTINE X 2)     Status: None   Collection Time    10/01/13  6:45 AM      Result Value Ref Range Status   Specimen Description BLOOD RIGHT ARM   Final   Special Requests BOTTLES DRAWN AEROBIC AND ANAEROBIC 10CC   Final   Culture  Setup Time     Final   Value: 10/01/2013 12:13     Performed at [facility]   Culture     Final   Value:        BLOOD CULTURE RECEIVED NO GROWTH TO DATE CULTURE WILL BE HELD FOR 5 DAYS BEFORE ISSUING A FINAL NEGATIVE REPORT     Performed at [facility]   Report Status PENDING   Incomplete     Scheduled Meds: . ciprofloxacin  400 mg Intravenous Q12H  . feeding supplement (RESOURCE BREEZE)  1 Container Oral BID BM  . finasteride  5 mg Oral QHS  . fluticasone  1 spray Each Nare Daily  . metronidazole  500 mg Intravenous Q8H  . pantoprazole  40 mg Oral Daily  . polyethylene glycol  17 g Oral BID  . predniSONE  20 mg Oral Q breakfast  . senna-docusate  1 tablet Oral BID   Continuous  Infusions: .  dextrose 5 % and 0.45% NaCl 30 mL/hr at 10/03/13 6834     Theodis Blaze, MD  [facility] Pager [phone]  If 7PM-7AM, please contact night-coverage [email or web address] Password [email or web address] 10/03/2013, 11:07 AM   LOS: 3 days

## 2013-10-03 NOTE — Progress Notes (Signed)
Agree with dietetic intern's note.   Taron Conrey Baron MS, RD, LDN [phone] Pager [phone] After Hours Pager  

## 2013-10-03 NOTE — Progress Notes (Signed)
Pt was lying in bed and awake when I arrived. Pt's wife was bedside and one of the pastors from his church arrived during our visit. Pt said he rested but did not sleep well last night; he noted the fact that he was getting blood. Pt talked about family today and where he grew up. He and his wife were not tearful today but accepting of diagnosis and treatment. Pt and wife were thankful for visit. Ernest Haber Chaplain  10/03/13 1400  Clinical Encounter Type  Visited With Patient and family together

## 2013-10-03 NOTE — Progress Notes (Signed)
PT Cancellation Note  Patient Details Name: CHOUA CHALKER MRN: [id number] DOB: 1940/10/10   Cancelled Treatment:    Reason Eval/Treat Not Completed: Medical issues which prohibited therapy (recent dx. of AML, getting blood)   Shella Maxim [facility] 10/03/2013, 10:21 AM

## 2013-10-04 LAB — TYPE AND SCREEN
ABO/RH(D): O POS
ANTIBODY SCREEN: NEGATIVE
UNIT DIVISION: 0
Unit division: 0

## 2013-10-04 LAB — CBC
HEMATOCRIT: 30.1 % — AB (ref 39.0–52.0)
HEMOGLOBIN: 9 g/dL — AB (ref 13.0–17.0)
MCH: 26.9 pg (ref 26.0–34.0)
MCHC: 29.9 g/dL — AB (ref 30.0–36.0)
MCV: 90.1 fL (ref 78.0–100.0)
Platelets: 170 10*3/uL (ref 150–400)
RBC: 3.34 MIL/uL — AB (ref 4.22–5.81)
RDW: 19.4 % — ABNORMAL HIGH (ref 11.5–15.5)
WBC: 0.8 10*3/uL — CL (ref 4.0–10.5)

## 2013-10-04 LAB — URINE CULTURE

## 2013-10-04 MED ORDER — CIPROFLOXACIN HCL 500 MG PO TABS
500.0000 mg | ORAL_TABLET | Freq: Two times a day (BID) | ORAL | Status: DC
  Administered 2013-10-04 – 2013-10-06 (×5): 500 mg via ORAL
  Filled 2013-10-04 (×7): qty 1

## 2013-10-04 MED ORDER — METRONIDAZOLE 500 MG PO TABS
500.0000 mg | ORAL_TABLET | Freq: Three times a day (TID) | ORAL | Status: DC
  Administered 2013-10-04 – 2013-10-06 (×7): 500 mg via ORAL
  Filled 2013-10-04 (×9): qty 1

## 2013-10-04 MED ORDER — BIOTENE DRY MOUTH MT LIQD
15.0000 mL | Freq: Two times a day (BID) | OROMUCOSAL | Status: DC
  Administered 2013-10-05 – 2013-10-06 (×3): 15 mL via OROMUCOSAL

## 2013-10-04 NOTE — Progress Notes (Signed)
Patient [name]      DOB: 11-24-1940      MAY:[id number]  Family would like to meet on Sunday at 4 pm.  Manasi Dishon L. Lovena Le, MD MBA The Palliative Medicine Team at [facility] Phone: [phone] Pager: [phone]

## 2013-10-04 NOTE — Progress Notes (Signed)
[name] looks much is. Is growing Proteus in his urine. He now is on ciprofloxacin.  He is trying to walk a little bit more. He still feels a little bit weak. Hopefully, this will improve now that he got the blood transfusion.  He is eating better. He's not bleeding. He is going to the bathroom. There does not seem to issue with Crohn's disease.  He's had no cough. He's had no rashes.  Vital signs all stable. Temperature 98.6. Blood pressure 135/73. Head and neck exam shows no ocular or oral lesions. There is no adenopathy in the neck. Lungs are clear. Cardiac exam regular rate and rhythm with no murmurs rubs or bruits. Abdomen soft. No palpable mass. No fluid wave. No palpable liver edge. Spleen not be palpable with deep inspiration peristernally shows some trace edema.  We will go ahead and get out of care consult. I talked to him about this today. He will need to hospice as an outpatient. He understands this and think this is a good idea.   hopefully, he will be in a position to go  home in a couple days. He does wants to be a little bit stronger. I definitely understand this.  Eric E.  2 Cor 12_9-10

## 2013-10-04 NOTE — Progress Notes (Signed)
Patient ID: [name], male   DOB: [birth year], 73 y.o.   MRN: [id number]  [facility] PROGRESS NOTE  [name] [id number]:[id number] DOB: 01/18/1941 DOA: 09/30/2013 PCP: [name], MD  Brief narrative:  73 y.o. year-old male with history of Crohn's disease, iron deficiency anemia, unexplained neutropenia, hypertension, acid reflux, BPH, who was sent from gastroenterology office for further evaluation of nausea, vomiting, constipation. In addition, it was noted that pt had worsening leukopenia on CBC which requires further evaluation. Pt reports 37 lbs weight loss over the past 3 months. Last BM 8 days prior to this admission.   Active Problems:  Abdominal pain with N/V  - CT abd with several areas of inflammation, ? 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  - palliative care consult pending  - transfused two units of blood 5/28, CBC pending this AM, may need more blood products  - repeat CBC in AM  - appreciate oncology input  Crohn's disease  - per GI, no further escalation in medical therapy for Crohn's  - transitioned to Prednisone  Constipation  - placed on Miralax, Senna BID, sorbitol  - had BM yesterday as well as this AM  Hypertension  - reasonable inpatient control  BPH (benign prostatic hyperplasia)  - no signs of obstruction  Protein-calorie malnutrition, severe - secondary to acute illness, AML  - advance diet as pt able to tolerate   Consultants:  GI  Oncology  PCT Procedures/Studies:  Dg Chest 2 View 09/30/2013 Mild basilar atelectasis. Otherwise negative exam.  Abd 1 View 09/30/2013 Non obstructed bowel gas pattern.   CT Biopsy 10/01/2013 CT guided right iliac bone marrow aspiration and core biopsy.  Antibiotics:  Cipro 5/27 -->  Flagyl 5/27 -->   Code Status: Full  Family Communication: Pt and wife at bedside  Disposition Plan: Home when medically stable   HPI/Subjective: No events overnight.   Objective: Filed Vitals:   10/03/13 1551 10/03/13 1615 10/03/13 2252 10/04/13 0751  BP: 143/79 145/78 135/73 141/69  Pulse: 72 64 60 70  Temp: 98.7 F (37.1 C) 98.6 F (37 C) 98.6 F (37 C) 98.9 F (37.2 C)  TempSrc: Oral Oral Oral Oral  Resp: 16 16 16 16   Height:      Weight:      SpO2: 100% 100% 100% 100%    Intake/Output Summary (Last 24 hours) at 10/04/13 1217 Last data filed at 10/04/13 0900  Gross per 24 hour  Intake 2838.5 ml  Output      0 ml  Net 2838.5 ml    Exam:   General:  Pt is alert, follows commands appropriately, not in acute distress  Cardiovascular: Regular rate and rhythm, S1/S2, no murmurs, no rubs, no gallops  Respiratory: Clear to auscultation bilaterally, no wheezing, no crackles, no rhonchi  Abdomen: Soft, non tender, non distended, bowel sounds present, no guarding  Data Reviewed: Basic Metabolic Panel:  Recent Labs Lab 09/30/13 1527 10/02/13 0340 10/03/13 0335  NA 134* 135* 136*  K 4.3 4.5 4.4  CL 95* 97 99  CO2 26 27 27   GLUCOSE 114* 125* 110*  BUN 19  17 20  CREATININE 0.98 1.00 1.06  CALCIUM 8.7 8.3* 8.0*  MG 2.0  --   --   PHOS 4.0  --   --    Liver Function Tests:  Recent Labs Lab 09/30/13 1527  AST 29  ALT 42  ALKPHOS 121*  BILITOT 1.4*  PROT 6.4  ALBUMIN 2.5*    Recent Labs Lab 09/30/13 1526  LIPASE 9*   CBC:  Recent Labs Lab 09/30/13 1527 10/01/13 0355 10/02/13 0340 10/03/13 0335  WBC 0.9* 1.1* 1.3* 1.0*  NEUTROABS 0.4*  --  0.4*  --   HGB 8.9* 8.0* 7.2* 6.8*  HCT 30.1* 27.5* 25.0* 23.7*  MCV 94.1 94.5 94.7 93.7  PLT 306 270 192 193     Recent Results (from the past 240 hour(s))  URINE CULTURE      Status: None   Collection Time    09/30/13  3:15 PM      Result Value Ref Range Status   Specimen Description URINE, CLEAN CATCH   Final   Special Requests NONE   Final   Culture  Setup Time     Final   Value: 09/30/2013 22:29     Performed at [facility]     Final   Value: 20,OOO COLONIES/ML     Performed at [facility]   Culture     Final   Value: PROTEUS MIRABILIS     Performed at [facility]   Report Status 10/04/2013 FINAL   Final   Organism ID, Bacteria PROTEUS MIRABILIS   Final  CULTURE, BLOOD (ROUTINE X 2)     Status: None   Collection Time    10/01/13  6:35 AM      Result Value Ref Range Status   Specimen Description BLOOD LEFT HAND   Final   Special Requests BOTTLES DRAWN AEROBIC AND ANAEROBIC 10CC   Final   Culture  Setup Time     Final   Value: 10/01/2013 12:13     Performed at [facility]   Culture     Final   Value:        BLOOD CULTURE RECEIVED NO GROWTH TO DATE CULTURE WILL BE HELD FOR 5 DAYS BEFORE ISSUING A FINAL NEGATIVE REPORT     Performed at [facility]   Report Status PENDING   Incomplete  CULTURE, BLOOD (ROUTINE X 2)     Status: None   Collection Time    10/01/13  6:45 AM      Result Value Ref Range Status   Specimen Description BLOOD RIGHT ARM   Final   Special Requests BOTTLES DRAWN AEROBIC AND ANAEROBIC 10CC   Final   Culture  Setup Time     Final   Value: 10/01/2013 12:13     Performed at [facility]   Culture     Final   Value:        BLOOD CULTURE RECEIVED NO GROWTH TO DATE CULTURE WILL BE HELD FOR 5 DAYS BEFORE ISSUING A FINAL NEGATIVE REPORT     Performed at [facility]   Report Status PENDING   Incomplete     Scheduled Meds: . ciprofloxacin  500 mg Oral BID  . finasteride  5 mg Oral QHS  . fluticasone  1 spray Each Nare Daily  . metroNIDAZOLE  500 mg Oral 3 times per day  . pantoprazole  40 mg Oral Daily  . polyethylene glycol  17 g Oral BID  . predniSONE   20 mg Oral Q breakfast  . senna-docusate  1 tablet Oral BID   Continuous Infusions:    Theodis Blaze, MD  [facility] Pager [phone]  If 7PM-7AM, please contact night-coverage [email or web address] Password [email or web address] 10/04/2013, 12:17 PM   LOS: 4 days

## 2013-10-04 NOTE — Progress Notes (Signed)
Patient [id number] [name]      DOB: 01/28/1941      FSF:[id number]  Spoke with patient's wife she has to coordinate a ride and her children schedules .  She will call back with a time and date for [place] meeting.   Teneil Shiller L. Lovena Le, MD MBA The Palliative Medicine Team at [facility] Phone: [phone] Pager: [phone]

## 2013-10-05 DIAGNOSIS — Z515 Encounter for palliative care: Secondary | ICD-10-CM

## 2013-10-05 LAB — BASIC METABOLIC PANEL
BUN: 13 mg/dL (ref 6–23)
CO2: 27 meq/L (ref 19–32)
CREATININE: 0.94 mg/dL (ref 0.50–1.35)
Calcium: 8 mg/dL — ABNORMAL LOW (ref 8.4–10.5)
Chloride: 105 mEq/L (ref 96–112)
GFR calc Af Amer: >90 mL/min (ref >90)
GFR calc non Af Amer: 80 mL/min — ABNORMAL LOW (ref >90)
Glucose, Bld: 139 mg/dL — ABNORMAL HIGH (ref 70–99)
Potassium: 4 mEq/L (ref 3.7–5.3)
Sodium: 141 mEq/L (ref 137–147)

## 2013-10-05 LAB — CBC
HCT: 31.6 % — ABNORMAL LOW (ref 39.0–52.0)
HEMOGLOBIN: 9.4 g/dL — AB (ref 13.0–17.0)
MCH: 27.2 pg (ref 26.0–34.0)
MCHC: 29.7 g/dL — ABNORMAL LOW (ref 30.0–36.0)
MCV: 91.6 fL (ref 78.0–100.0)
Platelets: 192 10*3/uL (ref 150–400)
RBC: 3.45 MIL/uL — ABNORMAL LOW (ref 4.22–5.81)
RDW: 19.1 % — ABNORMAL HIGH (ref 11.5–15.5)
WBC: 1 10*3/uL — CL (ref 4.0–10.5)

## 2013-10-05 LAB — OCCULT BLOOD X 1 CARD TO LAB, STOOL: Fecal Occult Bld: POSITIVE — AB

## 2013-10-05 MED ORDER — GI COCKTAIL ~~LOC~~
30.0000 mL | Freq: Three times a day (TID) | ORAL | Status: DC | PRN
  Filled 2013-10-05: qty 30

## 2013-10-05 NOTE — Progress Notes (Addendum)
Patient ID: [name], male   DOB: 29-Oct-1940, 73 y.o.   MRN: [id number]  [facility] PROGRESS NOTE  MARTINO TOMPSON LXB:[id number] DOB: [birth year] DOA: 09/30/2013 PCP: [name], MD  Brief narrative:  73 y.o. year-old male with history of Crohn's disease, iron deficiency anemia, unexplained neutropenia, hypertension, acid reflux, BPH, who was sent from gastroenterology office for further evaluation of nausea, vomiting, constipation. In addition, it was noted that pt had worsening leukopenia on CBC which requires further evaluation. Pt reports 37 lbs weight loss over the past 3 months. Last BM 8 days prior to this admission.   Active Problems:  Abdominal pain with N/V  - CT abd with several areas of inflammation, ? 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  - palliative care consult pending  - transfused two units of blood 5/28, with appropriate increase in hg/hct  - repeat CBC in AM  - appreciate oncology input  - no need for transfusion this AM Crohn's disease  - per GI, no further escalation in medical therapy for Crohn's  - transitioned to Prednisone  Constipation  - placed on Miralax, Senna BID, sorbitol  - had BM yesterday as well as this AM  - stop senna per pt's wishes  Hypertension  - reasonable inpatient control off antihypertensive regimen with verapamil and losartan  BPH (benign prostatic hyperplasia)  - no signs of obstruction  Protein-calorie malnutrition, severe - secondary to acute illness, AML  - advanced diet as pt able to tolerate   Consultants:  GI  Oncology  PCT Procedures/Studies:   Dg Chest 2 View 09/30/2013 Mild basilar atelectasis. Otherwise negative exam.  Abd 1 View 09/30/2013 Non obstructed bowel gas pattern.  CT Biopsy 10/01/2013 CT guided right iliac bone marrow aspiration and core biopsy.  Antibiotics:  Cipro 5/27 -->  Flagyl 5/27 -->   Code Status: Full  Family Communication: Pt and wife at bedside  Disposition Plan: Home in AM  HPI/Subjective: No events overnight.   Objective: Filed Vitals:   10/04/13 0751 10/04/13 1415 10/04/13 2145 10/05/13 0457  BP: 141/69 117/58 136/76 140/64  Pulse: 70 69 68 75  Temp: 98.9 F (37.2 C) 99.6 F (37.6 C) 98.3 F (36.8 C) 98.1 F (36.7 C)  TempSrc: Oral Oral Oral Oral  Resp: 16 18 16 16   Height:      Weight:      SpO2: 100% 100% 100% 100%    Intake/Output Summary (Last 24 hours) at 10/05/13 1315 Last data filed at 10/05/13 0900  Gross per 24 hour  Intake   1200 ml  Output      0 ml  Net   1200 ml    Exam:   General:  Pt is alert, follows commands appropriately, not in acute distress  Cardiovascular: Regular rate and rhythm, S1/S2, no murmurs, no rubs, no gallops  Respiratory: Clear to auscultation bilaterally, no wheezing, no crackles, no rhonchi  Abdomen: Soft, non tender, non distended, bowel sounds present, no guarding  Data Reviewed: Basic Metabolic Panel:  Recent Labs Lab 09/30/13 1527 10/02/13 0340 10/03/13 0335 10/05/13 0450  NA 134* 135* 136* 141  K 4.3 4.5 4.4 4.0  CL 95* 97 99 105  CO2 26 27 27 27   GLUCOSE 114* 125* 110* 139*  BUN 19 17 20 13   CREATININE 0.98 1.00 1.06 0.94  CALCIUM 8.7 8.3* 8.0* 8.0*  MG 2.0  --   --   --   PHOS 4.0  --   --   --    Liver Function Tests:  Recent Labs Lab 09/30/13 1527  AST 29  ALT 42  ALKPHOS 121*  BILITOT 1.4*  PROT 6.4  ALBUMIN 2.5*    Recent Labs Lab 09/30/13 1526  LIPASE 9*   CBC:  Recent Labs Lab 09/30/13 1527 10/01/13 0355 10/02/13 0340 10/03/13 0335 10/04/13 1232 10/05/13 0450  WBC 0.9* 1.1* 1.3* 1.0*  0.8* 1.0*  NEUTROABS 0.4*  --  0.4*  --   --   --   HGB 8.9* 8.0* 7.2* 6.8* 9.0* 9.4*  HCT 30.1* 27.5* 25.0* 23.7* 30.1* 31.6*  MCV 94.1 94.5 94.7 93.7 90.1 91.6  PLT 306 270 192 193 170 192    Recent Results (from the past 240 hour(s))  URINE CULTURE     Status: None   Collection Time    09/30/13  3:15 PM      Result Value Ref Range Status   Specimen Description URINE, CLEAN CATCH   Final   Special Requests NONE   Final   Culture  Setup Time     Final   Value: 09/30/2013 22:29     Performed at [facility]     Final   Value: 20,OOO COLONIES/ML     Performed at [facility]   Culture     Final   Value: PROTEUS MIRABILIS     Performed at [facility]   Report Status 10/04/2013 FINAL   Final   Organism ID, Bacteria PROTEUS MIRABILIS   Final  CULTURE, BLOOD (ROUTINE X 2)     Status: None   Collection Time    10/01/13  6:35 AM      Result Value Ref Range Status   Specimen Description BLOOD LEFT HAND   Final   Special Requests BOTTLES DRAWN AEROBIC AND ANAEROBIC 10CC   Final   Culture  Setup Time     Final   Value: 10/01/2013 12:13     Performed at [facility]   Culture     Final   Value:        BLOOD CULTURE RECEIVED NO GROWTH TO DATE CULTURE WILL BE HELD FOR 5 DAYS BEFORE ISSUING A FINAL NEGATIVE REPORT     Performed at [facility]   Report Status PENDING   Incomplete  CULTURE, BLOOD (ROUTINE X 2)     Status: None   Collection Time    10/01/13  6:45 AM      Result Value Ref Range Status   Specimen Description BLOOD RIGHT ARM   Final   Special Requests BOTTLES DRAWN AEROBIC AND ANAEROBIC 10CC   Final   Culture  Setup Time     Final   Value: 10/01/2013 12:13     Performed at [facility]   Culture     Final   Value:        BLOOD CULTURE RECEIVED NO GROWTH TO DATE CULTURE WILL BE HELD FOR 5 DAYS BEFORE ISSUING A FINAL NEGATIVE REPORT     Performed at [facility]  Report Status PENDING   Incomplete      Scheduled Meds: . antiseptic oral rinse  15 mL Mouth Rinse BID  . ciprofloxacin  500 mg Oral BID  . finasteride  5 mg Oral QHS  . fluticasone  1 spray Each Nare Daily  . metroNIDAZOLE  500 mg Oral 3 times per day  . pantoprazole  40 mg Oral Daily  . polyethylene glycol  17 g Oral BID  . predniSONE  20 mg Oral Q breakfast   Continuous Infusions:  Theodis Blaze, MD  [facility] Pager [phone]  If 7PM-7AM, please contact night-coverage [email or web address] Password [email or web address] 10/05/2013, 1:15 PM   LOS: 5 days

## 2013-10-05 NOTE — Consult Note (Signed)
Patient [name]      DOB: 1940-05-27      UMP:[id number]     Consult Note from the Palliative Medicine Team at [place] Requested by: Dr. Doyle Askew     PCP: [name], MD Reason for Consultation: [place]   Phone Number:[phone]  Assessment of patients Current state: 73 year-old white male with a history of lymphoproliferative disorder admitted with abdominal pain nausea and vomiting felt to be having a Crohn's flare. Patient was also treated for urinary tract infection. 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 I met with the patient, his wife , 2 sons and a daughter. The patient was able to participate in goals of care and expressed his concern over first and foremost his diagnosis and subsequently multiple financial concerns that he had for his spouse as they are in a retirement years and were not planning to both have cancer. We reviewed his advanced care planning and talked about how to cope with his current illness. We reviewed hospice care and how he can access that at any time during the course of his illness as being the best time to do so. He agreed to reveal most form and was anxious to get home. Please see my note from the date of service. Patient agreed to meet with me in the following data review his most form after speaking with his spouse about the options presented .   Goals of Care: 1.  Code Status: Patient desires a full code while in hospital however he stated that he was likely going to accept a DO NOT RESUSCITATE at discharge will review this with him and provide appropriate documentation for discharge   2. Scope of Treatment: At this time the patient is accepting treatment for treatable illnesses however he realizes that he cannot have his AML treated.  4. Disposition: Home. Patient to talk with his wife about hospice versus home  health   3. Symptom Management:   1. Pain continue Percocet as needed.  2. Crohn's disease continue prednisone with bowel regimen  4. Psychosocial: patient was in the [employer] and is currently receiving VA benefits he is married to his wife who has recurrent ovarian cancer. He has 2 sons and a daughter who are active in his care   5. Spiritual: offered as needed         Patient Documents Completed or Given: Document Given Completed  Advanced Directives Pkt    MOST    DNR    Gone from My Sight    Hard Choices      Brief HPI: 73 year old white male with a known history of Crohn's disease with chronic anemia and unexplained neutropenia was admitted from his GI office because of nausea vomiting and abdominal pain with worsening leukopenia. Patient had been on prednisone. Concern was that his Crohn's disease with possibly flaring. He carried a general diagnosis of a lymphoproliferative disorder , During this admission he underwent a bone marrow biopsy was found to have acute myeloid ischemia with blasts. Patient has received an is and has decided to not pursue further treatment. He wanted to talk about his options regarding discharge to home and assistance with possible hospice care. We reviewed the condition with his family and the options for hospice to assist in his care we also reviewed his advanced care planning and I provided him with  a most form to review for completion prior to discharge.Please see my note from the date of service.  ROS: Some nausea generalized weakness but no vomiting poor appetite generalized fatigue    PMH:  Past Medical History  Diagnosis Date  . Crohn's disease   . Iron deficiency anemia   . Leukopenia   . Hypertension   . BPH (benign prostatic hyperplasia)   . 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 Kidney stones      PSH: Past Surgical History  Procedure Laterality Date  . Lumbar fusion      L5-S1  . Kidney stone  surgery    . Cataract extraction Right   . Cardiac catheterization  2001   I have reviewed the [place] and SH and  If appropriate update it with new information. Allergies  Allergen Reactions  . Maxzide [Triamterene-Hctz]   . Metoprolol    Scheduled Meds: . antiseptic oral rinse  15 mL Mouth Rinse BID  . ciprofloxacin  500 mg Oral BID  . finasteride  5 mg Oral QHS  . fluticasone  1 spray Each Nare Daily  . metroNIDAZOLE  500 mg Oral 3 times per day  . pantoprazole  40 mg Oral Daily  . polyethylene glycol  17 g Oral BID  . predniSONE  20 mg Oral Q breakfast   Continuous Infusions:  PRN Meds:.bisacodyl, gi cocktail, HYDROmorphone (DILAUDID) injection, ondansetron (ZOFRAN) IV, ondansetron, oxyCODONE-acetaminophen    BP 135/80  Pulse 83  Temp(Src) 98.3 F (36.8 C) (Oral)  Resp 18  Ht 5' 9.5" (1.765 m)  Wt 88.4 kg (194 lb 14.2 oz)  BMI 28.38 kg/m2  SpO2 100%   PPS: 40-50%   Intake/Output Summary (Last 24 hours) at 10/05/13 2117 Last data filed at 10/05/13 1839  Gross per 24 hour  Intake   1560 ml  Output      0 ml  Net   1560 ml    Physical Exam:  General: No acute distress, HEENT:  Pupils equal round reactive to light extraocular muscles are intact his membranes are moist speech is clear Chest:   Chest is decreased but clear to auscultation rhonchorous wheezes CVS:  Regular rate rhythm positive S1 and S2 I don't appreciate an S3 or S4 Abdomen: soft mildly overweight nontender nondistended positive bowel sounds Ext:  Areas of ecchymosis Neuro: awake alert oriented cranial nerves II through XII are intact  Labs: CBC    Component Value Date/Time   WBC 1.0* 10/05/2013 0450   WBC 0.7* 09/23/2013 0811   RBC 3.45* 10/05/2013 0450   RBC 2.69* 09/23/2013 0811   HGB 9.4* 10/05/2013 0450   HGB 7.4* 09/23/2013 0811   HCT 31.6* 10/05/2013 0450   HCT 26.1* 09/23/2013 0811   PLT 192 10/05/2013 0450   PLT 277 09/23/2013 0811   MCV 91.6 10/05/2013 0450   MCV 97.0 09/23/2013 0811    MCH 27.2 10/05/2013 0450   MCH 27.5 09/23/2013 0811   MCHC 29.7* 10/05/2013 0450   MCHC 28.4* 09/23/2013 0811   RDW 19.1* 10/05/2013 0450   RDW 18.9* 09/23/2013 0811   LYMPHSABS 0.6* 10/02/2013 0340   LYMPHSABS 0.5* 09/23/2013 0811   MONOABS 0.3 10/02/2013 0340   MONOABS 0.0* 09/23/2013 0811   EOSABS 0.0 10/02/2013 0340   EOSABS 0.0 09/23/2013 0811   BASOSABS 0.0 10/02/2013 0340   BASOSABS 0.0 09/23/2013 0811     CMP     Component Value Date/Time  NA 141 10/05/2013 0450   NA 140 09/23/2013 0811   K 4.0 10/05/2013 0450   K 3.6 09/23/2013 0811   CL 105 10/05/2013 0450   CO2 27 10/05/2013 0450   CO2 26 09/23/2013 0811   GLUCOSE 139* 10/05/2013 0450   GLUCOSE 104 09/23/2013 0811   BUN 13 10/05/2013 0450   BUN 12.1 09/23/2013 0811   CREATININE 0.94 10/05/2013 0450   CREATININE 0.9 09/23/2013 0811   CALCIUM 8.0* 10/05/2013 0450   CALCIUM 8.5 09/23/2013 0811   PROT 6.4 09/30/2013 1527   PROT 5.9* 09/23/2013 0811   ALBUMIN 2.5* 09/30/2013 1527   ALBUMIN 2.6* 09/23/2013 0811   AST 29 09/30/2013 1527   AST 7 09/23/2013 0811   ALT 42 09/30/2013 1527   ALT 8 09/23/2013 0811   ALKPHOS 121* 09/30/2013 1527   ALKPHOS 49 09/23/2013 0811   BILITOT 1.4* 09/30/2013 1527   BILITOT 1.03 09/23/2013 0811   GFRNONAA 80* 10/05/2013 0450   GFRAA >90 10/05/2013 0450    Chest Xray Reviewed/Impressions:  Mild basilar atelectasis. Otherwise negative exam.    CT abd and pelvis:  1. Two areas of mild diverticulitis are noted, 1 at the junction of  the descending and sigmoid colon and the other along the mid sigmoid  colon. No extraluminal air or abscess.  2. Wall thickening of the distal ileum consistent with an infectious  or inflammatory ileitis.  3. Fluid attenuation surrounds the gallbladder. Although no stone is  seen, acute cholecystitis should be considered in the proper  clinical setting. The fluid in the gallbladder may be reactive to  the adjacent small bowel pathology.  4. No other evidence of an acute  abnormality. Multiple chronic  findings as detailed   Time In Time Out Total Time Spent with Patient Total Overall Time  4 PM   5:15 PM   75 minutes   75 minutes     Greater than 50%  of this time was spent counseling and coordinating care related to the above assessment and plan.   Amalea Ottey L. Lovena Le, MD MBA The Palliative Medicine Team at [facility] Phone: [phone] Pager: [phone]

## 2013-10-05 NOTE — Consult Note (Signed)
Patient BB:UYZJQ HENDRIXX SEVERIN      DOB: 10-20-1940      DUK:[id number]  Summary of goals of care; full note to follow:  Met with patient, spouse Noonie, daughter, and two sons  Patient articulate about his recent diagnosis, concerns that were expressed were financial instability, ability to care for his wife who has recurrent ovarian cancer, desire to leave legacy for children and grandchildren.  Talked with patient about coping with is new diagnosis.  He has already made some plans for funeral payments.  He states that he will work with his family on further plans along this line as time moves on.  He and I spoke about his code status .  He states that in the hospital he has elected a full code status but he is thinking about discharging with a DNR.  We talked about a MOST form which he will review.  I reviewed Hospice general services and he is going to think about whether to ask them to help care for him at discharge versus calling them in down the road.  Hard Choices books were given to help stimulate further discussion related to code status and general grief work.    Exam was deferred.  Greater than 50%  of this time was spent counseling and coordinating care related to the above assessment and plan.  Total time:  400 pm-515 pm  Recommend:  1.  Honor Full code status for now.  2.  Nausea: patient responding or oral medications, continue prn  3.  Will return in the am to assist with further questions and review the MOST form   Dustin Burrill L. Lovena Le, MD MBA The Palliative Medicine Team at [facility] Phone: [phone] Pager: [phone]

## 2013-10-06 LAB — CBC
HEMATOCRIT: 29.3 % — AB (ref 39.0–52.0)
Hemoglobin: 8.6 g/dL — ABNORMAL LOW (ref 13.0–17.0)
MCH: 27.2 pg (ref 26.0–34.0)
MCHC: 29.4 g/dL — AB (ref 30.0–36.0)
MCV: 92.7 fL (ref 78.0–100.0)
Platelets: 164 10*3/uL (ref 150–400)
RBC: 3.16 MIL/uL — ABNORMAL LOW (ref 4.22–5.81)
RDW: 18.4 % — AB (ref 11.5–15.5)
WBC: 1.1 10*3/uL — AB (ref 4.0–10.5)

## 2013-10-06 LAB — BASIC METABOLIC PANEL
BUN: 13 mg/dL (ref 6–23)
CHLORIDE: 103 meq/L (ref 96–112)
CO2: 28 mEq/L (ref 19–32)
CREATININE: 1.08 mg/dL (ref 0.50–1.35)
Calcium: 7.8 mg/dL — ABNORMAL LOW (ref 8.4–10.5)
GFR calc Af Amer: 76 mL/min — ABNORMAL LOW (ref >90)
GFR calc non Af Amer: 66 mL/min — ABNORMAL LOW (ref >90)
Glucose, Bld: 86 mg/dL (ref 70–99)
Potassium: 4 mEq/L (ref 3.7–5.3)
Sodium: 139 mEq/L (ref 137–147)

## 2013-10-06 MED ORDER — FLUTICASONE PROPIONATE 50 MCG/ACT NA SUSP: 1.0000 | Freq: Every day | NASAL | Status: AC

## 2013-10-06 MED ORDER — METRONIDAZOLE 500 MG PO TABS: 500.0000 mg | ORAL_TABLET | Freq: Three times a day (TID) | ORAL | Status: DC

## 2013-10-06 MED ORDER — CIPROFLOXACIN HCL 500 MG PO TABS: 500.0000 mg | ORAL_TABLET | Freq: Two times a day (BID) | ORAL | Status: DC

## 2013-10-06 MED ORDER — PREDNISONE 20 MG PO TABS: 20.0000 mg | ORAL_TABLET | Freq: Every day | ORAL | Status: AC

## 2013-10-06 MED ORDER — OXYCODONE-ACETAMINOPHEN 5-325 MG PO TABS: 1.0000 | ORAL_TABLET | ORAL | Status: AC | PRN

## 2013-10-06 NOTE — Discharge Summary (Signed)
Physician Discharge Summary  [name] KLK:[id number] DOB: [birth year] DOA: 09/30/2013  PCP: [name], MD  Admit date: 09/30/2013 Discharge date: 10/06/2013  Recommendations for Outpatient Follow-up:  1. Pt will need to follow up with PCP in 2-3 weeks post discharge 2. Please obtain BMP to evaluate electrolytes and kidney function 3. Please also check CBC to evaluate Hg and Hct levels 4. Continue Cipro and Flagyl for diverticulitis   Discharge Diagnoses: AML Active Problems:   Crohn's disease   Iron deficiency anemia   Leukopenia   Hypertension   BPH (benign prostatic hyperplasia)   Weakness   Protein-calorie malnutrition, severe  Discharge Condition: Stable  Diet recommendation: Heart healthy diet discussed in details   Brief narrative:  73 y.o. year-old male with history of Crohn's disease, iron deficiency anemia, unexplained neutropenia, hypertension, acid reflux, BPH, who was sent from gastroenterology office for further evaluation of nausea, vomiting, constipation. In addition, it was noted that pt had worsening leukopenia on CBC which requires further evaluation. Pt reports 37 lbs weight loss over the past 3 months. Last BM 8 days prior to this admission.   Active Problems:  Abdominal pain with N/V  - CT abd with several areas of inflammation, ? Diverticulitis  - continue Cipro and Flagyl day #5/14, stopped solumedrol 5/28 and transitioned to low dose prednisone  - pt reports feeling better this AM and tolerating regular diet well  - appreciate GI and oncology input  - transitioned to PO Cipro and flagyl and pt tolerating well, will need to continue upon discharge  UTI  - with proteus sensitive to Cipro which he is already on day #6 AML  - new diagnosis based on bone marrow biopsy  - palliative care consult pending  - transfused two units of blood 5/28, with appropriate increase in hg/hct  - repeat CBC in an outpatient setting  - appreciate oncology input   - no need for transfusion this AM  Crohn's disease  - per GI, no further escalation in medical therapy for Crohn's  - transitioned to Prednisone  Constipation  - placed on Miralax, Senna BID, sorbitol  - had BM yesterday as well as this AM  - stop senna per pt's wishes  Hypertension  - reasonable inpatient control off antihypertensive regimen with verapamil and losartan  BPH (benign prostatic hyperplasia)  - no signs of obstruction  Protein-calorie malnutrition, severe - secondary to acute illness, AML  - advanced diet as pt able to tolerate   Consultants:  GI  Oncology  PCT Procedures/Studies:  Dg Chest 2 View 09/30/2013 Mild basilar atelectasis. Otherwise negative exam.  Abd 1 View 09/30/2013 Non obstructed bowel gas pattern.  CT Biopsy 10/01/2013 CT guided right iliac bone marrow aspiration and core biopsy.  Antibiotics:  Cipro 5/27 -->  Flagyl 5/27 -->   Code Status: Full  Family Communication: Pt and wife at bedside  Disposition Plan: Home in AM   Discharge Exam: Filed Vitals:   10/06/13 0546  BP: 143/70  Pulse: 65  Temp: 98 F (36.7 C)  Resp: 20   Filed Vitals:   10/05/13 0457 10/05/13 1535 10/05/13 2209 10/06/13 0546  BP: 140/64 135/80 147/75 143/70  Pulse: 75 83 80 65  Temp: 98.1 F (36.7 C) 98.3 F (36.8 C) 98.1 F (36.7 C) 98 F (36.7 C)  TempSrc: Oral Oral Oral Oral  Resp: _0 Height:      Weight:      SpO2: 100% 100% 100% 100%  General: Pt is alert, follows commands appropriately, not in acute distress Cardiovascular: Regular rate and rhythm, S1/S2 +, no murmurs, no rubs, no gallops Respiratory: Clear to auscultation bilaterally, no wheezing, no crackles, no rhonchi Abdominal: Soft, non tender, non distended, bowel sounds +, no guarding Extremities: no edema, no cyanosis, pulses palpable bilaterally DP and PT Neuro: Grossly nonfocal  Discharge Instructions  Discharge Instructions   Diet - low sodium heart healthy    Complete  by:  As directed      Increase activity slowly    Complete by:  As directed             Medication List    STOP taking these medications       losartan 50 MG tablet  Commonly known as:  COZAAR     verapamil 240 MG CR tablet  Commonly known as:  CALAN-SR      TAKE these medications       ciprofloxacin 500 MG tablet  Commonly known as:  CIPRO  Take 1 tablet (500 mg total) by mouth 2 (two) times daily.     finasteride 5 MG tablet  Commonly known as:  PROSCAR  Take 5 mg by mouth at bedtime.     fluticasone 50 MCG/ACT nasal spray  Commonly known as:  FLONASE  Place 1 spray into both nostrils daily.     metroNIDAZOLE 500 MG tablet  Commonly known as:  FLAGYL  Take 1 tablet (500 mg total) by mouth every 8 (eight) hours.     omeprazole 20 MG capsule  Commonly known as:  PRILOSEC  Take 20 mg by mouth daily before breakfast.     oxyCODONE-acetaminophen 5-325 MG per tablet  Commonly known as:  PERCOCET/ROXICET  Take 1-2 tablets by mouth every 3 (three) hours as needed for moderate pain.     predniSONE 20 MG tablet  Commonly known as:  DELTASONE  Take 1 tablet (20 mg total) by mouth daily with breakfast.           Follow-up Information   Schedule an appointment as soon as possible for a visit with [name], MD.   Specialty:  Family Medicine   Contact information:   [address], [place] [address] [phone]        The results of significant diagnostics from this hospitalization (including imaging, microbiology, ancillary and laboratory) are listed below for reference.     Microbiology: Recent Results (from the past 240 hour(s))  URINE CULTURE     Status: None   Collection Time    09/30/13  3:15 PM      Result Value Ref Range Status   Specimen Description URINE, CLEAN CATCH   Final   Special Requests NONE   Final   Culture  Setup Time     Final   Value: 09/30/2013 22:29     Performed at [facility] Count      Final   Value: 20,OOO COLONIES/ML     Performed at [facility]   Culture     Final   Value: PROTEUS MIRABILIS     Performed at [facility]   Report Status 10/04/2013 FINAL   Final   Organism ID, Bacteria PROTEUS MIRABILIS   Final  CULTURE, BLOOD (ROUTINE X 2)     Status: None   Collection Time    10/01/13  6:35 AM      Result Value Ref Range Status  Specimen Description BLOOD LEFT HAND   Final   Special Requests BOTTLES DRAWN AEROBIC AND ANAEROBIC 10CC   Final   Culture  Setup Time     Final   Value: 10/01/2013 12:13     Performed at [facility]   Culture     Final   Value:        BLOOD CULTURE RECEIVED NO GROWTH TO DATE CULTURE WILL BE HELD FOR 5 DAYS BEFORE ISSUING A FINAL NEGATIVE REPORT     Performed at [facility]   Report Status PENDING   Incomplete  CULTURE, BLOOD (ROUTINE X 2)     Status: None   Collection Time    10/01/13  6:45 AM      Result Value Ref Range Status   Specimen Description BLOOD RIGHT ARM   Final   Special Requests BOTTLES DRAWN AEROBIC AND ANAEROBIC 10CC   Final   Culture  Setup Time     Final   Value: 10/01/2013 12:13     Performed at [facility]   Culture     Final   Value:        BLOOD CULTURE RECEIVED NO GROWTH TO DATE CULTURE WILL BE HELD FOR 5 DAYS BEFORE ISSUING A FINAL NEGATIVE REPORT     Performed at [facility]   Report Status PENDING   Incomplete     Labs: Basic Metabolic Panel:  Recent Labs Lab 09/30/13 1527 10/02/13 0340 10/03/13 0335 10/05/13 0450 10/06/13 0422  NA 134* 135* 136* 141 139  K 4.3 4.5 4.4 4.0 4.0  CL 95* 97 99 105 103  CO2 _0 GLUCOSE 114* 125* 110* 139* 86  BUN _1 CREATININE 0.98 1.00 1.06 0.94 1.08  CALCIUM 8.7 8.3* 8.0* 8.0* 7.8*  MG 2.0  --   --   --   --   PHOS 4.0  --   --   --   --    Liver Function Tests:  Recent Labs Lab 09/30/13 1527  AST 29  ALT 42  ALKPHOS 121*  BILITOT 1.4*  PROT 6.4  ALBUMIN 2.5*     Recent Labs Lab 09/30/13 1526  LIPASE 9*   CBC:  Recent Labs Lab 09/30/13 1527  10/02/13 0340 10/03/13 0335 10/04/13 1232 10/05/13 0450 10/06/13 0422  WBC 0.9*  < > 1.3* 1.0* 0.8* 1.0* 1.1*  NEUTROABS 0.4*  --  0.4*  --   --   --   --   HGB 8.9*  < > 7.2* 6.8* 9.0* 9.4* 8.6*  HCT 30.1*  < > 25.0* 23.7* 30.1* 31.6* 29.3*  MCV 94.1  < > 94.7 93.7 90.1 91.6 92.7  PLT 306  < > 192 193 170 192 164  < > = values in this interval not displayed.  SIGNED: Time coordinating discharge: Over 30 minutes  Theodis Blaze, MD  [facility] 10/06/2013, 11:15 AM Pager [phone]  If 7PM-7AM, please contact night-coverage [email or web address] Password [email or web address]

## 2013-10-06 NOTE — Care Management Note (Signed)
    Page 1 of 1   10/06/2013     12:10:54 PM CARE MANAGEMENT NOTE 10/06/2013  Patient:  [name], [name]   Account Number:  [id number]  Date Initiated:  10/06/2013  Documentation initiated by:  Sunday Spillers  Subjective/Objective Assessment:   73 yo male admitted with Crohn's Flare, new dx AML. PTA lived at home with spouse.     Action/Plan:   Home when stable   Anticipated DC Date:  10/06/2013   Anticipated DC Plan:  HOME/SELF CARE  In-house referral  Financial Counselor  Hospice / [place]  CM consult      [facility] Choice  HOSPICE   Choice offered to / List presented to:  [name]           Status of service:   Medicare Important Message given?  YES (If response is "NO", the following Medicare IM given date [place] will be blank) Date Medicare IM given:  10/06/2013 Date Additional Medicare IM given:    Discharge Disposition:  HOME/SELF CARE  Per UR Regulation:  Reviewed for med. necessity/level of care/duration of stay  If discussed at [place] of Stay Meetings, dates discussed:    Comments:  10-06-13 [place] 1200 Spoke with patient and spouse at bedside. Recieved a referral from Dr. Lovena Le to offer hospice choice. Provided patient and spouse with a list for choice. They chose [facility]. Contacted [facility] for referral. Patient then decided to wait on initiating home hospice and may contact them after d/c. No other HH needs identified. Patient concerned about financial impact of hospital stay, provided them with the contact information to billing to follow up at d/c. Patient appreciative of assistance.

## 2013-10-06 NOTE — Progress Notes (Signed)
Notified by RN CM Vinnie Level of patient being offered choice for Hospice services and pt choice of [facility] of  ([facility]). Writer went to patient's room to review services, pt and wife present. Pt alert, oriented, in no apparent physical distress. Pt very pleasant, he and his wife requested that the be able to go home to talk more about the timing of starting hospice services. Pt stated " I have had a lot of things thrown at me and I just need to get home and process it all". Pt's wife in agreement.  Information brochure and contact information for [facility] referral center given to pt. Dr. Doyle Askew in pt room at time of visit and aware of pt's decision. Writer spoke with [facility] Vinnie Level and advised her of pt choice to wait at this time. [facility] referral center also notified.  Flo Shanks RN, BSN, [facility] Liaison

## 2013-10-06 NOTE — Progress Notes (Signed)
Patient WC:NPSZJ DELL BRINER      DOB: [birth year]      UDI:[id number]  Met with Mr. Griggs to complete MOST form.  Patient states if he should be found deceased with no heart beat at home he would prefer to not be resuscitated.  He reports while he is feeling well he would like to be able to investigate minor health issues but is generally moving toward the comfort care category as things change.  He would like antibiotics and he would want IV fluids for a defined period but no tube feeds.  He would like his wife to be the primary surrogate but would want his eldest son to assist with medical decision making.  Asked CM to assist with financial concers.   Total time  840- 905 am  Shantil Vallejo L. Lovena Le, MD MBA The Palliative Medicine Team at [facility] Phone: [phone] Pager: [phone]

## 2013-10-06 NOTE — Progress Notes (Signed)
Patient discharged home, all discharge medications and instructions reviewed and questions answered. Patient to be assisted to vehicle by wheelchair.  

## 2013-10-06 NOTE — Discharge Instructions (Signed)
Acute Myeloid Leukemia, Adult Acute myeloid leukemia (AML) is a rapid growth cancer of the blood and the soft tissue inside your bones (bone marrow). Normally, your bone marrow makes blast cells that develop into important white blood cells called myeloid cells (and several other types of mature blood cells). These mature cells help to fight infection, carry oxygen, and stop bleeding. With AML, the bone marrow makes abnormal, or unformed myeloid blast cells. These abnormal cells develop into leukemia cells and occupy space in the blood where healthy cells need room. The rapidly growing leukemia cells begin to take over. Leukemia cells do not fight infection or carry out other important jobs in the blood, and symptoms of infection and illness appear. There are several types of AML depending on the stage and characteristics of the leukemia cells. CAUSES  Experts are not clear on what causes the bone marrow to produce abnormal cells that lead to leukemia. For the most part, it does not appear to be genetic, but related to other external factors.  RISK FACTORS Risk factors include:  Age 50 years and older.  Male.  Smoking.  History of chemotherapy or radiation therapy.  Exposure to chemicals.  Other blood disorders.  Genetic disorders, such as Down Syndrome. SYMPTOMS   Poor appetite.  Tiring easily.  Weakness.  Shortness of breath.  Repeat infections.  Low-grade fevers.  Bone pain aches.  Joint pain and aches.  Abdominal pain.  Pale skin.  Bruising.  Nosebleeds and easy bleeding from minor cuts.  Slow healing of cuts.  Spots on the skin.  Swollen glands.  Headache.  Weight loss.  Swollen gums.  Lumps under the skin. DIAGNOSIS  The diagnosis of AML is made by tests such as:  Blood tests to check blood cell counts and the shape of the blood cells.  Sampling parts of bone that make blood cells (bone marrow).  Genetic testing.  Sampling spinal fluid for  leukemia cells.  A biopsy of lumps to check for leukemia cells.  X-ray exams, ultrasonography, or CT scans. TREATMENT  The type of AML diagnosed will guide treatment options. Treatment can last for several months up to 2 3 years. Treatment aims to destroy leukemia cells as well as stop new diseased cells from growing. Treatment may include:  Chemotherapy.  Radiation therapy to kill cancer cells.  Targeted medicines to treat specific chromosomal mutations.  Stem cell transplant to replace diseased bone marrow with healthy donor bone marrow.  Experimental treatments through clinical trials.  In rare cases, surgery. HOME CARE INSTRUCTIONS  When you are on chemotherapy:  You and and any visitors should wash hands often. Wash hands before meals, after being outside, and after using the toilet.  Keep your teeth and gums clean and well cared for. Use a soft toothbrush.  Talk with your health care provider about the safety of immunizations.  When visiting a healthcare facility, ask about side entrances or waiting areas where you will not be exposed to infections.  Only take over-the-counter or prescription medicines for pain, discomfort, or fever as directed by your health care provider.  Use a good sun block and clothing to prevent sun exposure.  Usually, it is recommended that other family members receive an influenza shot every year. SEEK MEDICAL CARE IF:   You have a cough or cold symptoms.  You have a sore throat.  You have painful urination.  You have frequent diarrhea.  You have frequent vomiting.  You have a skin rash.  You  have been exposed to chickenpox or measles, especially if you have not been immunized or are not immune to these illnesses. °SEEK IMMEDIATE MEDICAL CARE IF:  °· You have a fever. °· You have chills. °· You have trouble breathing. °· You have blood in you °Document Released: 02/12/2013 Document Reviewed: 12/16/2012 °ExitCare® Patient Information  ©2014 [employer]. ° °

## 2013-10-06 NOTE — Progress Notes (Signed)
Physical Therapy Treatment Patient Details Name: [name] MRN: [id number] DOB: [birth year] Today's Date: 10/06/2013    History of Present Illness Admitted 09/30/13 withNausea, vomiting, abdominal distension, iron deficiiency anemia, and h/o crohn's dz.    PT Comments    Progressing with mobility. Started pt on exercise program. Issued handout for pt/wife to follow at home.   Follow Up Recommendations  No PT follow up (pt declines HHPT)     Equipment Recommendations  None recommended by PT    Recommendations for Other Services       Precautions / Restrictions Precautions Precautions: Fall Restrictions Weight Bearing Restrictions: No    Mobility  Bed Mobility Overal bed mobility: Modified Independent                Transfers Overall transfer level: Needs assistance Equipment used: Rolling walker (2 wheeled) Transfers: Sit to/from Stand Sit to Stand: Supervision            Ambulation/Gait Ambulation/Gait assistance: Min guard Ambulation Distance (Feet): 175 Feet Assistive device: Rolling walker (2 wheeled) Gait Pattern/deviations: Step-through pattern;Decreased stride length     General Gait Details: slow gait speed. pt reported 1 instance of lightheadedness with change in direction/turning.    Stairs            Wheelchair Mobility    Modified Rankin (Stroke Patients Only)       Balance                                    Cognition Arousal/Alertness: Awake/alert Behavior During Therapy: WFL for tasks assessed/performed Overall Cognitive Status: Within Functional Limits for tasks assessed                      Exercises General Exercises - Lower Extremity Ankle Circles/Pumps: AROM;15 reps;Supine Quad Sets: AROM;Both;15 reps;Supine Long Arc Quad: AROM;Both;Seated;10 reps Heel Slides: AROM;Both;15 reps;Supine Straight Leg Raises: AROM;Both;10 reps;Supine Heel Raises: AROM;Both;10 reps;Supine    General  Comments        Pertinent Vitals/Pain Pt reported mild bil foot pain with heel raises. Able to continue with activity.    Home Living                      Prior Function            PT Goals (current goals can now be found in the care plan section) Progress towards PT goals: Progressing toward goals    Frequency  Min 3X/week    PT Plan Current plan remains appropriate    Co-evaluation             End of Session   Activity Tolerance: Patient tolerated treatment well Patient left: in bed;with call bell/phone within reach;with family/visitor present     Time: 9924-2683 PT Time Calculation (min): 24 min  Charges:  $Gait Training: 8-22 mins $Therapeutic Exercise: 8-22 mins                    G Codes:      Weston Anna, MPT Pager: [phone]

## 2013-10-07 LAB — CULTURE, BLOOD (ROUTINE X 2)
Culture: NO GROWTH
Culture: NO GROWTH

## 2013-10-10 LAB — TISSUE HYBRIDIZATION (BONE MARROW)-NCBH

## 2013-10-10 LAB — CHROMOSOME ANALYSIS, BONE MARROW

## 2013-10-10 MED ORDER — ONDANSETRON HCL 4 MG PO TABS: 4.0000 mg | ORAL_TABLET | Freq: Three times a day (TID) | ORAL | Status: AC | PRN

## 2013-10-17 ENCOUNTER — Telehealth: Payer: Self-pay | Admitting: Oncology

## 2013-10-17 NOTE — Telephone Encounter (Signed)
returned call to Hughes Better, nurse navigator @ [facility] called requesting appt for pt to see FS. returned call and lmonvm for peyton that pt was seen 5/19 and has f/u on 7/28. peyton asked to call office back on monday and s/w desk nurse if pt is having new issues that would cause him to need a f/u prior to 7/28. called pt to confirm 7/28 appt and pt is aware of appt but states he is having new issues and does not have/remember the full  details but does need a sooner appt. pt informed that l left message for peyton asking that she call office back w/more details. Pt will also touch base w/peyton Monday.

## 2013-10-20 ENCOUNTER — Encounter: Payer: Self-pay | Admitting: Oncology

## 2013-10-24 ENCOUNTER — Telehealth: Payer: Self-pay | Admitting: Oncology

## 2013-10-24 NOTE — Telephone Encounter (Signed)
pt transferring care to [facility] per [place] rep

## 2013-10-27 ENCOUNTER — Telehealth: Payer: Self-pay | Admitting: *Deleted

## 2013-10-27 NOTE — Telephone Encounter (Signed)
Received call from pt stating that pt was referred to [facility] to see Dr. Florene Glen on Tues 10/28/13 by [facility].  Pt stated all records had been faxed to Dr. Abel Presto office; however, the office requested slides from [facility] and slides from labs to be sent to Dr. Florene Glen.  Spoke with Vata in pathology dept.  Per Shelly Flatten, a request from Dr. Abel Presto office needs to be faxed to pathology dept in order for slides to be sent via [facility] today.  Vata stated the slides will be at Dr. Abel Presto office  tomorrow but not guarantee to be there by 12pm for pt's appt.  Called pt and instructed pt to inform Dr. Abel Presto office of above request.  Gave pt direct fax number to [place].   Pt voiced understanding. Pt's  Phone   [phone].

## 2013-11-12 ENCOUNTER — Other Ambulatory Visit: Payer: Self-pay | Admitting: *Deleted

## 2013-11-12 DIAGNOSIS — D500 Iron deficiency anemia secondary to blood loss (chronic): Secondary | ICD-10-CM

## 2013-11-12 MED ORDER — SODIUM CHLORIDE 0.9 % IJ SOLN
10.0000 mL | Freq: Once | INTRAMUSCULAR | Status: DC
  Filled 2013-11-12: qty 10

## 2013-11-14 ENCOUNTER — Telehealth: Payer: Self-pay | Admitting: Oncology

## 2013-11-14 NOTE — Telephone Encounter (Signed)
lvm for pt for nxt week appt....advised pt to pick up sched

## 2013-11-17 ENCOUNTER — Other Ambulatory Visit: Payer: Self-pay

## 2013-11-17 ENCOUNTER — Telehealth: Payer: Self-pay | Admitting: Medical Oncology

## 2013-11-17 ENCOUNTER — Other Ambulatory Visit: Payer: Medicare Other

## 2013-11-17 ENCOUNTER — Other Ambulatory Visit (HOSPITAL_BASED_OUTPATIENT_CLINIC_OR_DEPARTMENT_OTHER): Payer: Medicare Other

## 2013-11-17 ENCOUNTER — Telehealth: Payer: Self-pay | Admitting: Oncology

## 2013-11-17 ENCOUNTER — Ambulatory Visit (HOSPITAL_BASED_OUTPATIENT_CLINIC_OR_DEPARTMENT_OTHER): Payer: Medicare Other

## 2013-11-17 DIAGNOSIS — K921 Melena: Secondary | ICD-10-CM

## 2013-11-17 DIAGNOSIS — D709 Neutropenia, unspecified: Secondary | ICD-10-CM

## 2013-11-17 DIAGNOSIS — D649 Anemia, unspecified: Secondary | ICD-10-CM

## 2013-11-17 DIAGNOSIS — K5090 Crohn's disease, unspecified, without complications: Secondary | ICD-10-CM

## 2013-11-17 DIAGNOSIS — D509 Iron deficiency anemia, unspecified: Secondary | ICD-10-CM

## 2013-11-17 DIAGNOSIS — D500 Iron deficiency anemia secondary to blood loss (chronic): Secondary | ICD-10-CM

## 2013-11-17 DIAGNOSIS — Z452 Encounter for adjustment and management of vascular access device: Secondary | ICD-10-CM

## 2013-11-17 LAB — COMPREHENSIVE METABOLIC PANEL (CC13)
ALK PHOS: 51 U/L (ref 40–150)
ALT: 24 U/L (ref 0–55)
AST: 17 U/L (ref 5–34)
Albumin: 2.4 g/dL — ABNORMAL LOW (ref 3.5–5.0)
Anion Gap: 9 mEq/L (ref 3–11)
BUN: 15.3 mg/dL (ref 7.0–26.0)
CO2: 27 mEq/L (ref 22–29)
Calcium: 8.4 mg/dL (ref 8.4–10.4)
Chloride: 101 mEq/L (ref 98–109)
Creatinine: 0.8 mg/dL (ref 0.7–1.3)
Glucose: 93 mg/dl (ref 70–140)
Potassium: 4.2 mEq/L (ref 3.5–5.1)
Sodium: 137 mEq/L (ref 136–145)
Total Bilirubin: 0.79 mg/dL (ref 0.20–1.20)
Total Protein: 6.4 g/dL (ref 6.4–8.3)

## 2013-11-17 LAB — CBC WITH DIFFERENTIAL/PLATELET
BASO%: 0 % (ref 0.0–2.0)
Basophils Absolute: 0 10*3/uL (ref 0.0–0.1)
EOS%: 0 % (ref 0.0–7.0)
Eosinophils Absolute: 0 10*3/uL (ref 0.0–0.5)
HEMATOCRIT: 30.1 % — AB (ref 38.4–49.9)
HGB: 9.4 g/dL — ABNORMAL LOW (ref 13.0–17.1)
LYMPH#: 0.4 10*3/uL — AB (ref 0.9–3.3)
LYMPH%: 62.9 % — AB (ref 14.0–49.0)
MCH: 28.9 pg (ref 27.2–33.4)
MCHC: 31.2 g/dL — AB (ref 32.0–36.0)
MCV: 92.6 fL (ref 79.3–98.0)
MONO#: 0 10*3/uL — ABNORMAL LOW (ref 0.1–0.9)
MONO%: 6.5 % (ref 0.0–14.0)
NEUT#: 0.2 10*3/uL — CL (ref 1.5–6.5)
NEUT%: 30.6 % — AB (ref 39.0–75.0)
Platelets: 48 10*3/uL — ABNORMAL LOW (ref 140–400)
RBC: 3.25 10*6/uL — AB (ref 4.20–5.82)
RDW: 16.7 % — ABNORMAL HIGH (ref 11.0–14.6)
WBC: 0.6 10*3/uL — CL (ref 4.0–10.3)

## 2013-11-17 LAB — MAGNESIUM (CC13): Magnesium: 2 mg/dl (ref 1.5–2.5)

## 2013-11-17 MED ORDER — HEPARIN SOD (PORK) LOCK FLUSH 100 UNIT/ML IV SOLN
500.0000 [IU] | Freq: Once | INTRAVENOUS | Status: AC
  Administered 2013-11-17: 250 [IU] via INTRAVENOUS
  Filled 2013-11-17: qty 5

## 2013-11-17 MED ORDER — SODIUM CHLORIDE 0.9 % IJ SOLN
10.0000 mL | INTRAMUSCULAR | Status: DC | PRN
  Administered 2013-11-17: 10 mL via INTRAVENOUS
  Filled 2013-11-17: qty 10

## 2013-11-17 NOTE — Telephone Encounter (Signed)
Critical labs faxed and called to [facility] at [phone], fax [phone].

## 2013-11-17 NOTE — Patient Instructions (Signed)
PICC Home Guide A peripherally inserted central catheter (PICC) is a long, thin, flexible tube that is inserted into a vein in the upper arm. It is a form of intravenous (IV) access. It is considered to be a "central" line because the tip of the PICC ends in a large vein in your chest. This large vein is called the superior vena cava (SVC). The PICC tip ends in the SVC because there is a lot of blood flow in the SVC. This allows medicines and IV fluids to be quickly distributed throughout the body. The PICC is inserted using a sterile technique by a specially trained nurse or physician. After the PICC is inserted, a chest X-ray exam is done to be sure it is in the correct place.  A PICC may be placed for different reasons, such as:  To give medicines and liquid nutrition that can only be given through a central line. Examples are:  Certain antibiotic treatments.  Chemotherapy.  Total parenteral nutrition (TPN).  To take frequent blood samples.  To give IV fluids and blood products.  If there is difficulty placing a peripheral intravenous (PIV) catheter. If taken care of properly, a PICC can remain in place for several months. A PICC can also allow a person to go home from the hospital early. Medicine and PICC care can be managed at home by a family member or home health care team. WHAT PROBLEMS CAN HAPPEN WHEN I HAVE A PICC? Problems with a PICC can occasionally occur. These may include:  A blood clot (thrombus) forming in or at the tip of the PICC. This can cause the PICC to become clogged. A clot-dissolving medicine called tissue plasminogen activator (tPA) can be given through the PICC to help break up the clot.  Inflammation of the vein (phlebitis) in which the PICC is placed. Signs of inflammation may include redness, pain at the insertion site, red streaks, or being able to feel a "cord" in the vein where the PICC is located.  Infection in the PICC or at the insertion site. Signs of  infection may include fever, chills, redness, swelling, or pus drainage from the PICC insertion site.  PICC movement (malposition). The PICC tip may move from its original position due to excessive physical activity, forceful coughing, sneezing, or vomiting.  A break or cut in the PICC. It is important to not use scissors near the PICC.  Nerve or tendon irritation or injury during PICC insertion. WHAT SHOULD I KEEP IN MIND ABOUT ACTIVITIES WHEN I HAVE A PICC?  You may bend your arm and move it freely. If your PICC is near or at the bend of your elbow, avoid activity with repeated motion at the elbow.  Rest at home for the remainder of the day following PICC line insertion.  Avoid lifting heavy objects as instructed by your health care provider.  Avoid using a crutch with the arm on the same side as your PICC. You may need to use a walker. WHAT SHOULD I KNOW ABOUT MY PICC DRESSING?  Keep your PICC bandage (dressing) clean and dry to prevent infection.  Ask your health care provider when you may shower. Ask your health care provider to teach you how to wrap the PICC when you do take a shower.  Change the PICC dressing as instructed by your health care provider.  Change your PICC dressing if it becomes loose or wet. WHAT SHOULD I KNOW ABOUT PICC CARE?  Check the PICC insertion site daily for   leakage, redness, swelling, or pain.  Do not take a bath, swim, or use hot tubs when you have a PICC. Cover PICC line with clear plastic wrap and tape to keep it dry while showering.  Flush the PICC as directed by your health care provider. Let your health care provider know right away if the PICC is difficult to flush or does not flush. Do not use force to flush the PICC.  Do not use a syringe that is less than 10 mL to flush the PICC.  Never pull or tug on the PICC.  Avoid blood pressure checks on the arm with the PICC.  Keep your PICC identification card with you at all times.  Do not  take the PICC out yourself. Only a trained clinical professional should remove the PICC. SEEK IMMEDIATE MEDICAL CARE IF:  Your PICC is accidently pulled all the way out. If this happens, cover the insertion site with a bandage or gauze dressing. Do not throw the PICC away. Your health care provider will need to inspect it.  Your PICC was tugged or pulled and has partially come out. Do not  push the PICC back in.  There is any type of drainage, redness, or swelling where the PICC enters the skin.  You cannot flush the PICC, it is difficult to flush, or the PICC leaks around the insertion site when it is flushed.  You hear a "flushing" sound when the PICC is flushed.  You have pain, discomfort, or numbness in your arm, shoulder, or jaw on the same side as the PICC.  You feel your heart "racing" or skipping beats.  You notice a hole or tear in the PICC.  You develop chills or a fever. MAKE SURE YOU:   Understand these instructions.  Will watch your condition.  Will get help right away if you are not doing well or get worse. Document Released: 10/29/2002 Document Revised: 02/12/2013 Document Reviewed: 12/30/2012 ExitCare Patient Information 2015 [employer]. This information is not intended to replace advice given to you by your health care provider. Make sure you discuss any questions you have with your health care provider.  

## 2013-11-17 NOTE — Telephone Encounter (Signed)
Pt called back to r/s due to nurse did not have everything to do the flush, r/s pt for labs/flush....[name]

## 2013-11-20 ENCOUNTER — Telehealth: Payer: Self-pay | Admitting: Oncology

## 2013-11-20 NOTE — Telephone Encounter (Signed)
Pt cancelled due to in home care nurse will be doing the flush/labs and will be sending results to the Dr...Marland KitchenMarland KitchenKJ

## 2013-11-21 ENCOUNTER — Other Ambulatory Visit: Payer: Medicare Other

## 2013-11-24 ENCOUNTER — Other Ambulatory Visit: Payer: Medicare Other

## 2013-11-25 ENCOUNTER — Telehealth: Payer: Self-pay | Admitting: Oncology

## 2013-11-25 ENCOUNTER — Other Ambulatory Visit: Payer: Self-pay | Admitting: *Deleted

## 2013-11-25 NOTE — Telephone Encounter (Signed)
S/w pt regarding 7/24 appt. Pt says his appts are usually mons and thurs just in case he needs blood on fridays. Gave pt appt for 7/23 @ 1.45pm to coordinate with wife RT appt.

## 2013-11-25 NOTE — Progress Notes (Signed)
Spoke with patient, r/s lab and flush from 11/28/13 to Thursday 11/27/13 per protocol from [facility]. Patient may need transfusion on Friday the 24th. pof to schedulers

## 2013-11-27 ENCOUNTER — Ambulatory Visit (HOSPITAL_BASED_OUTPATIENT_CLINIC_OR_DEPARTMENT_OTHER): Payer: Medicare Other

## 2013-11-27 ENCOUNTER — Ambulatory Visit (HOSPITAL_COMMUNITY)
Admission: RE | Admit: 2013-11-27 | Discharge: 2013-11-27 | Disposition: A | Discharge status: Home / Self Care | Payer: Medicare Other | Source: Ambulatory Visit | Attending: Oncology | Admitting: Oncology

## 2013-11-27 ENCOUNTER — Other Ambulatory Visit (HOSPITAL_BASED_OUTPATIENT_CLINIC_OR_DEPARTMENT_OTHER): Payer: Medicare Other

## 2013-11-27 ENCOUNTER — Ambulatory Visit (HOSPITAL_COMMUNITY): Admission: RE | Admit: 2013-11-27 | Payer: Medicare Other | Source: Ambulatory Visit

## 2013-11-27 ENCOUNTER — Other Ambulatory Visit: Payer: Medicare Other | Admitting: *Deleted

## 2013-11-27 VITALS — BP 119/71 | HR 79 | Temp 98.6°F

## 2013-11-27 VITALS — BP 136/82 | HR 80 | Temp 98.7°F | Resp 20

## 2013-11-27 DIAGNOSIS — D469 Myelodysplastic syndrome, unspecified: Secondary | ICD-10-CM

## 2013-11-27 DIAGNOSIS — D509 Iron deficiency anemia, unspecified: Secondary | ICD-10-CM

## 2013-11-27 DIAGNOSIS — Z452 Encounter for adjustment and management of vascular access device: Secondary | ICD-10-CM

## 2013-11-27 DIAGNOSIS — D649 Anemia, unspecified: Secondary | ICD-10-CM

## 2013-11-27 DIAGNOSIS — D500 Iron deficiency anemia secondary to blood loss (chronic): Secondary | ICD-10-CM

## 2013-11-27 LAB — CBC WITH DIFFERENTIAL/PLATELET
BASO%: 0 % (ref 0.0–2.0)
Basophils Absolute: 0 10*3/uL (ref 0.0–0.1)
EOS%: 0 % (ref 0.0–7.0)
Eosinophils Absolute: 0 10*3/uL (ref 0.0–0.5)
HCT: 31.9 % — ABNORMAL LOW (ref 38.4–49.9)
HEMOGLOBIN: 10.1 g/dL — AB (ref 13.0–17.1)
LYMPH%: 57.3 % — AB (ref 14.0–49.0)
MCH: 29.5 pg (ref 27.2–33.4)
MCHC: 31.7 g/dL — ABNORMAL LOW (ref 32.0–36.0)
MCV: 93.3 fL (ref 79.3–98.0)
MONO#: 0 10*3/uL — AB (ref 0.1–0.9)
MONO%: 1.3 % (ref 0.0–14.0)
NEUT%: 41.4 % (ref 39.0–75.0)
NEUTROS ABS: 0.3 10*3/uL — AB (ref 1.5–6.5)
Platelets: 10 10*3/uL — CL (ref 140–400)
RBC: 3.42 10*6/uL — ABNORMAL LOW (ref 4.20–5.82)
RDW: 16.4 % — ABNORMAL HIGH (ref 11.0–14.6)
WBC: 0.8 10*3/uL — AB (ref 4.0–10.3)
lymph#: 0.4 10*3/uL — ABNORMAL LOW (ref 0.9–3.3)
nRBC: 0 % (ref 0–0)

## 2013-11-27 LAB — COMPREHENSIVE METABOLIC PANEL (CC13)
ALT: 33 U/L (ref 0–55)
ANION GAP: 5 meq/L (ref 3–11)
AST: 17 U/L (ref 5–34)
Albumin: 2.5 g/dL — ABNORMAL LOW (ref 3.5–5.0)
Alkaline Phosphatase: 69 U/L (ref 40–150)
BUN: 16.9 mg/dL (ref 7.0–26.0)
CO2: 30 mEq/L — ABNORMAL HIGH (ref 22–29)
CREATININE: 0.9 mg/dL (ref 0.7–1.3)
Calcium: 8.4 mg/dL (ref 8.4–10.4)
Chloride: 104 mEq/L (ref 98–109)
Glucose: 109 mg/dl (ref 70–140)
Potassium: 4.7 mEq/L (ref 3.5–5.1)
Sodium: 139 mEq/L (ref 136–145)
Total Bilirubin: 0.64 mg/dL (ref 0.20–1.20)
Total Protein: 6.2 g/dL — ABNORMAL LOW (ref 6.4–8.3)

## 2013-11-27 LAB — IRON AND TIBC CHCC
%SAT: 23 % (ref 20–55)
Iron: 39 ug/dL — ABNORMAL LOW (ref 42–163)
TIBC: 174 ug/dL — ABNORMAL LOW (ref 202–409)
UIBC: 135 ug/dL (ref 117–376)

## 2013-11-27 LAB — FERRITIN CHCC: Ferritin: 1810 ng/ml — ABNORMAL HIGH (ref 22–316)

## 2013-11-27 LAB — TECHNOLOGIST REVIEW

## 2013-11-27 MED ORDER — SODIUM CHLORIDE 0.9 % IJ SOLN
10.0000 mL | INTRAMUSCULAR | Status: DC | PRN
  Administered 2013-11-27: 10 mL via INTRAVENOUS
  Filled 2013-11-27: qty 10

## 2013-11-27 MED ORDER — SODIUM CHLORIDE 0.9 % IV SOLN
250.0000 mL | Freq: Once | INTRAVENOUS | Status: AC
  Administered 2013-11-27: 250 mL via INTRAVENOUS

## 2013-11-27 MED ORDER — HEPARIN SOD (PORK) LOCK FLUSH 100 UNIT/ML IV SOLN
500.0000 [IU] | Freq: Once | INTRAVENOUS | Status: AC
  Administered 2013-11-27: 500 [IU] via INTRAVENOUS
  Filled 2013-11-27: qty 5

## 2013-11-27 NOTE — Patient Instructions (Signed)
PICC Home Guide A peripherally inserted central catheter (PICC) is a long, thin, flexible tube that is inserted into a vein in the upper arm. It is a form of intravenous (IV) access. It is considered to be a "central" line because the tip of the PICC ends in a large vein in your chest. This large vein is called the superior vena cava (SVC). The PICC tip ends in the SVC because there is a lot of blood flow in the SVC. This allows medicines and IV fluids to be quickly distributed throughout the body. The PICC is inserted using a sterile technique by a specially trained nurse or physician. After the PICC is inserted, a chest X-ray exam is done to be sure it is in the correct place.  A PICC may be placed for different reasons, such as:  To give medicines and liquid nutrition that can only be given through a central line. Examples are:  Certain antibiotic treatments.  Chemotherapy.  Total parenteral nutrition (TPN).  To take frequent blood samples.  To give IV fluids and blood products.  If there is difficulty placing a peripheral intravenous (PIV) catheter. If taken care of properly, a PICC can remain in place for several months. A PICC can also allow a person to go home from the hospital early. Medicine and PICC care can be managed at home by a family member or home health care team. WHAT PROBLEMS CAN HAPPEN WHEN I HAVE A PICC? Problems with a PICC can occasionally occur. These may include the following:  A blood clot (thrombus) forming in or at the tip of the PICC. This can cause the PICC to become clogged. A clot-dissolving medicine called tissue plasminogen activator (tPA) can be given through the PICC to help break up the clot.  Inflammation of the vein (phlebitis) in which the PICC is placed. Signs of inflammation may include redness, pain at the insertion site, red streaks, or being able to feel a "cord" in the vein where the PICC is located.  Infection in the PICC or at the insertion  site. Signs of infection may include fever, chills, redness, swelling, or pus drainage from the PICC insertion site.  PICC movement (malposition). The PICC tip may move from its original position due to excessive physical activity, forceful coughing, sneezing, or vomiting.  A break or cut in the PICC. It is important to not use scissors near the PICC.  Nerve or tendon irritation or injury during PICC insertion. WHAT SHOULD I KEEP IN MIND ABOUT ACTIVITIES WHEN I HAVE A PICC?  You may bend your arm and move it freely. If your PICC is near or at the bend of your elbow, avoid activity with repeated motion at the elbow.  Rest at home for the remainder of the day following PICC line insertion.  Avoid lifting heavy objects as instructed by your health care provider.  Avoid using a crutch with the arm on the same side as your PICC. You may need to use a walker. WHAT SHOULD I KNOW ABOUT MY PICC DRESSING?  Keep your PICC bandage (dressing) clean and dry to prevent infection.  Ask your health care provider when you may shower. Ask your health care provider to teach you how to wrap the PICC when you do take a shower.  Change the PICC dressing as instructed by your health care provider.  Change your PICC dressing if it becomes loose or wet. WHAT SHOULD I KNOW ABOUT PICC CARE?  Check the PICC insertion site   daily for leakage, redness, swelling, or pain.  Do not take a bath, swim, or use hot tubs when you have a PICC. Cover PICC line with clear plastic wrap and tape to keep it dry while showering.  Flush the PICC as directed by your health care provider. Let your health care provider know right away if the PICC is difficult to flush or does not flush. Do not use force to flush the PICC.  Do not use a syringe that is less than 10 mL to flush the PICC.  Never pull or tug on the PICC.  Avoid blood pressure checks on the arm with the PICC.  Keep your PICC identification card with you at all  times.  Do not take the PICC out yourself. Only a trained clinical professional should remove the PICC. SEEK IMMEDIATE MEDICAL CARE IF:  Your PICC is accidentally pulled all the way out. If this happens, cover the insertion site with a bandage or gauze dressing. Do not throw the PICC away. Your health care provider will need to inspect it.  Your PICC was tugged or pulled and has partially come out. Do not  push the PICC back in.  There is any type of drainage, redness, or swelling where the PICC enters the skin.  You cannot flush the PICC, it is difficult to flush, or the PICC leaks around the insertion site when it is flushed.  You hear a "flushing" sound when the PICC is flushed.  You have pain, discomfort, or numbness in your arm, shoulder, or jaw on the same side as the PICC.  You feel your heart "racing" or skipping beats.  You notice a hole or tear in the PICC.  You develop chills or a fever. MAKE SURE YOU:   Understand these instructions.  Will watch your condition.  Will get help right away if you are not doing well or get worse. Document Released: 10/29/2002 Document Revised: 09/08/2013 Document Reviewed: 12/30/2012 ExitCare Patient Information 2015 [employer]. This information is not intended to replace advice given to you by your health care provider. Make sure you discuss any questions you have with your health care provider.  

## 2013-11-27 NOTE — Patient Instructions (Signed)
Platelet Transfusion Information °This is information about transfusions of platelets. Platelets are tiny cells made by the bone marrow and found in the blood. When a blood vessel is damaged, platelets rush to the damaged area to help form a clot. This begins the healing process. When platelets get very low, your blood may have trouble clotting. This may be from: °· Illness. °· Blood disorder. °· Chemotherapy to treat cancer. °Often, lower platelet counts do not cause problems.  °Platelets usually last for 7 to 10 days. If they are not used in an injury, they are broken down by the liver or spleen. °Symptoms of low platelet count include: °· Nosebleeds. °· Bleeding gums. °· Heavy periods. °· Bruising and tiny blood spots in the skin. °¨ Pinpoint spots of bleeding (petechiae). °¨ Larger bruises (purpura). °· Bleeding can be more serious if it happens in the brain or bowel. °Platelet transfusions are often used to keep the platelet count at an acceptable level. Serious bleeding due to low platelets is uncommon. °RISKS AND COMPLICATIONS °Severe side effects from platelet transfusions are uncommon. Minor reactions may include: °· Itching. °· Rashes. °· High temperature and shivering. °Medications are available to stop transfusion reactions. Let your health care provider know if you develop any of the above problems.  °If you are having platelet transfusions frequently, they may get less effective. This is called becoming refractory to platelets. It is uncommon. This can happen from non-immune causes and immune causes. Non-immune causes include: °· High temperatures. °· Some medications. °· An enlarged spleen. °Immune causes happen when your body discovers the platelets are not your own and begins making antibodies against them. The antibodies kill the platelets quickly. Even with platelet transfusions, you may still notice problems with bleeding or bruising. Let your health care providers know about this. Other things  can be done to help if this happens.  °BEFORE THE PROCEDURE  °· Your health care provider will check your platelet count regularly. °· If the platelet count is too low, it may be necessary to have a platelet transfusion. °· This is more important before certain procedures with a risk of bleeding, such as a spinal tap. °· Platelet transfusion reduces the risk of bleeding during or after the procedure. °· Except in emergencies, giving a transfusion requires a written consent. °Before blood is taken from a donor, a complete history is taken to make sure the person has no history of previous diseases, nor engages in risky social behavior. Examples of this are intravenous drug use or sexual activity with multiple partners. This could lead to infected blood or blood products being used. This history is taken in spite of the extensive testing to make sure the blood is safe. All blood products transfused are tested to make sure it is a match for the person getting the blood. It is also checked for infections. Blood is the safest it has ever been. The risk of getting an infection is very low. °PROCEDURE °· The platelets are stored in small plastic bags that are kept at a low temperature. °· Each bag is called a unit and sometimes two units are given. They are given through an intravenous line by drip infusion over about one-half hour. °· Usually blood is collected from multiple people to get enough to transfuse. °· Sometimes, the platelets are collected from a single person. This is done using a special machine that separates the platelets from the blood. The machine is called an apheresis machine. Platelets collected in this   way are called apheresed platelets. Apheresed platelets reduce the risk of becoming sensitive to the platelets. This lowers the chances of having a transfusion reaction. °· As it only takes a short time to give the platelets, this treatment can be given in an outpatient department. Platelets can also be  given before or after other treatments. °SEEK IMMEDIATE MEDICAL CARE IF: °You have any of the following symptoms over the next 12 hours or several days: °· Shaking chills. °· Fever with a temperature greater than 102°F (38.9°C) develops. °· Back pain or muscle pain. °· People around you feel you are not acting correctly, or you are confused. °· Blood in the urine or bowel movements, or bleeding from any place in your body. °· Shortness of breath, or difficulty breathing. °· Dizziness. °· Fainting. °· You break out in a rash or develop hives. °· Decrease in the amount of urine you are putting out, or the urine turns a dark color or changes to pink, red, or brown. °· A severe headache or stiff neck. °· Bruising more easily. °Document Released: 02/19/2007 Document Revised: 09/08/2013 Document Reviewed: 02/19/2007 °ExitCare® Patient Information ©2015 [employer]. This information is not intended to replace advice given to you by your health care provider. Make sure you discuss any questions you have with your health care provider. ° °

## 2013-11-28 ENCOUNTER — Other Ambulatory Visit: Payer: Medicare Other

## 2013-11-28 LAB — PREPARE PLATELET PHERESIS: UNIT DIVISION: 0

## 2013-12-01 ENCOUNTER — Telehealth: Payer: Self-pay | Admitting: Medical Oncology

## 2013-12-01 ENCOUNTER — Other Ambulatory Visit: Payer: Medicare Other

## 2013-12-01 ENCOUNTER — Other Ambulatory Visit: Payer: Self-pay | Admitting: Medical Oncology

## 2013-12-01 ENCOUNTER — Other Ambulatory Visit: Payer: Self-pay | Admitting: Oncology

## 2013-12-01 ENCOUNTER — Other Ambulatory Visit (HOSPITAL_BASED_OUTPATIENT_CLINIC_OR_DEPARTMENT_OTHER): Payer: Medicare Other

## 2013-12-01 ENCOUNTER — Ambulatory Visit: Payer: Medicare Other

## 2013-12-01 DIAGNOSIS — D500 Iron deficiency anemia secondary to blood loss (chronic): Secondary | ICD-10-CM

## 2013-12-01 DIAGNOSIS — D649 Anemia, unspecified: Secondary | ICD-10-CM

## 2013-12-01 DIAGNOSIS — Z452 Encounter for adjustment and management of vascular access device: Secondary | ICD-10-CM

## 2013-12-01 LAB — COMPREHENSIVE METABOLIC PANEL (CC13)
ALK PHOS: 60 U/L (ref 40–150)
ALT: 22 U/L (ref 0–55)
ANION GAP: 6 meq/L (ref 3–11)
AST: 15 U/L (ref 5–34)
Albumin: 2.5 g/dL — ABNORMAL LOW (ref 3.5–5.0)
BILIRUBIN TOTAL: 0.45 mg/dL (ref 0.20–1.20)
BUN: 14 mg/dL (ref 7.0–26.0)
CALCIUM: 8.3 mg/dL — AB (ref 8.4–10.4)
CO2: 30 mEq/L — ABNORMAL HIGH (ref 22–29)
Chloride: 105 mEq/L (ref 98–109)
Creatinine: 0.8 mg/dL (ref 0.7–1.3)
GLUCOSE: 118 mg/dL (ref 70–140)
Potassium: 4.6 mEq/L (ref 3.5–5.1)
Sodium: 141 mEq/L (ref 136–145)
Total Protein: 6 g/dL — ABNORMAL LOW (ref 6.4–8.3)

## 2013-12-01 LAB — CBC WITH DIFFERENTIAL/PLATELET
BASO%: 0 % (ref 0.0–2.0)
Basophils Absolute: 0 10*3/uL (ref 0.0–0.1)
EOS%: 0 % (ref 0.0–7.0)
Eosinophils Absolute: 0 10*3/uL (ref 0.0–0.5)
HCT: 29.5 % — ABNORMAL LOW (ref 38.4–49.9)
HGB: 9.2 g/dL — ABNORMAL LOW (ref 13.0–17.1)
LYMPH#: 0.4 10*3/uL — AB (ref 0.9–3.3)
LYMPH%: 65.5 % — ABNORMAL HIGH (ref 14.0–49.0)
MCH: 30 pg (ref 27.2–33.4)
MCHC: 31.2 g/dL — AB (ref 32.0–36.0)
MCV: 96.1 fL (ref 79.3–98.0)
MONO#: 0 10*3/uL — AB (ref 0.1–0.9)
MONO%: 1.7 % (ref 0.0–14.0)
NEUT#: 0.2 10*3/uL — CL (ref 1.5–6.5)
NEUT%: 32.8 % — ABNORMAL LOW (ref 39.0–75.0)
Platelets: 32 10*3/uL — ABNORMAL LOW (ref 140–400)
RBC: 3.07 10*6/uL — AB (ref 4.20–5.82)
RDW: 17.2 % — AB (ref 11.0–14.6)
WBC: 0.6 10*3/uL — AB (ref 4.0–10.3)

## 2013-12-01 LAB — TECHNOLOGIST REVIEW

## 2013-12-01 LAB — MAGNESIUM (CC13): Magnesium: 1.9 mg/dl (ref 1.5–2.5)

## 2013-12-01 MED ORDER — SODIUM CHLORIDE 0.9 % IJ SOLN
10.0000 mL | INTRAMUSCULAR | Status: DC | PRN
  Administered 2013-12-01: 10 mL via INTRAVENOUS
  Filled 2013-12-01: qty 10

## 2013-12-01 MED ORDER — HEPARIN SOD (PORK) LOCK FLUSH 100 UNIT/ML IV SOLN
500.0000 [IU] | Freq: Once | INTRAVENOUS | Status: AC
  Administered 2013-12-01: 250 [IU] via INTRAVENOUS
  Filled 2013-12-01: qty 5

## 2013-12-01 NOTE — Telephone Encounter (Signed)
As ordered, labs faxed and critical values called to [facility] @ [phone].   LVMOM with patient to call office for lab results.

## 2013-12-01 NOTE — Patient Instructions (Signed)
PICC Home Guide A peripherally inserted central catheter (PICC) is a long, thin, flexible tube that is inserted into a vein in the upper arm. It is a form of intravenous (IV) access. It is considered to be a "central" line because the tip of the PICC ends in a large vein in your chest. This large vein is called the superior vena cava (SVC). The PICC tip ends in the SVC because there is a lot of blood flow in the SVC. This allows medicines and IV fluids to be quickly distributed throughout the body. The PICC is inserted using a sterile technique by a specially trained nurse or physician. After the PICC is inserted, a chest X-ray exam is done to be sure it is in the correct place.  A PICC may be placed for different reasons, such as:  To give medicines and liquid nutrition that can only be given through a central line. Examples are:  Certain antibiotic treatments.  Chemotherapy.  Total parenteral nutrition (TPN).  To take frequent blood samples.  To give IV fluids and blood products.  If there is difficulty placing a peripheral intravenous (PIV) catheter. If taken care of properly, a PICC can remain in place for several months. A PICC can also allow a person to go home from the hospital early. Medicine and PICC care can be managed at home by a family member or home health care team. WHAT PROBLEMS CAN HAPPEN WHEN I HAVE A PICC? Problems with a PICC can occasionally occur. These may include the following:  A blood clot (thrombus) forming in or at the tip of the PICC. This can cause the PICC to become clogged. A clot-dissolving medicine called tissue plasminogen activator (tPA) can be given through the PICC to help break up the clot.  Inflammation of the vein (phlebitis) in which the PICC is placed. Signs of inflammation may include redness, pain at the insertion site, red streaks, or being able to feel a "cord" in the vein where the PICC is located.  Infection in the PICC or at the insertion  site. Signs of infection may include fever, chills, redness, swelling, or pus drainage from the PICC insertion site.  PICC movement (malposition). The PICC tip may move from its original position due to excessive physical activity, forceful coughing, sneezing, or vomiting.  A break or cut in the PICC. It is important to not use scissors near the PICC.  Nerve or tendon irritation or injury during PICC insertion. WHAT SHOULD I KEEP IN MIND ABOUT ACTIVITIES WHEN I HAVE A PICC?  You may bend your arm and move it freely. If your PICC is near or at the bend of your elbow, avoid activity with repeated motion at the elbow.  Rest at home for the remainder of the day following PICC line insertion.  Avoid lifting heavy objects as instructed by your health care provider.  Avoid using a crutch with the arm on the same side as your PICC. You may need to use a walker. WHAT SHOULD I KNOW ABOUT MY PICC DRESSING?  Keep your PICC bandage (dressing) clean and dry to prevent infection.  Ask your health care provider when you may shower. Ask your health care provider to teach you how to wrap the PICC when you do take a shower.  Change the PICC dressing as instructed by your health care provider.  Change your PICC dressing if it becomes loose or wet. WHAT SHOULD I KNOW ABOUT PICC CARE?  Check the PICC insertion site   daily for leakage, redness, swelling, or pain.  Do not take a bath, swim, or use hot tubs when you have a PICC. Cover PICC line with clear plastic wrap and tape to keep it dry while showering.  Flush the PICC as directed by your health care provider. Let your health care provider know right away if the PICC is difficult to flush or does not flush. Do not use force to flush the PICC.  Do not use a syringe that is less than 10 mL to flush the PICC.  Never pull or tug on the PICC.  Avoid blood pressure checks on the arm with the PICC.  Keep your PICC identification card with you at all  times.  Do not take the PICC out yourself. Only a trained clinical professional should remove the PICC. SEEK IMMEDIATE MEDICAL CARE IF:  Your PICC is accidentally pulled all the way out. If this happens, cover the insertion site with a bandage or gauze dressing. Do not throw the PICC away. Your health care provider will need to inspect it.  Your PICC was tugged or pulled and has partially come out. Do not  push the PICC back in.  There is any type of drainage, redness, or swelling where the PICC enters the skin.  You cannot flush the PICC, it is difficult to flush, or the PICC leaks around the insertion site when it is flushed.  You hear a "flushing" sound when the PICC is flushed.  You have pain, discomfort, or numbness in your arm, shoulder, or jaw on the same side as the PICC.  You feel your heart "racing" or skipping beats.  You notice a hole or tear in the PICC.  You develop chills or a fever. MAKE SURE YOU:   Understand these instructions.  Will watch your condition.  Will get help right away if you are not doing well or get worse. Document Released: 10/29/2002 Document Revised: 09/08/2013 Document Reviewed: 12/30/2012 ExitCare Patient Information 2015 [employer]. This information is not intended to replace advice given to you by your health care provider. Make sure you discuss any questions you have with your health care provider.  

## 2013-12-02 ENCOUNTER — Other Ambulatory Visit: Payer: Medicare Other

## 2013-12-02 ENCOUNTER — Ambulatory Visit: Payer: Medicare Other | Admitting: Oncology

## 2013-12-05 ENCOUNTER — Other Ambulatory Visit: Payer: Medicare Other

## 2013-12-10 ENCOUNTER — Other Ambulatory Visit: Payer: Self-pay

## 2013-12-10 ENCOUNTER — Telehealth: Payer: Self-pay | Admitting: Oncology

## 2013-12-10 DIAGNOSIS — D509 Iron deficiency anemia, unspecified: Secondary | ICD-10-CM

## 2013-12-10 NOTE — Telephone Encounter (Signed)
Debbie w/[facility] cld to r/s pt's labs/ov, pt had been hospitallized , r/s visits and Jackelyn Poling will be faxing over information from pt's visit and lab work...Marland KitchenMarland KitchenKJ

## 2013-12-18 ENCOUNTER — Other Ambulatory Visit (HOSPITAL_BASED_OUTPATIENT_CLINIC_OR_DEPARTMENT_OTHER): Payer: Medicare Other

## 2013-12-18 ENCOUNTER — Telehealth: Payer: Self-pay | Admitting: Medical Oncology

## 2013-12-18 ENCOUNTER — Ambulatory Visit: Payer: Medicare Other

## 2013-12-18 VITALS — BP 114/67 | HR 79 | Temp 98.2°F | Resp 20

## 2013-12-18 DIAGNOSIS — D509 Iron deficiency anemia, unspecified: Secondary | ICD-10-CM

## 2013-12-18 DIAGNOSIS — Z452 Encounter for adjustment and management of vascular access device: Secondary | ICD-10-CM

## 2013-12-18 DIAGNOSIS — D649 Anemia, unspecified: Secondary | ICD-10-CM

## 2013-12-18 LAB — CBC WITH DIFFERENTIAL/PLATELET
BASO%: 0 % (ref 0.0–2.0)
BASOS ABS: 0 10*3/uL (ref 0.0–0.1)
EOS%: 0 % (ref 0.0–7.0)
Eosinophils Absolute: 0 10*3/uL (ref 0.0–0.5)
HEMATOCRIT: 29.8 % — AB (ref 38.4–49.9)
HEMOGLOBIN: 9.4 g/dL — AB (ref 13.0–17.1)
LYMPH%: 69.4 % — AB (ref 14.0–49.0)
MCH: 29.3 pg (ref 27.2–33.4)
MCHC: 31.5 g/dL — ABNORMAL LOW (ref 32.0–36.0)
MCV: 92.8 fL (ref 79.3–98.0)
MONO#: 0 10*3/uL — ABNORMAL LOW (ref 0.1–0.9)
MONO%: 2 % (ref 0.0–14.0)
NEUT#: 0.1 10*3/uL — CL (ref 1.5–6.5)
NEUT%: 28.6 % — AB (ref 39.0–75.0)
Platelets: 65 10*3/uL — ABNORMAL LOW (ref 140–400)
RBC: 3.21 10*6/uL — ABNORMAL LOW (ref 4.20–5.82)
RDW: 15.8 % — AB (ref 11.0–14.6)
WBC: 0.5 10*3/uL — CL (ref 4.0–10.3)
lymph#: 0.3 10*3/uL — ABNORMAL LOW (ref 0.9–3.3)
nRBC: 0 % (ref 0–0)

## 2013-12-18 MED ORDER — SODIUM CHLORIDE 0.9 % IJ SOLN
10.0000 mL | INTRAMUSCULAR | Status: DC | PRN
  Administered 2013-12-18: 10 mL via INTRAVENOUS
  Filled 2013-12-18: qty 10

## 2013-12-18 NOTE — Patient Instructions (Signed)
PICC Home Guide A peripherally inserted central catheter (PICC) is a long, thin, flexible tube that is inserted into a vein in the upper arm. It is a form of intravenous (IV) access. It is considered to be a "central" line because the tip of the PICC ends in a large vein in your chest. This large vein is called the superior vena cava (SVC). The PICC tip ends in the SVC because there is a lot of blood flow in the SVC. This allows medicines and IV fluids to be quickly distributed throughout the body. The PICC is inserted using a sterile technique by a specially trained nurse or physician. After the PICC is inserted, a chest X-ray exam is done to be sure it is in the correct place.  A PICC may be placed for different reasons, such as:  To give medicines and liquid nutrition that can only be given through a central line. Examples are:  Certain antibiotic treatments.  Chemotherapy.  Total parenteral nutrition (TPN).  To take frequent blood samples.  To give IV fluids and blood products.  If there is difficulty placing a peripheral intravenous (PIV) catheter. If taken care of properly, a PICC can remain in place for several months. A PICC can also allow a person to go home from the hospital early. Medicine and PICC care can be managed at home by a family member or home health care team. WHAT PROBLEMS CAN HAPPEN WHEN I HAVE A PICC? Problems with a PICC can occasionally occur. These may include the following:  A blood clot (thrombus) forming in or at the tip of the PICC. This can cause the PICC to become clogged. A clot-dissolving medicine called tissue plasminogen activator (tPA) can be given through the PICC to help break up the clot.  Inflammation of the vein (phlebitis) in which the PICC is placed. Signs of inflammation may include redness, pain at the insertion site, red streaks, or being able to feel a "cord" in the vein where the PICC is located.  Infection in the PICC or at the insertion  site. Signs of infection may include fever, chills, redness, swelling, or pus drainage from the PICC insertion site.  PICC movement (malposition). The PICC tip may move from its original position due to excessive physical activity, forceful coughing, sneezing, or vomiting.  A break or cut in the PICC. It is important to not use scissors near the PICC.  Nerve or tendon irritation or injury during PICC insertion. WHAT SHOULD I KEEP IN MIND ABOUT ACTIVITIES WHEN I HAVE A PICC?  You may bend your arm and move it freely. If your PICC is near or at the bend of your elbow, avoid activity with repeated motion at the elbow.  Rest at home for the remainder of the day following PICC line insertion.  Avoid lifting heavy objects as instructed by your health care provider.  Avoid using a crutch with the arm on the same side as your PICC. You may need to use a walker. WHAT SHOULD I KNOW ABOUT MY PICC DRESSING?  Keep your PICC bandage (dressing) clean and dry to prevent infection.  Ask your health care provider when you may shower. Ask your health care provider to teach you how to wrap the PICC when you do take a shower.  Change the PICC dressing as instructed by your health care provider.  Change your PICC dressing if it becomes loose or wet. WHAT SHOULD I KNOW ABOUT PICC CARE?  Check the PICC insertion site   daily for leakage, redness, swelling, or pain.  Do not take a bath, swim, or use hot tubs when you have a PICC. Cover PICC line with clear plastic wrap and tape to keep it dry while showering.  Flush the PICC as directed by your health care provider. Let your health care provider know right away if the PICC is difficult to flush or does not flush. Do not use force to flush the PICC.  Do not use a syringe that is less than 10 mL to flush the PICC.  Never pull or tug on the PICC.  Avoid blood pressure checks on the arm with the PICC.  Keep your PICC identification card with you at all  times.  Do not take the PICC out yourself. Only a trained clinical professional should remove the PICC. SEEK IMMEDIATE MEDICAL CARE IF:  Your PICC is accidentally pulled all the way out. If this happens, cover the insertion site with a bandage or gauze dressing. Do not throw the PICC away. Your health care provider will need to inspect it.  Your PICC was tugged or pulled and has partially come out. Do not  push the PICC back in.  There is any type of drainage, redness, or swelling where the PICC enters the skin.  You cannot flush the PICC, it is difficult to flush, or the PICC leaks around the insertion site when it is flushed.  You hear a "flushing" sound when the PICC is flushed.  You have pain, discomfort, or numbness in your arm, shoulder, or jaw on the same side as the PICC.  You feel your heart "racing" or skipping beats.  You notice a hole or tear in the PICC.  You develop chills or a fever. MAKE SURE YOU:   Understand these instructions.  Will watch your condition.  Will get help right away if you are not doing well or get worse. Document Released: 10/29/2002 Document Revised: 09/08/2013 Document Reviewed: 12/30/2012 ExitCare Patient Information 2015 [employer]. This information is not intended to replace advice given to you by your health care provider. Make sure you discuss any questions you have with your health care provider.  

## 2013-12-18 NOTE — Telephone Encounter (Signed)
As ordered, critical lab values phoned to [facility] @ [phone] and faxed to [phone]

## 2013-12-22 ENCOUNTER — Ambulatory Visit: Payer: Medicare Other

## 2013-12-22 ENCOUNTER — Encounter: Payer: Self-pay | Admitting: *Deleted

## 2013-12-22 ENCOUNTER — Other Ambulatory Visit (HOSPITAL_BASED_OUTPATIENT_CLINIC_OR_DEPARTMENT_OTHER): Payer: Medicare Other

## 2013-12-22 DIAGNOSIS — D649 Anemia, unspecified: Secondary | ICD-10-CM

## 2013-12-22 DIAGNOSIS — Z95828 Presence of other vascular implants and grafts: Secondary | ICD-10-CM

## 2013-12-22 DIAGNOSIS — D509 Iron deficiency anemia, unspecified: Secondary | ICD-10-CM

## 2013-12-22 LAB — CBC WITH DIFFERENTIAL/PLATELET
BASO%: 0 % (ref 0.0–2.0)
Basophils Absolute: 0 10*3/uL (ref 0.0–0.1)
EOS%: 4.2 % (ref 0.0–7.0)
Eosinophils Absolute: 0 10*3/uL (ref 0.0–0.5)
HCT: 26.9 % — ABNORMAL LOW (ref 38.4–49.9)
HGB: 8.4 g/dL — ABNORMAL LOW (ref 13.0–17.1)
LYMPH%: 77.1 % — AB (ref 14.0–49.0)
MCH: 29.1 pg (ref 27.2–33.4)
MCHC: 31.2 g/dL — AB (ref 32.0–36.0)
MCV: 93.1 fL (ref 79.3–98.0)
MONO#: 0 10*3/uL — ABNORMAL LOW (ref 0.1–0.9)
MONO%: 0 % (ref 0.0–14.0)
NEUT#: 0.1 10*3/uL — CL (ref 1.5–6.5)
NEUT%: 18.7 % — ABNORMAL LOW (ref 39.0–75.0)
NRBC: 0 % (ref 0–0)
PLATELETS: 27 10*3/uL — AB (ref 140–400)
RBC: 2.89 10*6/uL — AB (ref 4.20–5.82)
RDW: 15.5 % — ABNORMAL HIGH (ref 11.0–14.6)
WBC: 0.5 10*3/uL — CL (ref 4.0–10.3)
lymph#: 0.4 10*3/uL — ABNORMAL LOW (ref 0.9–3.3)

## 2013-12-22 LAB — COMPREHENSIVE METABOLIC PANEL (CC13)
ALT: 15 U/L (ref 0–55)
AST: 12 U/L (ref 5–34)
Albumin: 2.6 g/dL — ABNORMAL LOW (ref 3.5–5.0)
Alkaline Phosphatase: 60 U/L (ref 40–150)
Anion Gap: 7 mEq/L (ref 3–11)
BUN: 14.2 mg/dL (ref 7.0–26.0)
CO2: 28 mEq/L (ref 22–29)
CREATININE: 0.8 mg/dL (ref 0.7–1.3)
Calcium: 8.4 mg/dL (ref 8.4–10.4)
Chloride: 105 mEq/L (ref 98–109)
Glucose: 110 mg/dl (ref 70–140)
Potassium: 3.8 mEq/L (ref 3.5–5.1)
Sodium: 140 mEq/L (ref 136–145)
Total Bilirubin: 0.69 mg/dL (ref 0.20–1.20)
Total Protein: 5.9 g/dL — ABNORMAL LOW (ref 6.4–8.3)

## 2013-12-22 MED ORDER — SODIUM CHLORIDE 0.9 % IJ SOLN
10.0000 mL | INTRAMUSCULAR | Status: DC | PRN
  Administered 2013-12-22: 10 mL via INTRAVENOUS
  Filled 2013-12-22: qty 10

## 2013-12-22 MED ORDER — HEPARIN SOD (PORK) LOCK FLUSH 100 UNIT/ML IV SOLN
500.0000 [IU] | Freq: Once | INTRAVENOUS | Status: AC
  Administered 2013-12-22: 250 [IU] via INTRAVENOUS
  Filled 2013-12-22: qty 5

## 2013-12-22 NOTE — Progress Notes (Signed)
PT.'S CBC RESULTS SHOWN TO DR.SHADAD. VERBAL ORDER AND READ BACK TO DR.SHADAD- NO ACTION NEEDED.

## 2013-12-22 NOTE — Patient Instructions (Signed)
PICC Home Guide A peripherally inserted central catheter (PICC) is a long, thin, flexible tube that is inserted into a vein in the upper arm. It is a form of intravenous (IV) access. It is considered to be a "central" line because the tip of the PICC ends in a large vein in your chest. This large vein is called the superior vena cava (SVC). The PICC tip ends in the SVC because there is a lot of blood flow in the SVC. This allows medicines and IV fluids to be quickly distributed throughout the body. The PICC is inserted using a sterile technique by a specially trained nurse or physician. After the PICC is inserted, a chest X-ray exam is done to be sure it is in the correct place.  A PICC may be placed for different reasons, such as:  To give medicines and liquid nutrition that can only be given through a central line. Examples are:  Certain antibiotic treatments.  Chemotherapy.  Total parenteral nutrition (TPN).  To take frequent blood samples.  To give IV fluids and blood products.  If there is difficulty placing a peripheral intravenous (PIV) catheter. If taken care of properly, a PICC can remain in place for several months. A PICC can also allow a person to go home from the hospital early. Medicine and PICC care can be managed at home by a family member or home health care team. WHAT PROBLEMS CAN HAPPEN WHEN I HAVE A PICC? Problems with a PICC can occasionally occur. These may include the following:  A blood clot (thrombus) forming in or at the tip of the PICC. This can cause the PICC to become clogged. A clot-dissolving medicine called tissue plasminogen activator (tPA) can be given through the PICC to help break up the clot.  Inflammation of the vein (phlebitis) in which the PICC is placed. Signs of inflammation may include redness, pain at the insertion site, red streaks, or being able to feel a "cord" in the vein where the PICC is located.  Infection in the PICC or at the insertion  site. Signs of infection may include fever, chills, redness, swelling, or pus drainage from the PICC insertion site.  PICC movement (malposition). The PICC tip may move from its original position due to excessive physical activity, forceful coughing, sneezing, or vomiting.  A break or cut in the PICC. It is important to not use scissors near the PICC.  Nerve or tendon irritation or injury during PICC insertion. WHAT SHOULD I KEEP IN MIND ABOUT ACTIVITIES WHEN I HAVE A PICC?  You may bend your arm and move it freely. If your PICC is near or at the bend of your elbow, avoid activity with repeated motion at the elbow.  Rest at home for the remainder of the day following PICC line insertion.  Avoid lifting heavy objects as instructed by your health care provider.  Avoid using a crutch with the arm on the same side as your PICC. You may need to use a walker. WHAT SHOULD I KNOW ABOUT MY PICC DRESSING?  Keep your PICC bandage (dressing) clean and dry to prevent infection.  Ask your health care provider when you may shower. Ask your health care provider to teach you how to wrap the PICC when you do take a shower.  Change the PICC dressing as instructed by your health care provider.  Change your PICC dressing if it becomes loose or wet. WHAT SHOULD I KNOW ABOUT PICC CARE?  Check the PICC insertion site   daily for leakage, redness, swelling, or pain.  Do not take a bath, swim, or use hot tubs when you have a PICC. Cover PICC line with clear plastic wrap and tape to keep it dry while showering.  Flush the PICC as directed by your health care provider. Let your health care provider know right away if the PICC is difficult to flush or does not flush. Do not use force to flush the PICC.  Do not use a syringe that is less than 10 mL to flush the PICC.  Never pull or tug on the PICC.  Avoid blood pressure checks on the arm with the PICC.  Keep your PICC identification card with you at all  times.  Do not take the PICC out yourself. Only a trained clinical professional should remove the PICC. SEEK IMMEDIATE MEDICAL CARE IF:  Your PICC is accidentally pulled all the way out. If this happens, cover the insertion site with a bandage or gauze dressing. Do not throw the PICC away. Your health care provider will need to inspect it.  Your PICC was tugged or pulled and has partially come out. Do not  push the PICC back in.  There is any type of drainage, redness, or swelling where the PICC enters the skin.  You cannot flush the PICC, it is difficult to flush, or the PICC leaks around the insertion site when it is flushed.  You hear a "flushing" sound when the PICC is flushed.  You have pain, discomfort, or numbness in your arm, shoulder, or jaw on the same side as the PICC.  You feel your heart "racing" or skipping beats.  You notice a hole or tear in the PICC.  You develop chills or a fever. MAKE SURE YOU:   Understand these instructions.  Will watch your condition.  Will get help right away if you are not doing well or get worse. Document Released: 10/29/2002 Document Revised: 09/08/2013 Document Reviewed: 12/30/2012 ExitCare Patient Information 2015 [employer]. This information is not intended to replace advice given to you by your health care provider. Make sure you discuss any questions you have with your health care provider.  

## 2013-12-25 ENCOUNTER — Ambulatory Visit: Payer: Medicare Other

## 2013-12-25 ENCOUNTER — Other Ambulatory Visit: Payer: Medicare Other

## 2013-12-25 ENCOUNTER — Encounter: Payer: Self-pay | Admitting: Oncology

## 2013-12-25 ENCOUNTER — Ambulatory Visit (HOSPITAL_BASED_OUTPATIENT_CLINIC_OR_DEPARTMENT_OTHER): Payer: Medicare Other | Admitting: Oncology

## 2013-12-25 ENCOUNTER — Ambulatory Visit (HOSPITAL_BASED_OUTPATIENT_CLINIC_OR_DEPARTMENT_OTHER): Payer: Medicare Other

## 2013-12-25 ENCOUNTER — Other Ambulatory Visit: Payer: Self-pay | Admitting: Medical Oncology

## 2013-12-25 ENCOUNTER — Other Ambulatory Visit (HOSPITAL_BASED_OUTPATIENT_CLINIC_OR_DEPARTMENT_OTHER): Payer: Medicare Other

## 2013-12-25 ENCOUNTER — Other Ambulatory Visit: Payer: Self-pay | Admitting: Oncology

## 2013-12-25 ENCOUNTER — Telehealth: Payer: Self-pay | Admitting: *Deleted

## 2013-12-25 ENCOUNTER — Telehealth: Payer: Self-pay | Admitting: Medical Oncology

## 2013-12-25 VITALS — BP 146/79 | HR 102 | Temp 98.4°F | Resp 18 | Ht 69.5 in | Wt 178.3 lb

## 2013-12-25 DIAGNOSIS — C9200 Acute myeloblastic leukemia, not having achieved remission: Secondary | ICD-10-CM

## 2013-12-25 DIAGNOSIS — D509 Iron deficiency anemia, unspecified: Secondary | ICD-10-CM

## 2013-12-25 DIAGNOSIS — D709 Neutropenia, unspecified: Secondary | ICD-10-CM

## 2013-12-25 DIAGNOSIS — D696 Thrombocytopenia, unspecified: Secondary | ICD-10-CM

## 2013-12-25 DIAGNOSIS — Z452 Encounter for adjustment and management of vascular access device: Secondary | ICD-10-CM

## 2013-12-25 LAB — CBC WITH DIFFERENTIAL/PLATELET
BASO%: 0 % (ref 0.0–2.0)
Basophils Absolute: 0 10*3/uL (ref 0.0–0.1)
EOS%: 3.3 % (ref 0.0–7.0)
Eosinophils Absolute: 0 10*3/uL (ref 0.0–0.5)
HCT: 25.3 % — ABNORMAL LOW (ref 38.4–49.9)
HGB: 7.9 g/dL — ABNORMAL LOW (ref 13.0–17.1)
LYMPH#: 0.5 10*3/uL — AB (ref 0.9–3.3)
LYMPH%: 73.8 % — ABNORMAL HIGH (ref 14.0–49.0)
MCH: 28.9 pg (ref 27.2–33.4)
MCHC: 31.2 g/dL — AB (ref 32.0–36.0)
MCV: 92.7 fL (ref 79.3–98.0)
MONO#: 0 10*3/uL — AB (ref 0.1–0.9)
MONO%: 0 % (ref 0.0–14.0)
NEUT#: 0.1 10*3/uL — CL (ref 1.5–6.5)
NEUT%: 22.9 % — ABNORMAL LOW (ref 39.0–75.0)
Platelets: 15 10*3/uL — ABNORMAL LOW (ref 140–400)
RBC: 2.73 10*6/uL — AB (ref 4.20–5.82)
RDW: 15.8 % — ABNORMAL HIGH (ref 11.0–14.6)
WBC: 0.6 10*3/uL — AB (ref 4.0–10.3)

## 2013-12-25 LAB — HOLD TUBE, BLOOD BANK

## 2013-12-25 MED ORDER — HEPARIN SOD (PORK) LOCK FLUSH 100 UNIT/ML IV SOLN
500.0000 [IU] | Freq: Once | INTRAVENOUS | Status: AC
  Administered 2013-12-25: 250 [IU] via INTRAVENOUS
  Filled 2013-12-25: qty 5

## 2013-12-25 MED ORDER — SODIUM CHLORIDE 0.9 % IJ SOLN
10.0000 mL | INTRAMUSCULAR | Status: DC | PRN
  Administered 2013-12-25: 10 mL via INTRAVENOUS
  Filled 2013-12-25: qty 10

## 2013-12-25 MED ORDER — HEPARIN SOD (PORK) LOCK FLUSH 100 UNIT/ML IV SOLN
500.0000 [IU] | Freq: Once | INTRAVENOUS | Status: AC
  Administered 2013-12-25: 500 [IU] via INTRAVENOUS
  Filled 2013-12-25: qty 5

## 2013-12-25 MED ORDER — SODIUM CHLORIDE 0.9 % IJ SOLN
10.0000 mL | INTRAMUSCULAR | Status: DC | PRN
  Filled 2013-12-25: qty 10

## 2013-12-25 NOTE — Patient Instructions (Signed)
PICC Home Guide A peripherally inserted central catheter (PICC) is a long, thin, flexible tube that is inserted into a vein in the upper arm. It is a form of intravenous (IV) access. It is considered to be a "central" line because the tip of the PICC ends in a large vein in your chest. This large vein is called the superior vena cava (SVC). The PICC tip ends in the SVC because there is a lot of blood flow in the SVC. This allows medicines and IV fluids to be quickly distributed throughout the body. The PICC is inserted using a sterile technique by a specially trained nurse or physician. After the PICC is inserted, a chest X-ray exam is done to be sure it is in the correct place.  A PICC may be placed for different reasons, such as:  To give medicines and liquid nutrition that can only be given through a central line. Examples are:  Certain antibiotic treatments.  Chemotherapy.  Total parenteral nutrition (TPN).  To take frequent blood samples.  To give IV fluids and blood products.  If there is difficulty placing a peripheral intravenous (PIV) catheter. If taken care of properly, a PICC can remain in place for several months. A PICC can also allow a person to go home from the hospital early. Medicine and PICC care can be managed at home by a family member or home health care team. WHAT PROBLEMS CAN HAPPEN WHEN I HAVE A PICC? Problems with a PICC can occasionally occur. These may include the following:  A blood clot (thrombus) forming in or at the tip of the PICC. This can cause the PICC to become clogged. A clot-dissolving medicine called tissue plasminogen activator (tPA) can be given through the PICC to help break up the clot.  Inflammation of the vein (phlebitis) in which the PICC is placed. Signs of inflammation may include redness, pain at the insertion site, red streaks, or being able to feel a "cord" in the vein where the PICC is located.  Infection in the PICC or at the insertion  site. Signs of infection may include fever, chills, redness, swelling, or pus drainage from the PICC insertion site.  PICC movement (malposition). The PICC tip may move from its original position due to excessive physical activity, forceful coughing, sneezing, or vomiting.  A break or cut in the PICC. It is important to not use scissors near the PICC.  Nerve or tendon irritation or injury during PICC insertion. WHAT SHOULD I KEEP IN MIND ABOUT ACTIVITIES WHEN I HAVE A PICC?  You may bend your arm and move it freely. If your PICC is near or at the bend of your elbow, avoid activity with repeated motion at the elbow.  Rest at home for the remainder of the day following PICC line insertion.  Avoid lifting heavy objects as instructed by your health care provider.  Avoid using a crutch with the arm on the same side as your PICC. You may need to use a walker. WHAT SHOULD I KNOW ABOUT MY PICC DRESSING?  Keep your PICC bandage (dressing) clean and dry to prevent infection.  Ask your health care provider when you may shower. Ask your health care provider to teach you how to wrap the PICC when you do take a shower.  Change the PICC dressing as instructed by your health care provider.  Change your PICC dressing if it becomes loose or wet. WHAT SHOULD I KNOW ABOUT PICC CARE?  Check the PICC insertion site   daily for leakage, redness, swelling, or pain.  Do not take a bath, swim, or use hot tubs when you have a PICC. Cover PICC line with clear plastic wrap and tape to keep it dry while showering.  Flush the PICC as directed by your health care provider. Let your health care provider know right away if the PICC is difficult to flush or does not flush. Do not use force to flush the PICC.  Do not use a syringe that is less than 10 mL to flush the PICC.  Never pull or tug on the PICC.  Avoid blood pressure checks on the arm with the PICC.  Keep your PICC identification card with you at all  times.  Do not take the PICC out yourself. Only a trained clinical professional should remove the PICC. SEEK IMMEDIATE MEDICAL CARE IF:  Your PICC is accidentally pulled all the way out. If this happens, cover the insertion site with a bandage or gauze dressing. Do not throw the PICC away. Your health care provider will need to inspect it.  Your PICC was tugged or pulled and has partially come out. Do not  push the PICC back in.  There is any type of drainage, redness, or swelling where the PICC enters the skin.  You cannot flush the PICC, it is difficult to flush, or the PICC leaks around the insertion site when it is flushed.  You hear a "flushing" sound when the PICC is flushed.  You have pain, discomfort, or numbness in your arm, shoulder, or jaw on the same side as the PICC.  You feel your heart "racing" or skipping beats.  You notice a hole or tear in the PICC.  You develop chills or a fever. MAKE SURE YOU:   Understand these instructions.  Will watch your condition.  Will get help right away if you are not doing well or get worse. Document Released: 10/29/2002 Document Revised: 09/08/2013 Document Reviewed: 12/30/2012 ExitCare Patient Information 2015 [employer]. This information is not intended to replace advice given to you by your health care provider. Make sure you discuss any questions you have with your health care provider.  

## 2013-12-25 NOTE — Telephone Encounter (Signed)
Per staff message and POF I have scheduled appts. Advised scheduler of appts. JMW  

## 2013-12-25 NOTE — Progress Notes (Signed)
Hematology and Oncology Follow Up Visit  [name] [id number] Jul 03, 1940 73 y.o. 12/25/2013 9:47 AM [name], [name], Eric [place], MD   Principle Diagnosis: 73 year old gentleman with neutropenia and mild anemia  with history of inflammatory bowel disease. 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   Prior Therapy: He is S/P IV iron in the form of Feraheme for a total of 1 g given in March of 2015.  Current therapy: He is receiving induction decitabine at 20 mg per meter square for 10 days done at [facility]. He is also receiving intermittent blood products for supportive care.  Interim History:  [name] presents today for a followup visit. He is a pleasant gentleman Crohn's disease. 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 He received decitabine on July 2 and a repeat treatment in August. He tolerated therapy well but required paclitaxel transfusions and platelets on intermittent basis. He did not report any nausea or vomiting. His abdominal pain has improved. His appetite has stabilized and his decline and weight have also stabilized. Did not report any fevers or chills or sweats. Did not report any genitourinary complaints. That report any neurological symptoms. Is not reporting any chest pain or palpitations. Rest of his review of system is unremarkable.  Medications: I have reviewed the patient's current medications.  Current Outpatient Prescriptions  Medication Sig Dispense Refill  . finasteride (PROSCAR) 5 MG tablet Take 5 mg by mouth at bedtime.       . fluconazole (DIFLUCAN) 200 MG tablet       . levofloxacin (LEVAQUIN) 500 MG tablet Take 500 mg by mouth daily.       . metroNIDAZOLE (FLAGYL) 500 MG tablet Take 1 tablet (500 mg total) by mouth every 8 (eight) hours.  30 tablet   0  . omeprazole (PRILOSEC) 20 MG capsule Take 20 mg by mouth daily before breakfast.       . ondansetron (ZOFRAN) 4 MG tablet Take 1 tablet (4 mg total) by mouth every 8 (eight) hours as needed for nausea or vomiting.  65 tablet  1  . oxyCODONE-acetaminophen (PERCOCET/ROXICET) 5-325 MG per tablet Take 1-2 tablets by mouth every 3 (three) hours as needed for moderate pain.  65 tablet  0  . potassium chloride (MICRO-K) 10 MEQ CR capsule Take 10 mEq by mouth daily.       . predniSONE (DELTASONE) 20 MG tablet Take 1 tablet (20 mg total) by mouth daily with breakfast.  30 tablet  0  . prochlorperazine (COMPAZINE) 5 MG tablet Take 5 mg by mouth every 6 (six) hours as needed for nausea or vomiting.      . fluticasone (FLONASE) 50 MCG/ACT nasal spray Place 1 spray into both nostrils daily.  16 g  2  . sennosides-docusate sodium (SENOKOT-S) 8.6-50 MG tablet Take 1 tablet by mouth daily. 1-2 tablets 2 times daily by mouth.       No current facility-administered medications for this visit.        Physical Exam: Blood pressure 146/79, pulse 102, temperature 98.4 F (36.9 C), temperature source Oral, resp. rate 18, height 5' 9.5" (1.765 m), weight 178 lb 4.8 oz (80.876 kg), SpO2 100.00%. ECOG: 1 General appearance: alert and cooperative Head: Normocephalic, without obvious abnormality, atraumatic Neck: no adenopathy Lymph nodes: Cervical, supraclavicular,  and axillary nodes normal. Heart:regular rate and rhythm, S1, S2 normal, no murmur, click, rub or gallop Lung:chest clear, no wheezing, rales, normal symmetric air entry Abdomin: soft, non-tender, without masses or organomegaly EXT:no erythema, induration, or nodules   CBC    Component Value Date/Time   WBC 0.6* 12/25/2013 0854   WBC 1.1* 10/06/2013 0422   RBC 2.73* 12/25/2013 0854   RBC 3.16* 10/06/2013 0422   HGB 7.9* 12/25/2013 0854   HGB 8.6* 10/06/2013 0422   HCT 25.3* 12/25/2013 0854   HCT 29.3* 10/06/2013 0422   PLT 15* 12/25/2013 0854    PLT 164 10/06/2013 0422   MCV 92.7 12/25/2013 0854   MCV 92.7 10/06/2013 0422   MCH 28.9 12/25/2013 0854   MCH 27.2 10/06/2013 0422   MCHC 31.2* 12/25/2013 0854   MCHC 29.4* 10/06/2013 0422   RDW 15.8* 12/25/2013 0854   RDW 18.4* 10/06/2013 0422   LYMPHSABS 0.5* 12/25/2013 0854   LYMPHSABS 0.6* 10/02/2013 0340   MONOABS 0.0* 12/25/2013 0854   MONOABS 0.3 10/02/2013 0340   EOSABS 0.0 12/25/2013 0854   EOSABS 0.0 10/02/2013 0340   BASOSABS 0.0 12/25/2013 0854   BASOSABS 0.0 10/02/2013 0340     Impression and Plan:  73-year-old gentleman with the following issues:  1. Iron deficiency anemia: He is receiving recurrent transfusions for his leukemia and less of an issue at this time.  2. AML: He is currently receiving Decitabine under the care [facility]. It is unclear to me whether this has helped his quality of life or not probably will require restaging workup. Further management of his leukemia is per Dr. Powell.   3. blood products support: He has anemia and thrombocytopenia. Although he does have any bleeding but definitely has symptoms of fatigue and tiredness. We'll arrange for him to have packed red cell transfusion as well as platelet transfusion in the next 24 hours.  4. Infectious disease: He is neutropenic but does not have any fevers or signs of sepsis.  5. IV access: He has a PICC line in place and continue to be flushed routinely.  Followup: Will be as needed from our standpoint in his care is predominantly directed by [facility] we here to assist in his blood products transfusion for the patient's convenience.   ,, MD 8/20/20159:47 AM 

## 2013-12-25 NOTE — Telephone Encounter (Signed)
Per [place] orders, call to Hematology dept @ [phone] regarding patient's labs and to inform them that pt is scheduled to have 2 units of PRBC's as well as platelets tomorrow 12/26/2013. Lab results also faxed to their office @ [phone].

## 2013-12-26 ENCOUNTER — Ambulatory Visit (HOSPITAL_BASED_OUTPATIENT_CLINIC_OR_DEPARTMENT_OTHER): Payer: Medicare Other

## 2013-12-26 ENCOUNTER — Other Ambulatory Visit: Payer: Self-pay | Admitting: Medical Oncology

## 2013-12-26 VITALS — BP 141/72 | HR 65 | Temp 98.1°F | Resp 18

## 2013-12-26 DIAGNOSIS — D649 Anemia, unspecified: Secondary | ICD-10-CM

## 2013-12-26 DIAGNOSIS — C9200 Acute myeloblastic leukemia, not having achieved remission: Secondary | ICD-10-CM

## 2013-12-26 MED ORDER — DIPHENHYDRAMINE HCL 25 MG PO CAPS
ORAL_CAPSULE | ORAL | Status: AC
  Filled 2013-12-26: qty 1

## 2013-12-26 MED ORDER — SODIUM CHLORIDE 0.9 % IV SOLN
250.0000 mL | Freq: Once | INTRAVENOUS | Status: AC
  Administered 2013-12-26: 250 mL via INTRAVENOUS

## 2013-12-26 MED ORDER — DIPHENHYDRAMINE HCL 25 MG PO CAPS
25.0000 mg | ORAL_CAPSULE | Freq: Once | ORAL | Status: AC
  Administered 2013-12-26: 25 mg via ORAL

## 2013-12-26 MED ORDER — ACETAMINOPHEN 325 MG PO TABS
ORAL_TABLET | ORAL | Status: AC
  Filled 2013-12-26: qty 2

## 2013-12-26 MED ORDER — SODIUM CHLORIDE 0.9 % IJ SOLN
10.0000 mL | INTRAMUSCULAR | Status: AC | PRN
  Administered 2013-12-26: 10 mL
  Filled 2013-12-26: qty 10

## 2013-12-26 MED ORDER — ACETAMINOPHEN 325 MG PO TABS
650.0000 mg | ORAL_TABLET | Freq: Once | ORAL | Status: AC
  Administered 2013-12-26: 650 mg via ORAL

## 2013-12-26 MED ORDER — HEPARIN SOD (PORK) LOCK FLUSH 100 UNIT/ML IV SOLN
250.0000 [IU] | INTRAVENOUS | Status: AC | PRN
  Administered 2013-12-26: 15:00:00
  Filled 2013-12-26: qty 5

## 2013-12-26 MED ORDER — SODIUM CHLORIDE 0.9 % IJ SOLN
3.0000 mL | INTRAMUSCULAR | Status: DC | PRN
  Filled 2013-12-26: qty 10

## 2013-12-26 NOTE — Progress Notes (Signed)
1445-At time of discharge flush to PICC, large red hive noted to right cheek and to right eye lid.  Large red hive also noted to the top of pt.'s head.  No further hives noted.  Pt states itching to hive on head only.  Dr. Alen Blew notified and order received to give patient Benadryl 25 mg PO now and discharge pt to home.  1530-Hive to right cheek, right eye lid and top of head now pink in color.  Pt denies itching at this time.  Pt discharged to home with assist of wife.  Pt and pt.'s wife have no questions at this time.

## 2013-12-26 NOTE — Progress Notes (Signed)
Call Blood bank.  Blood ready but need order for platelets.  Released signed and held orders and called collaborative nurse.

## 2013-12-26 NOTE — Patient Instructions (Signed)
Platelet Transfusion Information This is information about transfusions of platelets. Platelets are tiny cells made by the bone marrow and found in the blood. When a blood vessel is damaged, platelets rush to the damaged area to help form a clot. This begins the healing process. When platelets get very low, your blood may have trouble clotting. This may be from:  Illness.  Blood disorder.  Chemotherapy to treat cancer. Often, lower platelet counts do not cause problems.  Platelets usually last for 7 to 10 days. If they are not used in an injury, they are broken down by the liver or spleen. Symptoms of low platelet count include:  Nosebleeds.  Bleeding gums.  Heavy periods.  Bruising and tiny blood spots in the skin.  Pinpoint spots of bleeding (petechiae).  Larger bruises (purpura).  Bleeding can be more serious if it happens in the brain or bowel. Platelet transfusions are often used to keep the platelet count at an acceptable level. Serious bleeding due to low platelets is uncommon. RISKS AND COMPLICATIONS Severe side effects from platelet transfusions are uncommon. Minor reactions may include:  Itching.  Rashes.  High temperature and shivering. Medications are available to stop transfusion reactions. Let your health care provider know if you develop any of the above problems.  If you are having platelet transfusions frequently, they may get less effective. This is called becoming refractory to platelets. It is uncommon. This can happen from non-immune causes and immune causes. Non-immune causes include:  High temperatures.  Some medications.  An enlarged spleen. Immune causes happen when your body discovers the platelets are not your own and begins making antibodies against them. The antibodies kill the platelets quickly. Even with platelet transfusions, you may still notice problems with bleeding or bruising. Let your health care providers know about this. Other things  can be done to help if this happens.  BEFORE THE PROCEDURE   Your health care provider will check your platelet count regularly.  If the platelet count is too low, it may be necessary to have a platelet transfusion.  This is more important before certain procedures with a risk of bleeding, such as a spinal tap.  Platelet transfusion reduces the risk of bleeding during or after the procedure.  Except in emergencies, giving a transfusion requires a written consent. Before blood is taken from a donor, a complete history is taken to make sure the person has no history of previous diseases, nor engages in risky social behavior. Examples of this are intravenous drug use or sexual activity with multiple partners. This could lead to infected blood or blood products being used. This history is taken in spite of the extensive testing to make sure the blood is safe. All blood products transfused are tested to make sure it is a match for the person getting the blood. It is also checked for infections. Blood is the safest it has ever been. The risk of getting an infection is very low. PROCEDURE  The platelets are stored in small plastic bags that are kept at a low temperature.  Each bag is called a unit and sometimes two units are given. They are given through an intravenous line by drip infusion over about one-half hour.  Usually blood is collected from multiple people to get enough to transfuse.  Sometimes, the platelets are collected from a single person. This is done using a special machine that separates the platelets from the blood. The machine is called an apheresis machine. Platelets collected in this   way are called apheresed platelets. Apheresed platelets reduce the risk of becoming sensitive to the platelets. This lowers the chances of having a transfusion reaction.  As it only takes a short time to give the platelets, this treatment can be given in an outpatient department. Platelets can also be  given before or after other treatments. SEEK IMMEDIATE MEDICAL CARE IF: You have any of the following symptoms over the next 12 hours or several days:  Shaking chills.  Fever with a temperature greater than 102F (38.9C) develops.  Back pain or muscle pain.  People around you feel you are not acting correctly, or you are confused.  Blood in the urine or bowel movements, or bleeding from any place in your body.  Shortness of breath, or difficulty breathing.  Dizziness.  Fainting.  You break out in a rash or develop hives.  Decrease in the amount of urine you are putting out, or the urine turns a dark color or changes to pink, red, or brown.  A severe headache or stiff neck.  Bruising more easily. Document Released: 02/19/2007 Document Revised: 09/08/2013 Document Reviewed: 02/19/2007 ExitCare Patient Information 2015 [employer]. This information is not intended to replace advice given to you by your health care provider. Make sure you discuss any questions you have with your health care provider.  Blood Transfusion  A blood transfusion replaces your blood or some of its parts. Blood is replaced when you have lost blood because of surgery, an accident, or for severe blood conditions like anemia. You can donate blood to be used on yourself if you have a planned surgery. If you lose blood during that surgery, your own blood can be given back to you. Any blood given to you is checked to make sure it matches your blood type. Your temperature, blood pressure, and heart rate (vital signs) will be checked often.  GET HELP RIGHT AWAY IF:   You feel sick to your stomach (nauseous) or throw up (vomit).  You have watery poop (diarrhea).  You have shortness of breath or trouble breathing.  You have blood in your pee (urine) or have dark colored pee.  You have chest pain or tightness.  Your eyes or skin turn yellow (jaundice).  You have a temperature by mouth above 102 F  (38.9 C), not controlled by medicine.  You start to shake and have chills.  You develop a a red rash (hives) or feel itchy.  You develop lightheadedness or feel confused.  You develop back, joint, or muscle pain.  You do not feel hungry (lost appetite).  You feel tired, restless, or nervous.  You develop belly (abdominal) cramps. Document Released: 07/21/2008 Document Revised: 07/17/2011 Document Reviewed: 07/21/2008 ExitCare Patient Information 2015 [employer]. This information is not intended to replace advice given to you by your health care provider. Make sure you discuss any questions you have with your health care provider.  

## 2013-12-26 NOTE — Progress Notes (Signed)
Tolerating 1st unit of blood with no problems.  Rate increased to 185 ml/hr.

## 2013-12-26 NOTE — Progress Notes (Signed)
VSS.  Nurse Tech to lab to obtain first unit blood.

## 2013-12-27 LAB — PREPARE PLATELET PHERESIS: Unit division: 0

## 2013-12-27 LAB — TYPE AND SCREEN
ABO/RH(D): O POS
Antibody Screen: NEGATIVE
UNIT DIVISION: 0
Unit division: 0

## 2013-12-29 ENCOUNTER — Ambulatory Visit (HOSPITAL_BASED_OUTPATIENT_CLINIC_OR_DEPARTMENT_OTHER): Payer: Medicare Other

## 2013-12-29 ENCOUNTER — Other Ambulatory Visit (HOSPITAL_BASED_OUTPATIENT_CLINIC_OR_DEPARTMENT_OTHER): Payer: Medicare Other

## 2013-12-29 VITALS — BP 120/74 | HR 78 | Temp 97.6°F

## 2013-12-29 DIAGNOSIS — Z452 Encounter for adjustment and management of vascular access device: Secondary | ICD-10-CM

## 2013-12-29 DIAGNOSIS — D509 Iron deficiency anemia, unspecified: Secondary | ICD-10-CM

## 2013-12-29 DIAGNOSIS — D696 Thrombocytopenia, unspecified: Secondary | ICD-10-CM

## 2013-12-29 DIAGNOSIS — C9200 Acute myeloblastic leukemia, not having achieved remission: Secondary | ICD-10-CM

## 2013-12-29 LAB — COMPREHENSIVE METABOLIC PANEL (CC13)
ALT: <6 U/L (ref 0–55)
AST: 9 U/L (ref 5–34)
Albumin: 2.5 g/dL — ABNORMAL LOW (ref 3.5–5.0)
Alkaline Phosphatase: 53 U/L (ref 40–150)
Anion Gap: 7 mEq/L (ref 3–11)
BILIRUBIN TOTAL: 0.62 mg/dL (ref 0.20–1.20)
BUN: 9.7 mg/dL (ref 7.0–26.0)
CO2: 26 mEq/L (ref 22–29)
CREATININE: 0.8 mg/dL (ref 0.7–1.3)
Calcium: 8.2 mg/dL — ABNORMAL LOW (ref 8.4–10.4)
Chloride: 107 mEq/L (ref 98–109)
GLUCOSE: 96 mg/dL (ref 70–140)
Potassium: 3.7 mEq/L (ref 3.5–5.1)
Sodium: 141 mEq/L (ref 136–145)
Total Protein: 5.8 g/dL — ABNORMAL LOW (ref 6.4–8.3)

## 2013-12-29 LAB — CBC WITH DIFFERENTIAL/PLATELET
BASO%: 0 % (ref 0.0–2.0)
BASOS ABS: 0 10*3/uL (ref 0.0–0.1)
EOS%: 2.3 % (ref 0.0–7.0)
Eosinophils Absolute: 0 10*3/uL (ref 0.0–0.5)
HEMATOCRIT: 28 % — AB (ref 38.4–49.9)
HGB: 8.9 g/dL — ABNORMAL LOW (ref 13.0–17.1)
LYMPH%: 70.5 % — AB (ref 14.0–49.0)
MCH: 29.6 pg (ref 27.2–33.4)
MCHC: 31.8 g/dL — ABNORMAL LOW (ref 32.0–36.0)
MCV: 93 fL (ref 79.3–98.0)
MONO#: 0 10*3/uL — ABNORMAL LOW (ref 0.1–0.9)
MONO%: 2.3 % (ref 0.0–14.0)
NEUT#: 0.1 10*3/uL — CL (ref 1.5–6.5)
NEUT%: 24.9 % — AB (ref 39.0–75.0)
Platelets: 14 10*3/uL — ABNORMAL LOW (ref 140–400)
RBC: 3.01 10*6/uL — ABNORMAL LOW (ref 4.20–5.82)
RDW: 16.7 % — ABNORMAL HIGH (ref 11.0–14.6)
WBC: 0.4 10*3/uL — CL (ref 4.0–10.3)
lymph#: 0.3 10*3/uL — ABNORMAL LOW (ref 0.9–3.3)
nRBC: 0 % (ref 0–0)

## 2013-12-29 LAB — MAGNESIUM (CC13): MAGNESIUM: 1.9 mg/dL (ref 1.5–2.5)

## 2013-12-29 MED ORDER — HEPARIN SOD (PORK) LOCK FLUSH 100 UNIT/ML IV SOLN
500.0000 [IU] | Freq: Once | INTRAVENOUS | Status: AC
  Administered 2013-12-29: 500 [IU] via INTRAVENOUS
  Filled 2013-12-29: qty 5

## 2013-12-29 MED ORDER — SODIUM CHLORIDE 0.9 % IJ SOLN
10.0000 mL | INTRAMUSCULAR | Status: DC | PRN
  Administered 2013-12-29: 10 mL via INTRAVENOUS
  Filled 2013-12-29: qty 10

## 2013-12-29 NOTE — Patient Instructions (Signed)
PICC Home Guide A peripherally inserted central catheter (PICC) is a long, thin, flexible tube that is inserted into a vein in the upper arm. It is a form of intravenous (IV) access. It is considered to be a "central" line because the tip of the PICC ends in a large vein in your chest. This large vein is called the superior vena cava (SVC). The PICC tip ends in the SVC because there is a lot of blood flow in the SVC. This allows medicines and IV fluids to be quickly distributed throughout the body. The PICC is inserted using a sterile technique by a specially trained nurse or physician. After the PICC is inserted, a chest X-ray exam is done to be sure it is in the correct place.  A PICC may be placed for different reasons, such as:  To give medicines and liquid nutrition that can only be given through a central line. Examples are:  Certain antibiotic treatments.  Chemotherapy.  Total parenteral nutrition (TPN).  To take frequent blood samples.  To give IV fluids and blood products.  If there is difficulty placing a peripheral intravenous (PIV) catheter. If taken care of properly, a PICC can remain in place for several months. A PICC can also allow a person to go home from the hospital early. Medicine and PICC care can be managed at home by a family member or home health care team. WHAT PROBLEMS CAN HAPPEN WHEN I HAVE A PICC? Problems with a PICC can occasionally occur. These may include the following:  A blood clot (thrombus) forming in or at the tip of the PICC. This can cause the PICC to become clogged. A clot-dissolving medicine called tissue plasminogen activator (tPA) can be given through the PICC to help break up the clot.  Inflammation of the vein (phlebitis) in which the PICC is placed. Signs of inflammation may include redness, pain at the insertion site, red streaks, or being able to feel a "cord" in the vein where the PICC is located.  Infection in the PICC or at the insertion  site. Signs of infection may include fever, chills, redness, swelling, or pus drainage from the PICC insertion site.  PICC movement (malposition). The PICC tip may move from its original position due to excessive physical activity, forceful coughing, sneezing, or vomiting.  A break or cut in the PICC. It is important to not use scissors near the PICC.  Nerve or tendon irritation or injury during PICC insertion. WHAT SHOULD I KEEP IN MIND ABOUT ACTIVITIES WHEN I HAVE A PICC?  You may bend your arm and move it freely. If your PICC is near or at the bend of your elbow, avoid activity with repeated motion at the elbow.  Rest at home for the remainder of the day following PICC line insertion.  Avoid lifting heavy objects as instructed by your health care provider.  Avoid using a crutch with the arm on the same side as your PICC. You may need to use a walker. WHAT SHOULD I KNOW ABOUT MY PICC DRESSING?  Keep your PICC bandage (dressing) clean and dry to prevent infection.  Ask your health care provider when you may shower. Ask your health care provider to teach you how to wrap the PICC when you do take a shower.  Change the PICC dressing as instructed by your health care provider.  Change your PICC dressing if it becomes loose or wet. WHAT SHOULD I KNOW ABOUT PICC CARE?  Check the PICC insertion site   daily for leakage, redness, swelling, or pain.  Do not take a bath, swim, or use hot tubs when you have a PICC. Cover PICC line with clear plastic wrap and tape to keep it dry while showering.  Flush the PICC as directed by your health care provider. Let your health care provider know right away if the PICC is difficult to flush or does not flush. Do not use force to flush the PICC.  Do not use a syringe that is less than 10 mL to flush the PICC.  Never pull or tug on the PICC.  Avoid blood pressure checks on the arm with the PICC.  Keep your PICC identification card with you at all  times.  Do not take the PICC out yourself. Only a trained clinical professional should remove the PICC. SEEK IMMEDIATE MEDICAL CARE IF:  Your PICC is accidentally pulled all the way out. If this happens, cover the insertion site with a bandage or gauze dressing. Do not throw the PICC away. Your health care provider will need to inspect it.  Your PICC was tugged or pulled and has partially come out. Do not  push the PICC back in.  There is any type of drainage, redness, or swelling where the PICC enters the skin.  You cannot flush the PICC, it is difficult to flush, or the PICC leaks around the insertion site when it is flushed.  You hear a "flushing" sound when the PICC is flushed.  You have pain, discomfort, or numbness in your arm, shoulder, or jaw on the same side as the PICC.  You feel your heart "racing" or skipping beats.  You notice a hole or tear in the PICC.  You develop chills or a fever. MAKE SURE YOU:   Understand these instructions.  Will watch your condition.  Will get help right away if you are not doing well or get worse. Document Released: 10/29/2002 Document Revised: 09/08/2013 Document Reviewed: 12/30/2012 ExitCare Patient Information 2015 [employer]. This information is not intended to replace advice given to you by your health care provider. Make sure you discuss any questions you have with your health care provider.  

## 2013-12-30 ENCOUNTER — Telehealth: Payer: Self-pay | Admitting: Oncology

## 2013-12-30 ENCOUNTER — Other Ambulatory Visit: Payer: Self-pay

## 2013-12-30 DIAGNOSIS — D509 Iron deficiency anemia, unspecified: Secondary | ICD-10-CM

## 2013-12-30 NOTE — Telephone Encounter (Signed)
, °

## 2014-01-01 ENCOUNTER — Other Ambulatory Visit: Payer: Medicare Other

## 2014-01-15 ENCOUNTER — Ambulatory Visit (HOSPITAL_BASED_OUTPATIENT_CLINIC_OR_DEPARTMENT_OTHER): Payer: Medicare Other

## 2014-01-15 ENCOUNTER — Other Ambulatory Visit (HOSPITAL_BASED_OUTPATIENT_CLINIC_OR_DEPARTMENT_OTHER): Payer: Medicare Other

## 2014-01-15 ENCOUNTER — Other Ambulatory Visit: Payer: Self-pay | Admitting: Medical Oncology

## 2014-01-15 ENCOUNTER — Telehealth: Payer: Self-pay | Admitting: Oncology

## 2014-01-15 DIAGNOSIS — D72819 Decreased white blood cell count, unspecified: Secondary | ICD-10-CM

## 2014-01-15 DIAGNOSIS — C9200 Acute myeloblastic leukemia, not having achieved remission: Secondary | ICD-10-CM

## 2014-01-15 DIAGNOSIS — Z452 Encounter for adjustment and management of vascular access device: Secondary | ICD-10-CM

## 2014-01-15 DIAGNOSIS — D509 Iron deficiency anemia, unspecified: Secondary | ICD-10-CM

## 2014-01-15 DIAGNOSIS — D696 Thrombocytopenia, unspecified: Secondary | ICD-10-CM

## 2014-01-15 LAB — COMPREHENSIVE METABOLIC PANEL (CC13)
ALBUMIN: 2.4 g/dL — AB (ref 3.5–5.0)
ALT: 8 U/L (ref 0–55)
ANION GAP: 8 meq/L (ref 3–11)
AST: 10 U/L (ref 5–34)
Alkaline Phosphatase: 54 U/L (ref 40–150)
BUN: 10.7 mg/dL (ref 7.0–26.0)
CALCIUM: 8.3 mg/dL — AB (ref 8.4–10.4)
CO2: 25 meq/L (ref 22–29)
CREATININE: 0.9 mg/dL (ref 0.7–1.3)
Chloride: 105 mEq/L (ref 98–109)
Glucose: 105 mg/dl (ref 70–140)
POTASSIUM: 4.1 meq/L (ref 3.5–5.1)
Sodium: 138 mEq/L (ref 136–145)
Total Bilirubin: 0.63 mg/dL (ref 0.20–1.20)
Total Protein: 5.9 g/dL — ABNORMAL LOW (ref 6.4–8.3)

## 2014-01-15 LAB — CBC WITH DIFFERENTIAL/PLATELET
BASO%: 0 % (ref 0.0–2.0)
Basophils Absolute: 0 10*3/uL (ref 0.0–0.1)
EOS%: 4.1 % (ref 0.0–7.0)
Eosinophils Absolute: 0 10*3/uL (ref 0.0–0.5)
HCT: 28.2 % — ABNORMAL LOW (ref 38.4–49.9)
HGB: 9 g/dL — ABNORMAL LOW (ref 13.0–17.1)
LYMPH#: 0.4 10*3/uL — AB (ref 0.9–3.3)
LYMPH%: 83.7 % — ABNORMAL HIGH (ref 14.0–49.0)
MCH: 29.5 pg (ref 27.2–33.4)
MCHC: 31.9 g/dL — AB (ref 32.0–36.0)
MCV: 92.5 fL (ref 79.3–98.0)
MONO#: 0 10*3/uL — AB (ref 0.1–0.9)
MONO%: 0 % (ref 0.0–14.0)
NEUT#: 0.1 10*3/uL — CL (ref 1.5–6.5)
NEUT%: 12.2 % — AB (ref 39.0–75.0)
NRBC: 0 % (ref 0–0)
Platelets: 51 10*3/uL — ABNORMAL LOW (ref 140–400)
RBC: 3.05 10*6/uL — ABNORMAL LOW (ref 4.20–5.82)
RDW: 16.3 % — ABNORMAL HIGH (ref 11.0–14.6)
WBC: 0.5 10*3/uL — AB (ref 4.0–10.3)

## 2014-01-15 LAB — HOLD TUBE, BLOOD BANK

## 2014-01-15 LAB — MAGNESIUM (CC13): Magnesium: 2.1 mg/dl (ref 1.5–2.5)

## 2014-01-15 MED ORDER — HEPARIN SOD (PORK) LOCK FLUSH 100 UNIT/ML IV SOLN
500.0000 [IU] | Freq: Once | INTRAVENOUS | Status: AC
  Administered 2014-01-15: 250 [IU] via INTRAVENOUS
  Filled 2014-01-15: qty 5

## 2014-01-15 MED ORDER — SODIUM CHLORIDE 0.9 % IJ SOLN
10.0000 mL | INTRAMUSCULAR | Status: DC | PRN
  Administered 2014-01-15: 10 mL via INTRAVENOUS
  Filled 2014-01-15: qty 10

## 2014-01-15 MED ORDER — HEPARIN SOD (PORK) LOCK FLUSH 100 UNIT/ML IV SOLN
500.0000 [IU] | Freq: Once | INTRAVENOUS | Status: DC
  Filled 2014-01-15: qty 5

## 2014-01-15 MED ORDER — SODIUM CHLORIDE 0.9 % IJ SOLN
10.0000 mL | INTRAMUSCULAR | Status: DC | PRN
  Filled 2014-01-15: qty 10

## 2014-01-15 NOTE — Patient Instructions (Addendum)
PICC Home Guide A peripherally inserted central catheter (PICC) is a long, thin, flexible tube that is inserted into a vein in the upper arm. It is a form of intravenous (IV) access. It is considered to be a "central" line because the tip of the PICC ends in a large vein in your chest. This large vein is called the superior vena cava (SVC). The PICC tip ends in the SVC because there is a lot of blood flow in the SVC. This allows medicines and IV fluids to be quickly distributed throughout the body. The PICC is inserted using a sterile technique by a specially trained nurse or physician. After the PICC is inserted, a chest X-ray exam is done to be sure it is in the correct place.  A PICC may be placed for different reasons, such as:  To give medicines and liquid nutrition that can only be given through a central line. Examples are:  Certain antibiotic treatments.  Chemotherapy.  Total parenteral nutrition (TPN).  To take frequent blood samples.  To give IV fluids and blood products.  If there is difficulty placing a peripheral intravenous (PIV) catheter. If taken care of properly, a PICC can remain in place for several months. A PICC can also allow a person to go home from the hospital early. Medicine and PICC care can be managed at home by a family member or home health care team. WHAT PROBLEMS CAN HAPPEN WHEN I HAVE A PICC? Problems with a PICC can occasionally occur. These may include the following:  A blood clot (thrombus) forming in or at the tip of the PICC. This can cause the PICC to become clogged. A clot-dissolving medicine called tissue plasminogen activator (tPA) can be given through the PICC to help break up the clot.  Inflammation of the vein (phlebitis) in which the PICC is placed. Signs of inflammation may include redness, pain at the insertion site, red streaks, or being able to feel a "cord" in the vein where the PICC is located.  Infection in the PICC or at the insertion  site. Signs of infection may include fever, chills, redness, swelling, or pus drainage from the PICC insertion site.  PICC movement (malposition). The PICC tip may move from its original position due to excessive physical activity, forceful coughing, sneezing, or vomiting.  A break or cut in the PICC. It is important to not use scissors near the PICC.  Nerve or tendon irritation or injury during PICC insertion. WHAT SHOULD I KEEP IN MIND ABOUT ACTIVITIES WHEN I HAVE A PICC?  You may bend your arm and move it freely. If your PICC is near or at the bend of your elbow, avoid activity with repeated motion at the elbow.  Rest at home for the remainder of the day following PICC line insertion.  Avoid lifting heavy objects as instructed by your health care provider.  Avoid using a crutch with the arm on the same side as your PICC. You may need to use a walker. WHAT SHOULD I KNOW ABOUT MY PICC DRESSING?  Keep your PICC bandage (dressing) clean and dry to prevent infection.  Ask your health care provider when you may shower. Ask your health care provider to teach you how to wrap the PICC when you do take a shower.  Change the PICC dressing as instructed by your health care provider.  Change your PICC dressing if it becomes loose or wet. WHAT SHOULD I KNOW ABOUT PICC CARE?  Check the PICC insertion site   daily for leakage, redness, swelling, or pain.  Do not take a bath, swim, or use hot tubs when you have a PICC. Cover PICC line with clear plastic wrap and tape to keep it dry while showering.  Flush the PICC as directed by your health care provider. Let your health care provider know right away if the PICC is difficult to flush or does not flush. Do not use force to flush the PICC.  Do not use a syringe that is less than 10 mL to flush the PICC.  Never pull or tug on the PICC.  Avoid blood pressure checks on the arm with the PICC.  Keep your PICC identification card with you at all  times.  Do not take the PICC out yourself. Only a trained clinical professional should remove the PICC. SEEK IMMEDIATE MEDICAL CARE IF:  Your PICC is accidentally pulled all the way out. If this happens, cover the insertion site with a bandage or gauze dressing. Do not throw the PICC away. Your health care provider will need to inspect it.  Your PICC was tugged or pulled and has partially come out. Do not  push the PICC back in.  There is any type of drainage, redness, or swelling where the PICC enters the skin.  You cannot flush the PICC, it is difficult to flush, or the PICC leaks around the insertion site when it is flushed.  You hear a "flushing" sound when the PICC is flushed.  You have pain, discomfort, or numbness in your arm, shoulder, or jaw on the same side as the PICC.  You feel your heart "racing" or skipping beats.  You notice a hole or tear in the PICC.  You develop chills or a fever. MAKE SURE YOU:   Understand these instructions.  Will watch your condition.  Will get help right away if you are not doing well or get worse. Document Released: 10/29/2002 Document Revised: 09/08/2013 Document Reviewed: 12/30/2012 [facility] Patient Information 2015 [place], [place]. This information is not intended to replace advice given to you by your health care provider. Make sure you discuss any questions you have with your health care provider. PICC Home Guide A peripherally inserted central catheter (PICC) is a long, thin, flexible tube that is inserted into a vein in the upper arm. It is a form of intravenous (IV) access. It is considered to be a "central" line because the tip of the PICC ends in a large vein in your chest. This large vein is called the superior vena cava (SVC). The PICC tip ends in the SVC because there is a lot of blood flow in the SVC. This allows medicines and IV fluids to be quickly distributed throughout the body. The PICC is inserted using a sterile technique  by a specially trained nurse or physician. After the PICC is inserted, a chest X-ray exam is done to be sure it is in the correct place.  A PICC may be placed for different reasons, such as:  To give medicines and liquid nutrition that can only be given through a central line. Examples are:  Certain antibiotic treatments.  Chemotherapy.  Total parenteral nutrition (TPN).  To take frequent blood samples.  To give IV fluids and blood products.  If there is difficulty placing a peripheral intravenous (PIV) catheter. If taken care of properly, a PICC can remain in place for several months. A PICC can also allow a person to go home from the hospital early. Medicine and PICC care  can be managed at home by a family member or home health care team. WHAT PROBLEMS CAN [place] A PICC? Problems with a PICC can occasionally occur. These may include the following:  A blood clot (thrombus) forming in or at the tip of the PICC. This can cause the PICC to become clogged. A clot-dissolving medicine called tissue plasminogen activator (tPA) can be given through the PICC to help break up the clot.  Inflammation of the vein (phlebitis) in which the PICC is placed. Signs of inflammation may include redness, pain at the insertion site, red streaks, or being able to feel a "cord" in the vein where the PICC is located.  Infection in the PICC or at the insertion site. Signs of infection may include fever, chills, redness, swelling, or pus drainage from the PICC insertion site.  PICC movement (malposition). The PICC tip may move from its original position due to excessive physical activity, forceful coughing, sneezing, or vomiting.  A break or cut in the PICC. It is important to not use scissors near the PICC.  Nerve or tendon irritation or injury during PICC insertion. WHAT SHOULD I KEEP IN MIND ABOUT ACTIVITIES WHEN I HAVE A PICC?  You may bend your arm and move it freely. If your PICC is near or  at the bend of your elbow, avoid activity with repeated motion at the elbow.  Rest at home for the remainder of the day following PICC line insertion.  Avoid lifting heavy objects as instructed by your health care provider.  Avoid using a crutch with the arm on the same side as your PICC. You may need to use a walker. WHAT SHOULD I KNOW ABOUT MY PICC DRESSING?  Keep your PICC bandage (dressing) clean and dry to prevent infection.  Ask your health care provider when you may shower. Ask your health care provider to teach you how to wrap the PICC when you do take a shower.  Change the PICC dressing as instructed by your health care provider.  Change your PICC dressing if it becomes loose or wet. WHAT SHOULD I KNOW ABOUT PICC CARE?  Check the PICC insertion site daily for leakage, redness, swelling, or pain.  Do not take a bath, swim, or use hot tubs when you have a PICC. Cover PICC line with clear plastic wrap and tape to keep it dry while showering.  Flush the PICC as directed by your health care provider. Let your health care provider know right away if the PICC is difficult to flush or does not flush. Do not use force to flush the PICC.  Do not use a syringe that is less than 10 mL to flush the PICC.  Never pull or tug on the PICC.  Avoid blood pressure checks on the arm with the PICC.  Keep your PICC identification card with you at all times.  Do not take the PICC out yourself. Only a trained clinical professional should remove the PICC. SEEK IMMEDIATE MEDICAL CARE IF:  Your PICC is accidentally pulled all the way out. If this happens, cover the insertion site with a bandage or gauze dressing. Do not throw the PICC away. Your health care provider will need to inspect it.  Your PICC was tugged or pulled and has partially come out. Do not  push the PICC back in.  There is any type of drainage, redness, or swelling where the PICC enters the skin.  You cannot flush the PICC, it  is difficult  to flush, or the PICC leaks around the insertion site when it is flushed.  You hear a "flushing" sound when the PICC is flushed.  You have pain, discomfort, or numbness in your arm, shoulder, or jaw on the same side as the PICC.  You feel your heart "racing" or skipping beats.  You notice a hole or tear in the PICC.  You develop chills or a fever. MAKE SURE YOU:   Understand these instructions.  Will watch your condition.  Will get help right away if you are not doing well or get worse. Document Released: 10/29/2002 Document Revised: 09/08/2013 Document Reviewed: 12/30/2012 [facility] Patient Information 2015 [place], [place]. This information is not intended to replace advice given to you by your health care provider. Make sure you discuss any questions you have with your health care provider.

## 2014-01-15 NOTE — Telephone Encounter (Signed)
Pt came in w/orders from [facility], I advised pt I would give information to [facility] nurse and give him a call once the order is put in....Marland KitchenMarland KitchenKJ

## 2014-01-16 ENCOUNTER — Other Ambulatory Visit: Payer: Self-pay | Admitting: Medical Oncology

## 2014-01-16 ENCOUNTER — Telehealth: Payer: Self-pay | Admitting: Oncology

## 2014-01-16 DIAGNOSIS — D72819 Decreased white blood cell count, unspecified: Secondary | ICD-10-CM

## 2014-01-16 NOTE — Telephone Encounter (Signed)
s.w. pt wife and advised on Sept appt....pt ok and aware

## 2014-01-16 NOTE — Progress Notes (Signed)
Spouse called office informing us that pt needs lab appts per [facility].  POF sent. Schedulers to call pt with appts.  MD informed of lab orders requested by [facility].

## 2014-01-19 ENCOUNTER — Other Ambulatory Visit (HOSPITAL_BASED_OUTPATIENT_CLINIC_OR_DEPARTMENT_OTHER): Payer: Medicare Other

## 2014-01-19 ENCOUNTER — Ambulatory Visit (HOSPITAL_COMMUNITY)
Admission: RE | Admit: 2014-01-19 | Discharge: 2014-01-19 | Disposition: A | Discharge status: Home / Self Care | Payer: Medicare Other | Source: Ambulatory Visit | Attending: Oncology | Admitting: Oncology

## 2014-01-19 ENCOUNTER — Telehealth: Payer: Self-pay | Admitting: Oncology

## 2014-01-19 ENCOUNTER — Encounter: Payer: Self-pay | Admitting: *Deleted

## 2014-01-19 ENCOUNTER — Other Ambulatory Visit: Payer: Self-pay | Admitting: *Deleted

## 2014-01-19 ENCOUNTER — Ambulatory Visit (HOSPITAL_BASED_OUTPATIENT_CLINIC_OR_DEPARTMENT_OTHER): Payer: Medicare Other

## 2014-01-19 DIAGNOSIS — D6481 Anemia due to antineoplastic chemotherapy: Secondary | ICD-10-CM | POA: Insufficient documentation

## 2014-01-19 DIAGNOSIS — C9200 Acute myeloblastic leukemia, not having achieved remission: Secondary | ICD-10-CM

## 2014-01-19 DIAGNOSIS — T451X5A Adverse effect of antineoplastic and immunosuppressive drugs, initial encounter: Secondary | ICD-10-CM | POA: Insufficient documentation

## 2014-01-19 DIAGNOSIS — Z452 Encounter for adjustment and management of vascular access device: Secondary | ICD-10-CM

## 2014-01-19 DIAGNOSIS — D72819 Decreased white blood cell count, unspecified: Secondary | ICD-10-CM

## 2014-01-19 LAB — CBC WITH DIFFERENTIAL/PLATELET
BASO%: 0 % (ref 0.0–2.0)
Basophils Absolute: 0 10*3/uL (ref 0.0–0.1)
EOS%: 0 % (ref 0.0–7.0)
Eosinophils Absolute: 0 10*3/uL (ref 0.0–0.5)
HCT: 25.3 % — ABNORMAL LOW (ref 38.4–49.9)
HGB: 8.2 g/dL — ABNORMAL LOW (ref 13.0–17.1)
LYMPH%: 78 % — AB (ref 14.0–49.0)
MCH: 29.7 pg (ref 27.2–33.4)
MCHC: 32.4 g/dL (ref 32.0–36.0)
MCV: 91.7 fL (ref 79.3–98.0)
MONO#: 0 10*3/uL — ABNORMAL LOW (ref 0.1–0.9)
MONO%: 2.4 % (ref 0.0–14.0)
NEUT#: 0.1 10*3/uL — CL (ref 1.5–6.5)
NEUT%: 19.6 % — ABNORMAL LOW (ref 39.0–75.0)
NRBC: 0 % (ref 0–0)
Platelets: 36 10*3/uL — ABNORMAL LOW (ref 140–400)
RBC: 2.76 10*6/uL — AB (ref 4.20–5.82)
RDW: 15.9 % — ABNORMAL HIGH (ref 11.0–14.6)
WBC: 0.4 10*3/uL — CL (ref 4.0–10.3)
lymph#: 0.3 10*3/uL — ABNORMAL LOW (ref 0.9–3.3)

## 2014-01-19 LAB — COMPREHENSIVE METABOLIC PANEL (CC13)
ALK PHOS: 57 U/L (ref 40–150)
ALT: <6 U/L (ref 0–55)
AST: 6 U/L (ref 5–34)
Albumin: 2.5 g/dL — ABNORMAL LOW (ref 3.5–5.0)
Anion Gap: 9 mEq/L (ref 3–11)
BUN: 9.9 mg/dL (ref 7.0–26.0)
CALCIUM: 8.3 mg/dL — AB (ref 8.4–10.4)
CO2: 25 mEq/L (ref 22–29)
CREATININE: 0.9 mg/dL (ref 0.7–1.3)
Chloride: 104 mEq/L (ref 98–109)
Glucose: 106 mg/dl (ref 70–140)
Potassium: 3.8 mEq/L (ref 3.5–5.1)
Sodium: 138 mEq/L (ref 136–145)
Total Bilirubin: 0.63 mg/dL (ref 0.20–1.20)
Total Protein: 5.9 g/dL — ABNORMAL LOW (ref 6.4–8.3)

## 2014-01-19 LAB — PREPARE RBC (CROSSMATCH)

## 2014-01-19 LAB — MAGNESIUM (CC13): Magnesium: 1.9 mg/dl (ref 1.5–2.5)

## 2014-01-19 LAB — HOLD TUBE, BLOOD BANK

## 2014-01-19 MED ORDER — HEPARIN SOD (PORK) LOCK FLUSH 100 UNIT/ML IV SOLN
500.0000 [IU] | Freq: Every day | INTRAVENOUS | Status: DC | PRN
  Filled 2014-01-19: qty 5

## 2014-01-19 MED ORDER — SODIUM CHLORIDE 0.9 % IJ SOLN
10.0000 mL | INTRAMUSCULAR | Status: DC | PRN
  Administered 2014-01-19: 10 mL via INTRAVENOUS
  Filled 2014-01-19: qty 10

## 2014-01-19 MED ORDER — HEPARIN SOD (PORK) LOCK FLUSH 100 UNIT/ML IV SOLN
500.0000 [IU] | Freq: Once | INTRAVENOUS | Status: AC
  Administered 2014-01-19: 250 [IU] via INTRAVENOUS
  Filled 2014-01-19: qty 5

## 2014-01-19 NOTE — Telephone Encounter (Signed)
added additional lb/flush appts per 9/14 and 9/11 pof's - multiple pof's sent 9/14. s/w pt he is aware and will get new schedule tomorrow. inf for tomorrow already on schedule and pt aware.

## 2014-01-19 NOTE — Patient Instructions (Signed)
PICC Home Guide A peripherally inserted central catheter (PICC) is a long, thin, flexible tube that is inserted into a vein in the upper arm. It is a form of intravenous (IV) access. It is considered to be a "central" line because the tip of the PICC ends in a large vein in your chest. This large vein is called the superior vena cava (SVC). The PICC tip ends in the SVC because there is a lot of blood flow in the SVC. This allows medicines and IV fluids to be quickly distributed throughout the body. The PICC is inserted using a sterile technique by a specially trained nurse or physician. After the PICC is inserted, a chest X-ray exam is done to be sure it is in the correct place.  A PICC may be placed for different reasons, such as:  To give medicines and liquid nutrition that can only be given through a central line. Examples are:  Certain antibiotic treatments.  Chemotherapy.  Total parenteral nutrition (TPN).  To take frequent blood samples.  To give IV fluids and blood products.  If there is difficulty placing a peripheral intravenous (PIV) catheter. If taken care of properly, a PICC can remain in place for several months. A PICC can also allow a person to go home from the hospital early. Medicine and PICC care can be managed at home by a family member or home health care team. WHAT PROBLEMS CAN HAPPEN WHEN I HAVE A PICC? Problems with a PICC can occasionally occur. These may include the following:  A blood clot (thrombus) forming in or at the tip of the PICC. This can cause the PICC to become clogged. A clot-dissolving medicine called tissue plasminogen activator (tPA) can be given through the PICC to help break up the clot.  Inflammation of the vein (phlebitis) in which the PICC is placed. Signs of inflammation may include redness, pain at the insertion site, red streaks, or being able to feel a "cord" in the vein where the PICC is located.  Infection in the PICC or at the insertion  site. Signs of infection may include fever, chills, redness, swelling, or pus drainage from the PICC insertion site.  PICC movement (malposition). The PICC tip may move from its original position due to excessive physical activity, forceful coughing, sneezing, or vomiting.  A break or cut in the PICC. It is important to not use scissors near the PICC.  Nerve or tendon irritation or injury during PICC insertion. WHAT SHOULD I KEEP IN MIND ABOUT ACTIVITIES WHEN I HAVE A PICC?  You may bend your arm and move it freely. If your PICC is near or at the bend of your elbow, avoid activity with repeated motion at the elbow.  Rest at home for the remainder of the day following PICC line insertion.  Avoid lifting heavy objects as instructed by your health care provider.  Avoid using a crutch with the arm on the same side as your PICC. You may need to use a walker. WHAT SHOULD I KNOW ABOUT MY PICC DRESSING?  Keep your PICC bandage (dressing) clean and dry to prevent infection.  Ask your health care provider when you may shower. Ask your health care provider to teach you how to wrap the PICC when you do take a shower.  Change the PICC dressing as instructed by your health care provider.  Change your PICC dressing if it becomes loose or wet. WHAT SHOULD I KNOW ABOUT PICC CARE?  Check the PICC insertion site   daily for leakage, redness, swelling, or pain.  Do not take a bath, swim, or use hot tubs when you have a PICC. Cover PICC line with clear plastic wrap and tape to keep it dry while showering.  Flush the PICC as directed by your health care provider. Let your health care provider know right away if the PICC is difficult to flush or does not flush. Do not use force to flush the PICC.  Do not use a syringe that is less than 10 mL to flush the PICC.  Never pull or tug on the PICC.  Avoid blood pressure checks on the arm with the PICC.  Keep your PICC identification card with you at all  times.  Do not take the PICC out yourself. Only a trained clinical professional should remove the PICC. SEEK IMMEDIATE MEDICAL CARE IF:  Your PICC is accidentally pulled all the way out. If this happens, cover the insertion site with a bandage or gauze dressing. Do not throw the PICC away. Your health care provider will need to inspect it.  Your PICC was tugged or pulled and has partially come out. Do not  push the PICC back in.  There is any type of drainage, redness, or swelling where the PICC enters the skin.  You cannot flush the PICC, it is difficult to flush, or the PICC leaks around the insertion site when it is flushed.  You hear a "flushing" sound when the PICC is flushed.  You have pain, discomfort, or numbness in your arm, shoulder, or jaw on the same side as the PICC.  You feel your heart "racing" or skipping beats.  You notice a hole or tear in the PICC.  You develop chills or a fever. MAKE SURE YOU:   Understand these instructions.  Will watch your condition.  Will get help right away if you are not doing well or get worse. Document Released: 10/29/2002 Document Revised: 09/08/2013 Document Reviewed: 12/30/2012 ExitCare Patient Information 2015 [employer]. This information is not intended to replace advice given to you by your health care provider. Make sure you discuss any questions you have with your health care provider.  

## 2014-01-19 NOTE — Progress Notes (Signed)
Patient in for labs. hgb 8.2  Per [facility], Blood transfusion set up for 01/20/14 @ 1300. Patient aware. Banded and orders are in epic

## 2014-01-20 ENCOUNTER — Ambulatory Visit (HOSPITAL_BASED_OUTPATIENT_CLINIC_OR_DEPARTMENT_OTHER): Payer: Medicare Other

## 2014-01-20 VITALS — BP 154/75 | HR 71 | Temp 98.7°F | Resp 18

## 2014-01-20 DIAGNOSIS — C9200 Acute myeloblastic leukemia, not having achieved remission: Secondary | ICD-10-CM

## 2014-01-20 DIAGNOSIS — D6481 Anemia due to antineoplastic chemotherapy: Secondary | ICD-10-CM

## 2014-01-20 DIAGNOSIS — T451X5A Adverse effect of antineoplastic and immunosuppressive drugs, initial encounter: Principal | ICD-10-CM

## 2014-01-20 DIAGNOSIS — D649 Anemia, unspecified: Secondary | ICD-10-CM

## 2014-01-20 MED ORDER — ACETAMINOPHEN 325 MG PO TABS
ORAL_TABLET | ORAL | Status: AC
  Filled 2014-01-20: qty 2

## 2014-01-20 MED ORDER — HEPARIN SOD (PORK) LOCK FLUSH 100 UNIT/ML IV SOLN
500.0000 [IU] | Freq: Every day | INTRAVENOUS | Status: AC | PRN
  Administered 2014-01-20: 500 [IU]
  Filled 2014-01-20: qty 5

## 2014-01-20 MED ORDER — SODIUM CHLORIDE 0.9 % IJ SOLN
10.0000 mL | INTRAMUSCULAR | Status: AC | PRN
  Administered 2014-01-20: 10 mL
  Filled 2014-01-20: qty 10

## 2014-01-20 MED ORDER — SODIUM CHLORIDE 0.9 % IV SOLN: 250.0000 mL | Freq: Once | INTRAVENOUS | Status: DC

## 2014-01-20 MED ORDER — DIPHENHYDRAMINE HCL 25 MG PO CAPS
ORAL_CAPSULE | ORAL | Status: AC
  Filled 2014-01-20: qty 1

## 2014-01-20 MED ORDER — DIPHENHYDRAMINE HCL 25 MG PO CAPS
25.0000 mg | ORAL_CAPSULE | Freq: Once | ORAL | Status: AC
  Administered 2014-01-20: 25 mg via ORAL

## 2014-01-20 MED ORDER — ACETAMINOPHEN 325 MG PO TABS
650.0000 mg | ORAL_TABLET | Freq: Once | ORAL | Status: AC
  Administered 2014-01-20: 650 mg via ORAL

## 2014-01-20 NOTE — Patient Instructions (Signed)

## 2014-01-20 NOTE — Progress Notes (Signed)
Offered to administer flu vaccine to patient and he declines. He prefers to get vaccine further into the fall and usually gets it at the [place].

## 2014-01-21 LAB — TYPE AND SCREEN
ABO/RH(D): O POS
ANTIBODY SCREEN: NEGATIVE
Unit division: 0
Unit division: 0

## 2014-01-22 ENCOUNTER — Ambulatory Visit (HOSPITAL_BASED_OUTPATIENT_CLINIC_OR_DEPARTMENT_OTHER): Payer: Medicare Other

## 2014-01-22 ENCOUNTER — Other Ambulatory Visit (HOSPITAL_BASED_OUTPATIENT_CLINIC_OR_DEPARTMENT_OTHER): Payer: Medicare Other

## 2014-01-22 VITALS — BP 122/63 | HR 81 | Temp 98.1°F

## 2014-01-22 DIAGNOSIS — D72819 Decreased white blood cell count, unspecified: Secondary | ICD-10-CM

## 2014-01-22 DIAGNOSIS — Z452 Encounter for adjustment and management of vascular access device: Secondary | ICD-10-CM

## 2014-01-22 DIAGNOSIS — D649 Anemia, unspecified: Secondary | ICD-10-CM

## 2014-01-22 DIAGNOSIS — C9200 Acute myeloblastic leukemia, not having achieved remission: Secondary | ICD-10-CM

## 2014-01-22 LAB — CBC WITH DIFFERENTIAL/PLATELET
BASO%: 0 % (ref 0.0–2.0)
Basophils Absolute: 0 10*3/uL (ref 0.0–0.1)
EOS ABS: 0 10*3/uL (ref 0.0–0.5)
EOS%: 2.5 % (ref 0.0–7.0)
HEMATOCRIT: 30.1 % — AB (ref 38.4–49.9)
HGB: 10 g/dL — ABNORMAL LOW (ref 13.0–17.1)
LYMPH%: 80 % — AB (ref 14.0–49.0)
MCH: 30.4 pg (ref 27.2–33.4)
MCHC: 33.2 g/dL (ref 32.0–36.0)
MCV: 91.5 fL (ref 79.3–98.0)
MONO#: 0 10*3/uL — ABNORMAL LOW (ref 0.1–0.9)
MONO%: 2.5 % (ref 0.0–14.0)
NEUT%: 15 % — ABNORMAL LOW (ref 39.0–75.0)
NEUTROS ABS: 0.1 10*3/uL — AB (ref 1.5–6.5)
NRBC: 0 % (ref 0–0)
PLATELETS: 30 10*3/uL — AB (ref 140–400)
RBC: 3.29 10*6/uL — AB (ref 4.20–5.82)
RDW: 15.9 % — ABNORMAL HIGH (ref 11.0–14.6)
WBC: 0.4 10*3/uL — AB (ref 4.0–10.3)
lymph#: 0.3 10*3/uL — ABNORMAL LOW (ref 0.9–3.3)

## 2014-01-22 LAB — TECHNOLOGIST REVIEW

## 2014-01-22 MED ORDER — SODIUM CHLORIDE 0.9 % IJ SOLN
10.0000 mL | INTRAMUSCULAR | Status: DC | PRN
  Administered 2014-01-22: 10 mL via INTRAVENOUS
  Filled 2014-01-22: qty 10

## 2014-01-22 MED ORDER — HEPARIN SOD (PORK) LOCK FLUSH 100 UNIT/ML IV SOLN
500.0000 [IU] | Freq: Once | INTRAVENOUS | Status: AC
  Administered 2014-01-22: 500 [IU] via INTRAVENOUS
  Filled 2014-01-22: qty 5

## 2014-01-22 NOTE — Patient Instructions (Signed)
PICC Home Guide A peripherally inserted central catheter (PICC) is a long, thin, flexible tube that is inserted into a vein in the upper arm. It is a form of intravenous (IV) access. It is considered to be a "central" line because the tip of the PICC ends in a large vein in your chest. This large vein is called the superior vena cava (SVC). The PICC tip ends in the SVC because there is a lot of blood flow in the SVC. This allows medicines and IV fluids to be quickly distributed throughout the body. The PICC is inserted using a sterile technique by a specially trained nurse or physician. After the PICC is inserted, a chest X-ray exam is done to be sure it is in the correct place.  A PICC may be placed for different reasons, such as:  To give medicines and liquid nutrition that can only be given through a central line. Examples are:  Certain antibiotic treatments.  Chemotherapy.  Total parenteral nutrition (TPN).  To take frequent blood samples.  To give IV fluids and blood products.  If there is difficulty placing a peripheral intravenous (PIV) catheter. If taken care of properly, a PICC can remain in place for several months. A PICC can also allow a person to go home from the hospital early. Medicine and PICC care can be managed at home by a family member or home health care team. WHAT PROBLEMS CAN HAPPEN WHEN I HAVE A PICC? Problems with a PICC can occasionally occur. These may include the following:  A blood clot (thrombus) forming in or at the tip of the PICC. This can cause the PICC to become clogged. A clot-dissolving medicine called tissue plasminogen activator (tPA) can be given through the PICC to help break up the clot.  Inflammation of the vein (phlebitis) in which the PICC is placed. Signs of inflammation may include redness, pain at the insertion site, red streaks, or being able to feel a "cord" in the vein where the PICC is located.  Infection in the PICC or at the insertion  site. Signs of infection may include fever, chills, redness, swelling, or pus drainage from the PICC insertion site.  PICC movement (malposition). The PICC tip may move from its original position due to excessive physical activity, forceful coughing, sneezing, or vomiting.  A break or cut in the PICC. It is important to not use scissors near the PICC.  Nerve or tendon irritation or injury during PICC insertion. WHAT SHOULD I KEEP IN MIND ABOUT ACTIVITIES WHEN I HAVE A PICC?  You may bend your arm and move it freely. If your PICC is near or at the bend of your elbow, avoid activity with repeated motion at the elbow.  Rest at home for the remainder of the day following PICC line insertion.  Avoid lifting heavy objects as instructed by your health care provider.  Avoid using a crutch with the arm on the same side as your PICC. You may need to use a walker. WHAT SHOULD I KNOW ABOUT MY PICC DRESSING?  Keep your PICC bandage (dressing) clean and dry to prevent infection.  Ask your health care provider when you may shower. Ask your health care provider to teach you how to wrap the PICC when you do take a shower.  Change the PICC dressing as instructed by your health care provider.  Change your PICC dressing if it becomes loose or wet. WHAT SHOULD I KNOW ABOUT PICC CARE?  Check the PICC insertion site   daily for leakage, redness, swelling, or pain.  Do not take a bath, swim, or use hot tubs when you have a PICC. Cover PICC line with clear plastic wrap and tape to keep it dry while showering.  Flush the PICC as directed by your health care provider. Let your health care provider know right away if the PICC is difficult to flush or does not flush. Do not use force to flush the PICC.  Do not use a syringe that is less than 10 mL to flush the PICC.  Never pull or tug on the PICC.  Avoid blood pressure checks on the arm with the PICC.  Keep your PICC identification card with you at all  times.  Do not take the PICC out yourself. Only a trained clinical professional should remove the PICC. SEEK IMMEDIATE MEDICAL CARE IF:  Your PICC is accidentally pulled all the way out. If this happens, cover the insertion site with a bandage or gauze dressing. Do not throw the PICC away. Your health care provider will need to inspect it.  Your PICC was tugged or pulled and has partially come out. Do not  push the PICC back in.  There is any type of drainage, redness, or swelling where the PICC enters the skin.  You cannot flush the PICC, it is difficult to flush, or the PICC leaks around the insertion site when it is flushed.  You hear a "flushing" sound when the PICC is flushed.  You have pain, discomfort, or numbness in your arm, shoulder, or jaw on the same side as the PICC.  You feel your heart "racing" or skipping beats.  You notice a hole or tear in the PICC.  You develop chills or a fever. MAKE SURE YOU:   Understand these instructions.  Will watch your condition.  Will get help right away if you are not doing well or get worse. Document Released: 10/29/2002 Document Revised: 09/08/2013 Document Reviewed: 12/30/2012 ExitCare Patient Information 2015 [employer]. This information is not intended to replace advice given to you by your health care provider. Make sure you discuss any questions you have with your health care provider.  

## 2014-01-26 ENCOUNTER — Ambulatory Visit (HOSPITAL_BASED_OUTPATIENT_CLINIC_OR_DEPARTMENT_OTHER): Payer: Medicare Other

## 2014-01-26 ENCOUNTER — Telehealth: Payer: Self-pay | Admitting: Medical Oncology

## 2014-01-26 ENCOUNTER — Other Ambulatory Visit (HOSPITAL_BASED_OUTPATIENT_CLINIC_OR_DEPARTMENT_OTHER): Payer: Medicare Other

## 2014-01-26 DIAGNOSIS — C9200 Acute myeloblastic leukemia, not having achieved remission: Secondary | ICD-10-CM

## 2014-01-26 DIAGNOSIS — D72819 Decreased white blood cell count, unspecified: Secondary | ICD-10-CM

## 2014-01-26 DIAGNOSIS — Z452 Encounter for adjustment and management of vascular access device: Secondary | ICD-10-CM

## 2014-01-26 LAB — COMPREHENSIVE METABOLIC PANEL (CC13)
ALT: <6 U/L (ref 0–55)
ANION GAP: 9 meq/L (ref 3–11)
AST: 8 U/L (ref 5–34)
Albumin: 2.3 g/dL — ABNORMAL LOW (ref 3.5–5.0)
Alkaline Phosphatase: 60 U/L (ref 40–150)
BILIRUBIN TOTAL: 0.66 mg/dL (ref 0.20–1.20)
BUN: 9.8 mg/dL (ref 7.0–26.0)
CO2: 26 meq/L (ref 22–29)
Calcium: 8.3 mg/dL — ABNORMAL LOW (ref 8.4–10.4)
Chloride: 104 mEq/L (ref 98–109)
Creatinine: 0.8 mg/dL (ref 0.7–1.3)
GLUCOSE: 91 mg/dL (ref 70–140)
Potassium: 4.1 mEq/L (ref 3.5–5.1)
SODIUM: 139 meq/L (ref 136–145)
TOTAL PROTEIN: 5.9 g/dL — AB (ref 6.4–8.3)

## 2014-01-26 LAB — CBC WITH DIFFERENTIAL/PLATELET
BASO%: 0 % (ref 0.0–2.0)
BASOS ABS: 0 10*3/uL (ref 0.0–0.1)
EOS ABS: 0 10*3/uL (ref 0.0–0.5)
EOS%: 2.2 % (ref 0.0–7.0)
HEMATOCRIT: 28.9 % — AB (ref 38.4–49.9)
HEMOGLOBIN: 9.5 g/dL — AB (ref 13.0–17.1)
LYMPH%: 88.9 % — ABNORMAL HIGH (ref 14.0–49.0)
MCH: 30.3 pg (ref 27.2–33.4)
MCHC: 32.9 g/dL (ref 32.0–36.0)
MCV: 92 fL (ref 79.3–98.0)
MONO#: 0 10*3/uL — ABNORMAL LOW (ref 0.1–0.9)
MONO%: 2.2 % (ref 0.0–14.0)
NEUT#: 0 10*3/uL — CL (ref 1.5–6.5)
NEUT%: 6.7 % — ABNORMAL LOW (ref 39.0–75.0)
Platelets: 52 10*3/uL — ABNORMAL LOW (ref 140–400)
RBC: 3.14 10*6/uL — ABNORMAL LOW (ref 4.20–5.82)
RDW: 16.5 % — AB (ref 11.0–14.6)
WBC: 0.5 10*3/uL — CL (ref 4.0–10.3)
lymph#: 0.4 10*3/uL — ABNORMAL LOW (ref 0.9–3.3)
nRBC: 0 % (ref 0–0)

## 2014-01-26 LAB — MAGNESIUM (CC13): MAGNESIUM: 2 mg/dL (ref 1.5–2.5)

## 2014-01-26 MED ORDER — SODIUM CHLORIDE 0.9 % IJ SOLN
10.0000 mL | INTRAMUSCULAR | Status: DC | PRN
  Administered 2014-01-26: 10 mL via INTRAVENOUS
  Filled 2014-01-26: qty 10

## 2014-01-26 MED ORDER — HEPARIN SOD (PORK) LOCK FLUSH 100 UNIT/ML IV SOLN
500.0000 [IU] | Freq: Once | INTRAVENOUS | Status: AC
  Administered 2014-01-26: 250 [IU] via INTRAVENOUS
  Filled 2014-01-26: qty 5

## 2014-01-26 NOTE — Patient Instructions (Signed)
PICC Home Guide A peripherally inserted central catheter (PICC) is a long, thin, flexible tube that is inserted into a vein in the upper arm. It is a form of intravenous (IV) access. It is considered to be a "central" line because the tip of the PICC ends in a large vein in your chest. This large vein is called the superior vena cava (SVC). The PICC tip ends in the SVC because there is a lot of blood flow in the SVC. This allows medicines and IV fluids to be quickly distributed throughout the body. The PICC is inserted using a sterile technique by a specially trained nurse or physician. After the PICC is inserted, a chest X-ray exam is done to be sure it is in the correct place.  A PICC may be placed for different reasons, such as:  To give medicines and liquid nutrition that can only be given through a central line. Examples are:  Certain antibiotic treatments.  Chemotherapy.  Total parenteral nutrition (TPN).  To take frequent blood samples.  To give IV fluids and blood products.  If there is difficulty placing a peripheral intravenous (PIV) catheter. If taken care of properly, a PICC can remain in place for several months. A PICC can also allow a person to go home from the hospital early. Medicine and PICC care can be managed at home by a family member or home health care team. WHAT PROBLEMS CAN HAPPEN WHEN I HAVE A PICC? Problems with a PICC can occasionally occur. These may include the following:  A blood clot (thrombus) forming in or at the tip of the PICC. This can cause the PICC to become clogged. A clot-dissolving medicine called tissue plasminogen activator (tPA) can be given through the PICC to help break up the clot.  Inflammation of the vein (phlebitis) in which the PICC is placed. Signs of inflammation may include redness, pain at the insertion site, red streaks, or being able to feel a "cord" in the vein where the PICC is located.  Infection in the PICC or at the insertion  site. Signs of infection may include fever, chills, redness, swelling, or pus drainage from the PICC insertion site.  PICC movement (malposition). The PICC tip may move from its original position due to excessive physical activity, forceful coughing, sneezing, or vomiting.  A break or cut in the PICC. It is important to not use scissors near the PICC.  Nerve or tendon irritation or injury during PICC insertion. WHAT SHOULD I KEEP IN MIND ABOUT ACTIVITIES WHEN I HAVE A PICC?  You may bend your arm and move it freely. If your PICC is near or at the bend of your elbow, avoid activity with repeated motion at the elbow.  Rest at home for the remainder of the day following PICC line insertion.  Avoid lifting heavy objects as instructed by your health care provider.  Avoid using a crutch with the arm on the same side as your PICC. You may need to use a walker. WHAT SHOULD I KNOW ABOUT MY PICC DRESSING?  Keep your PICC bandage (dressing) clean and dry to prevent infection.  Ask your health care provider when you may shower. Ask your health care provider to teach you how to wrap the PICC when you do take a shower.  Change the PICC dressing as instructed by your health care provider.  Change your PICC dressing if it becomes loose or wet. WHAT SHOULD I KNOW ABOUT PICC CARE?  Check the PICC insertion site   daily for leakage, redness, swelling, or pain.  Do not take a bath, swim, or use hot tubs when you have a PICC. Cover PICC line with clear plastic wrap and tape to keep it dry while showering.  Flush the PICC as directed by your health care provider. Let your health care provider know right away if the PICC is difficult to flush or does not flush. Do not use force to flush the PICC.  Do not use a syringe that is less than 10 mL to flush the PICC.  Never pull or tug on the PICC.  Avoid blood pressure checks on the arm with the PICC.  Keep your PICC identification card with you at all  times.  Do not take the PICC out yourself. Only a trained clinical professional should remove the PICC. SEEK IMMEDIATE MEDICAL CARE IF:  Your PICC is accidentally pulled all the way out. If this happens, cover the insertion site with a bandage or gauze dressing. Do not throw the PICC away. Your health care provider will need to inspect it.  Your PICC was tugged or pulled and has partially come out. Do not  push the PICC back in.  There is any type of drainage, redness, or swelling where the PICC enters the skin.  You cannot flush the PICC, it is difficult to flush, or the PICC leaks around the insertion site when it is flushed.  You hear a "flushing" sound when the PICC is flushed.  You have pain, discomfort, or numbness in your arm, shoulder, or jaw on the same side as the PICC.  You feel your heart "racing" or skipping beats.  You notice a hole or tear in the PICC.  You develop chills or a fever. MAKE SURE YOU:   Understand these instructions.  Will watch your condition.  Will get help right away if you are not doing well or get worse. Document Released: 10/29/2002 Document Revised: 09/08/2013 Document Reviewed: 12/30/2012 ExitCare Patient Information 2015 [employer]. This information is not intended to replace advice given to you by your health care provider. Make sure you discuss any questions you have with your health care provider.  

## 2014-01-26 NOTE — Telephone Encounter (Signed)
Todays labs faxed to [facility], per Dr. Abel Presto request.  Call to patient to inform him of labs and fax, as well as to educate patient on neutropenic precautions. Patient expressed understanding knows to call office/cliinic or Dr. Florene Glen for questions or concerns.

## 2014-01-28 ENCOUNTER — Telehealth: Payer: Self-pay | Admitting: Medical Oncology

## 2014-01-28 NOTE — Telephone Encounter (Signed)
Patient called requesting to have his lab/flush appts cancelled for 09/24 and 09/28 states he will be inpatient at [facility]. Appts cancelled per patient request.

## 2014-01-29 ENCOUNTER — Other Ambulatory Visit: Payer: Non-veteran care

## 2014-02-02 ENCOUNTER — Other Ambulatory Visit: Payer: Non-veteran care

## 2014-02-03 ENCOUNTER — Other Ambulatory Visit: Payer: Self-pay

## 2014-02-03 ENCOUNTER — Encounter: Payer: Self-pay | Admitting: *Deleted

## 2014-02-03 DIAGNOSIS — D509 Iron deficiency anemia, unspecified: Secondary | ICD-10-CM

## 2014-02-03 DIAGNOSIS — D500 Iron deficiency anemia secondary to blood loss (chronic): Secondary | ICD-10-CM

## 2014-02-05 ENCOUNTER — Other Ambulatory Visit (HOSPITAL_BASED_OUTPATIENT_CLINIC_OR_DEPARTMENT_OTHER): Payer: Medicare Other

## 2014-02-05 ENCOUNTER — Ambulatory Visit (HOSPITAL_BASED_OUTPATIENT_CLINIC_OR_DEPARTMENT_OTHER): Payer: Medicare Other

## 2014-02-05 DIAGNOSIS — D6481 Anemia due to antineoplastic chemotherapy: Secondary | ICD-10-CM

## 2014-02-05 DIAGNOSIS — T451X5A Adverse effect of antineoplastic and immunosuppressive drugs, initial encounter: Secondary | ICD-10-CM

## 2014-02-05 DIAGNOSIS — D472 Monoclonal gammopathy: Secondary | ICD-10-CM

## 2014-02-05 DIAGNOSIS — Z452 Encounter for adjustment and management of vascular access device: Secondary | ICD-10-CM

## 2014-02-05 DIAGNOSIS — C9200 Acute myeloblastic leukemia, not having achieved remission: Secondary | ICD-10-CM

## 2014-02-05 DIAGNOSIS — D509 Iron deficiency anemia, unspecified: Secondary | ICD-10-CM

## 2014-02-05 DIAGNOSIS — D500 Iron deficiency anemia secondary to blood loss (chronic): Secondary | ICD-10-CM

## 2014-02-05 LAB — CBC WITH DIFFERENTIAL/PLATELET
BASO%: 0 % (ref 0.0–2.0)
Basophils Absolute: 0 10*3/uL (ref 0.0–0.1)
EOS%: 0 % (ref 0.0–7.0)
Eosinophils Absolute: 0 10*3/uL (ref 0.0–0.5)
HCT: 34.1 % — ABNORMAL LOW (ref 38.4–49.9)
HEMOGLOBIN: 11.1 g/dL — AB (ref 13.0–17.1)
LYMPH#: 0.6 10*3/uL — AB (ref 0.9–3.3)
LYMPH%: 65.2 % — ABNORMAL HIGH (ref 14.0–49.0)
MCH: 30.3 pg (ref 27.2–33.4)
MCHC: 32.6 g/dL (ref 32.0–36.0)
MCV: 93.2 fL (ref 79.3–98.0)
MONO#: 0 10*3/uL — AB (ref 0.1–0.9)
MONO%: 1.1 % (ref 0.0–14.0)
NEUT#: 0.3 10*3/uL — CL (ref 1.5–6.5)
NEUT%: 33.7 % — ABNORMAL LOW (ref 39.0–75.0)
NRBC: 0 % (ref 0–0)
Platelets: 118 10*3/uL — ABNORMAL LOW (ref 140–400)
RBC: 3.66 10*6/uL — ABNORMAL LOW (ref 4.20–5.82)
RDW: 16.1 % — AB (ref 11.0–14.6)
WBC: 0.9 10*3/uL — AB (ref 4.0–10.3)

## 2014-02-05 LAB — COMPREHENSIVE METABOLIC PANEL (CC13)
ALBUMIN: 2.2 g/dL — AB (ref 3.5–5.0)
ALK PHOS: 61 U/L (ref 40–150)
ALT: 6 U/L (ref 0–55)
AST: 12 U/L (ref 5–34)
Anion Gap: 7 mEq/L (ref 3–11)
BUN: 12.7 mg/dL (ref 7.0–26.0)
CO2: 29 mEq/L (ref 22–29)
Calcium: 8.2 mg/dL — ABNORMAL LOW (ref 8.4–10.4)
Chloride: 104 mEq/L (ref 98–109)
Creatinine: 0.8 mg/dL (ref 0.7–1.3)
Glucose: 93 mg/dl (ref 70–140)
POTASSIUM: 4.2 meq/L (ref 3.5–5.1)
SODIUM: 140 meq/L (ref 136–145)
TOTAL PROTEIN: 6 g/dL — AB (ref 6.4–8.3)
Total Bilirubin: 0.62 mg/dL (ref 0.20–1.20)

## 2014-02-05 LAB — MAGNESIUM (CC13): Magnesium: 2.1 mg/dl (ref 1.5–2.5)

## 2014-02-05 MED ORDER — SODIUM CHLORIDE 0.9 % IJ SOLN
10.0000 mL | INTRAMUSCULAR | Status: DC | PRN
  Administered 2014-02-05: 10 mL via INTRAVENOUS
  Filled 2014-02-05: qty 10

## 2014-02-05 MED ORDER — HEPARIN SOD (PORK) LOCK FLUSH 100 UNIT/ML IV SOLN
500.0000 [IU] | Freq: Once | INTRAVENOUS | Status: AC
  Administered 2014-02-05: 250 [IU] via INTRAVENOUS
  Filled 2014-02-05: qty 5

## 2014-02-05 NOTE — Patient Instructions (Signed)
PICC Home Guide A peripherally inserted central catheter (PICC) is a long, thin, flexible tube that is inserted into a vein in the upper arm. It is a form of intravenous (IV) access. It is considered to be a "central" line because the tip of the PICC ends in a large vein in your chest. This large vein is called the superior vena cava (SVC). The PICC tip ends in the SVC because there is a lot of blood flow in the SVC. This allows medicines and IV fluids to be quickly distributed throughout the body. The PICC is inserted using a sterile technique by a specially trained nurse or physician. After the PICC is inserted, a chest X-ray exam is done to be sure it is in the correct place.  A PICC may be placed for different reasons, such as:  To give medicines and liquid nutrition that can only be given through a central line. Examples are:  Certain antibiotic treatments.  Chemotherapy.  Total parenteral nutrition (TPN).  To take frequent blood samples.  To give IV fluids and blood products.  If there is difficulty placing a peripheral intravenous (PIV) catheter. If taken care of properly, a PICC can remain in place for several months. A PICC can also allow a person to go home from the hospital early. Medicine and PICC care can be managed at home by a family member or home health care team. WHAT PROBLEMS CAN HAPPEN WHEN I HAVE A PICC? Problems with a PICC can occasionally occur. These may include the following:  A blood clot (thrombus) forming in or at the tip of the PICC. This can cause the PICC to become clogged. A clot-dissolving medicine called tissue plasminogen activator (tPA) can be given through the PICC to help break up the clot.  Inflammation of the vein (phlebitis) in which the PICC is placed. Signs of inflammation may include redness, pain at the insertion site, red streaks, or being able to feel a "cord" in the vein where the PICC is located.  Infection in the PICC or at the insertion  site. Signs of infection may include fever, chills, redness, swelling, or pus drainage from the PICC insertion site.  PICC movement (malposition). The PICC tip may move from its original position due to excessive physical activity, forceful coughing, sneezing, or vomiting.  A break or cut in the PICC. It is important to not use scissors near the PICC.  Nerve or tendon irritation or injury during PICC insertion. WHAT SHOULD I KEEP IN MIND ABOUT ACTIVITIES WHEN I HAVE A PICC?  You may bend your arm and move it freely. If your PICC is near or at the bend of your elbow, avoid activity with repeated motion at the elbow.  Rest at home for the remainder of the day following PICC line insertion.  Avoid lifting heavy objects as instructed by your health care provider.  Avoid using a crutch with the arm on the same side as your PICC. You may need to use a walker. WHAT SHOULD I KNOW ABOUT MY PICC DRESSING?  Keep your PICC bandage (dressing) clean and dry to prevent infection.  Ask your health care provider when you may shower. Ask your health care provider to teach you how to wrap the PICC when you do take a shower.  Change the PICC dressing as instructed by your health care provider.  Change your PICC dressing if it becomes loose or wet. WHAT SHOULD I KNOW ABOUT PICC CARE?  Check the PICC insertion site   daily for leakage, redness, swelling, or pain.  Do not take a bath, swim, or use hot tubs when you have a PICC. Cover PICC line with clear plastic wrap and tape to keep it dry while showering.  Flush the PICC as directed by your health care provider. Let your health care provider know right away if the PICC is difficult to flush or does not flush. Do not use force to flush the PICC.  Do not use a syringe that is less than 10 mL to flush the PICC.  Never pull or tug on the PICC.  Avoid blood pressure checks on the arm with the PICC.  Keep your PICC identification card with you at all  times.  Do not take the PICC out yourself. Only a trained clinical professional should remove the PICC. SEEK IMMEDIATE MEDICAL CARE IF:  Your PICC is accidentally pulled all the way out. If this happens, cover the insertion site with a bandage or gauze dressing. Do not throw the PICC away. Your health care provider will need to inspect it.  Your PICC was tugged or pulled and has partially come out. Do not  push the PICC back in.  There is any type of drainage, redness, or swelling where the PICC enters the skin.  You cannot flush the PICC, it is difficult to flush, or the PICC leaks around the insertion site when it is flushed.  You hear a "flushing" sound when the PICC is flushed.  You have pain, discomfort, or numbness in your arm, shoulder, or jaw on the same side as the PICC.  You feel your heart "racing" or skipping beats.  You notice a hole or tear in the PICC.  You develop chills or a fever. MAKE SURE YOU:   Understand these instructions.  Will watch your condition.  Will get help right away if you are not doing well or get worse. Document Released: 10/29/2002 Document Revised: 09/08/2013 Document Reviewed: 12/30/2012 ExitCare Patient Information 2015 [employer]. This information is not intended to replace advice given to you by your health care provider. Make sure you discuss any questions you have with your health care provider.  

## 2014-02-06 ENCOUNTER — Other Ambulatory Visit: Payer: Self-pay | Admitting: Medical Oncology

## 2014-02-06 DIAGNOSIS — D72819 Decreased white blood cell count, unspecified: Secondary | ICD-10-CM

## 2014-02-06 NOTE — Progress Notes (Signed)
Late entry: labs faxed yesterday to [facility] as per their request, @ [phone].  Patient request for lab appts needed, per [facility],  to be scheduled for the month of October, every  Monday and Thursday. POF sent to schedulers. Scheduling to contact patient with appts.  Lab orders placed.

## 2014-02-09 ENCOUNTER — Telehealth: Payer: Self-pay | Admitting: Medical Oncology

## 2014-02-09 ENCOUNTER — Ambulatory Visit (HOSPITAL_BASED_OUTPATIENT_CLINIC_OR_DEPARTMENT_OTHER): Payer: Medicare Other

## 2014-02-09 ENCOUNTER — Other Ambulatory Visit (HOSPITAL_BASED_OUTPATIENT_CLINIC_OR_DEPARTMENT_OTHER): Payer: Medicare Other

## 2014-02-09 VITALS — BP 143/74 | HR 68 | Temp 97.9°F

## 2014-02-09 DIAGNOSIS — Z452 Encounter for adjustment and management of vascular access device: Secondary | ICD-10-CM

## 2014-02-09 DIAGNOSIS — D509 Iron deficiency anemia, unspecified: Secondary | ICD-10-CM

## 2014-02-09 DIAGNOSIS — D500 Iron deficiency anemia secondary to blood loss (chronic): Secondary | ICD-10-CM

## 2014-02-09 LAB — CBC WITH DIFFERENTIAL/PLATELET
BASO%: 0 % (ref 0.0–2.0)
BASOS ABS: 0 10*3/uL (ref 0.0–0.1)
EOS ABS: 0 10*3/uL (ref 0.0–0.5)
EOS%: 0 % (ref 0.0–7.0)
HCT: 31.2 % — ABNORMAL LOW (ref 38.4–49.9)
HEMOGLOBIN: 10.2 g/dL — AB (ref 13.0–17.1)
LYMPH%: 68.2 % — ABNORMAL HIGH (ref 14.0–49.0)
MCH: 30.3 pg (ref 27.2–33.4)
MCHC: 32.7 g/dL (ref 32.0–36.0)
MCV: 92.6 fL (ref 79.3–98.0)
MONO#: 0 10*3/uL — ABNORMAL LOW (ref 0.1–0.9)
MONO%: 0 % (ref 0.0–14.0)
NEUT#: 0.2 10*3/uL — CL (ref 1.5–6.5)
NEUT%: 31.8 % — AB (ref 39.0–75.0)
NRBC: 0 % (ref 0–0)
PLATELETS: 46 10*3/uL — AB (ref 140–400)
RBC: 3.37 10*6/uL — ABNORMAL LOW (ref 4.20–5.82)
RDW: 15.8 % — ABNORMAL HIGH (ref 11.0–14.6)
WBC: 0.7 10*3/uL — AB (ref 4.0–10.3)
lymph#: 0.5 10*3/uL — ABNORMAL LOW (ref 0.9–3.3)

## 2014-02-09 LAB — COMPREHENSIVE METABOLIC PANEL (CC13)
ALT: 9 U/L (ref 0–55)
ANION GAP: 9 meq/L (ref 3–11)
AST: 9 U/L (ref 5–34)
Albumin: 2.2 g/dL — ABNORMAL LOW (ref 3.5–5.0)
Alkaline Phosphatase: 66 U/L (ref 40–150)
BILIRUBIN TOTAL: 0.67 mg/dL (ref 0.20–1.20)
BUN: 8 mg/dL (ref 7.0–26.0)
CALCIUM: 8.4 mg/dL (ref 8.4–10.4)
CHLORIDE: 108 meq/L (ref 98–109)
CO2: 25 meq/L (ref 22–29)
CREATININE: 0.8 mg/dL (ref 0.7–1.3)
GLUCOSE: 98 mg/dL (ref 70–140)
Potassium: 3.6 mEq/L (ref 3.5–5.1)
Sodium: 142 mEq/L (ref 136–145)
Total Protein: 5.9 g/dL — ABNORMAL LOW (ref 6.4–8.3)

## 2014-02-09 LAB — MAGNESIUM (CC13): MAGNESIUM: 2.1 mg/dL (ref 1.5–2.5)

## 2014-02-09 MED ORDER — SODIUM CHLORIDE 0.9 % IJ SOLN
10.0000 mL | INTRAMUSCULAR | Status: DC | PRN
  Administered 2014-02-09: 10 mL via INTRAVENOUS
  Filled 2014-02-09: qty 10

## 2014-02-09 MED ORDER — HEPARIN SOD (PORK) LOCK FLUSH 100 UNIT/ML IV SOLN
500.0000 [IU] | Freq: Once | INTRAVENOUS | Status: AC
  Administered 2014-02-09: 250 [IU] via INTRAVENOUS
  Filled 2014-02-09: qty 5

## 2014-02-09 NOTE — Patient Instructions (Signed)
PICC Home Guide A peripherally inserted central catheter (PICC) is a long, thin, flexible tube that is inserted into a vein in the upper arm. It is a form of intravenous (IV) access. It is considered to be a "central" line because the tip of the PICC ends in a large vein in your chest. This large vein is called the superior vena cava (SVC). The PICC tip ends in the SVC because there is a lot of blood flow in the SVC. This allows medicines and IV fluids to be quickly distributed throughout the body. The PICC is inserted using a sterile technique by a specially trained nurse or physician. After the PICC is inserted, a chest X-ray exam is done to be sure it is in the correct place.  A PICC may be placed for different reasons, such as:  To give medicines and liquid nutrition that can only be given through a central line. Examples are:  Certain antibiotic treatments.  Chemotherapy.  Total parenteral nutrition (TPN).  To take frequent blood samples.  To give IV fluids and blood products.  If there is difficulty placing a peripheral intravenous (PIV) catheter. If taken care of properly, a PICC can remain in place for several months. A PICC can also allow a person to go home from the hospital early. Medicine and PICC care can be managed at home by a family member or home health care team. WHAT PROBLEMS CAN HAPPEN WHEN I HAVE A PICC? Problems with a PICC can occasionally occur. These may include the following:  A blood clot (thrombus) forming in or at the tip of the PICC. This can cause the PICC to become clogged. A clot-dissolving medicine called tissue plasminogen activator (tPA) can be given through the PICC to help break up the clot.  Inflammation of the vein (phlebitis) in which the PICC is placed. Signs of inflammation may include redness, pain at the insertion site, red streaks, or being able to feel a "cord" in the vein where the PICC is located.  Infection in the PICC or at the insertion  site. Signs of infection may include fever, chills, redness, swelling, or pus drainage from the PICC insertion site.  PICC movement (malposition). The PICC tip may move from its original position due to excessive physical activity, forceful coughing, sneezing, or vomiting.  A break or cut in the PICC. It is important to not use scissors near the PICC.  Nerve or tendon irritation or injury during PICC insertion. WHAT SHOULD I KEEP IN MIND ABOUT ACTIVITIES WHEN I HAVE A PICC?  You may bend your arm and move it freely. If your PICC is near or at the bend of your elbow, avoid activity with repeated motion at the elbow.  Rest at home for the remainder of the day following PICC line insertion.  Avoid lifting heavy objects as instructed by your health care provider.  Avoid using a crutch with the arm on the same side as your PICC. You may need to use a walker. WHAT SHOULD I KNOW ABOUT MY PICC DRESSING?  Keep your PICC bandage (dressing) clean and dry to prevent infection.  Ask your health care provider when you may shower. Ask your health care provider to teach you how to wrap the PICC when you do take a shower.  Change the PICC dressing as instructed by your health care provider.  Change your PICC dressing if it becomes loose or wet. WHAT SHOULD I KNOW ABOUT PICC CARE?  Check the PICC insertion site   daily for leakage, redness, swelling, or pain.  Do not take a bath, swim, or use hot tubs when you have a PICC. Cover PICC line with clear plastic wrap and tape to keep it dry while showering.  Flush the PICC as directed by your health care provider. Let your health care provider know right away if the PICC is difficult to flush or does not flush. Do not use force to flush the PICC.  Do not use a syringe that is less than 10 mL to flush the PICC.  Never pull or tug on the PICC.  Avoid blood pressure checks on the arm with the PICC.  Keep your PICC identification card with you at all  times.  Do not take the PICC out yourself. Only a trained clinical professional should remove the PICC. SEEK IMMEDIATE MEDICAL CARE IF:  Your PICC is accidentally pulled all the way out. If this happens, cover the insertion site with a bandage or gauze dressing. Do not throw the PICC away. Your health care provider will need to inspect it.  Your PICC was tugged or pulled and has partially come out. Do not  push the PICC back in.  There is any type of drainage, redness, or swelling where the PICC enters the skin.  You cannot flush the PICC, it is difficult to flush, or the PICC leaks around the insertion site when it is flushed.  You hear a "flushing" sound when the PICC is flushed.  You have pain, discomfort, or numbness in your arm, shoulder, or jaw on the same side as the PICC.  You feel your heart "racing" or skipping beats.  You notice a hole or tear in the PICC.  You develop chills or a fever. MAKE SURE YOU:   Understand these instructions.  Will watch your condition.  Will get help right away if you are not doing well or get worse. Document Released: 10/29/2002 Document Revised: 09/08/2013 Document Reviewed: 12/30/2012 ExitCare Patient Information 2015 [employer]. This information is not intended to replace advice given to you by your health care provider. Make sure you discuss any questions you have with your health care provider.  

## 2014-02-09 NOTE — Telephone Encounter (Signed)
Today's lab (CBC, CMP, Mg) results faxed to [phone] @ [facility],  per patient's request and [facility].

## 2014-02-10 ENCOUNTER — Telehealth: Payer: Self-pay | Admitting: Oncology

## 2014-02-10 NOTE — Telephone Encounter (Signed)
added additional lab appts for qmon and thurs per 10/2 pof. s/w pt he is aware and will get new schedule 10/8.

## 2014-02-12 ENCOUNTER — Telehealth: Payer: Self-pay | Admitting: Medical Oncology

## 2014-02-12 ENCOUNTER — Other Ambulatory Visit (HOSPITAL_BASED_OUTPATIENT_CLINIC_OR_DEPARTMENT_OTHER): Payer: Medicare Other

## 2014-02-12 ENCOUNTER — Ambulatory Visit: Payer: Non-veteran care

## 2014-02-12 VITALS — BP 119/62 | HR 69 | Temp 98.8°F | Resp 20

## 2014-02-12 DIAGNOSIS — C9200 Acute myeloblastic leukemia, not having achieved remission: Secondary | ICD-10-CM

## 2014-02-12 DIAGNOSIS — Z452 Encounter for adjustment and management of vascular access device: Secondary | ICD-10-CM

## 2014-02-12 DIAGNOSIS — D500 Iron deficiency anemia secondary to blood loss (chronic): Secondary | ICD-10-CM

## 2014-02-12 LAB — CBC WITH DIFFERENTIAL/PLATELET
BASO%: 0 % (ref 0.0–2.0)
Basophils Absolute: 0 10*3/uL (ref 0.0–0.1)
EOS ABS: 0 10*3/uL (ref 0.0–0.5)
EOS%: 0 % (ref 0.0–7.0)
HCT: 27.8 % — ABNORMAL LOW (ref 38.4–49.9)
HGB: 9.2 g/dL — ABNORMAL LOW (ref 13.0–17.1)
LYMPH#: 0.3 10*3/uL — AB (ref 0.9–3.3)
LYMPH%: 82.1 % — ABNORMAL HIGH (ref 14.0–49.0)
MCH: 30.5 pg (ref 27.2–33.4)
MCHC: 33.1 g/dL (ref 32.0–36.0)
MCV: 92.1 fL (ref 79.3–98.0)
MONO#: 0 10*3/uL — ABNORMAL LOW (ref 0.1–0.9)
MONO%: 0 % (ref 0.0–14.0)
NEUT%: 17.9 % — ABNORMAL LOW (ref 39.0–75.0)
NEUTROS ABS: 0.1 10*3/uL — AB (ref 1.5–6.5)
Platelets: 26 10*3/uL — ABNORMAL LOW (ref 140–400)
RBC: 3.02 10*6/uL — ABNORMAL LOW (ref 4.20–5.82)
RDW: 15.6 % — AB (ref 11.0–14.6)
WBC: 0.4 10*3/uL — CL (ref 4.0–10.3)

## 2014-02-12 MED ORDER — HEPARIN SOD (PORK) LOCK FLUSH 100 UNIT/ML IV SOLN
500.0000 [IU] | Freq: Once | INTRAVENOUS | Status: AC
  Administered 2014-02-12: 250 [IU] via INTRAVENOUS
  Filled 2014-02-12: qty 5

## 2014-02-12 MED ORDER — SODIUM CHLORIDE 0.9 % IJ SOLN
10.0000 mL | INTRAMUSCULAR | Status: DC | PRN
  Administered 2014-02-12: 10 mL via INTRAVENOUS
  Filled 2014-02-12: qty 10

## 2014-02-12 NOTE — Telephone Encounter (Signed)
Today's lab results faxed to [facility] attention to Dr. Jerrye Noble @ [phone] per patient's and Dr. Abel Presto request.

## 2014-02-12 NOTE — Patient Instructions (Signed)
PICC Home Guide A peripherally inserted central catheter (PICC) is a long, thin, flexible tube that is inserted into a vein in the upper arm. It is a form of intravenous (IV) access. It is considered to be a "central" line because the tip of the PICC ends in a large vein in your chest. This large vein is called the superior vena cava (SVC). The PICC tip ends in the SVC because there is a lot of blood flow in the SVC. This allows medicines and IV fluids to be quickly distributed throughout the body. The PICC is inserted using a sterile technique by a specially trained nurse or physician. After the PICC is inserted, a chest X-ray exam is done to be sure it is in the correct place.  A PICC may be placed for different reasons, such as:  To give medicines and liquid nutrition that can only be given through a central line. Examples are:  Certain antibiotic treatments.  Chemotherapy.  Total parenteral nutrition (TPN).  To take frequent blood samples.  To give IV fluids and blood products.  If there is difficulty placing a peripheral intravenous (PIV) catheter. If taken care of properly, a PICC can remain in place for several months. A PICC can also allow a person to go home from the hospital early. Medicine and PICC care can be managed at home by a family member or home health care team. WHAT PROBLEMS CAN HAPPEN WHEN I HAVE A PICC? Problems with a PICC can occasionally occur. These may include the following:  A blood clot (thrombus) forming in or at the tip of the PICC. This can cause the PICC to become clogged. A clot-dissolving medicine called tissue plasminogen activator (tPA) can be given through the PICC to help break up the clot.  Inflammation of the vein (phlebitis) in which the PICC is placed. Signs of inflammation may include redness, pain at the insertion site, red streaks, or being able to feel a "cord" in the vein where the PICC is located.  Infection in the PICC or at the insertion  site. Signs of infection may include fever, chills, redness, swelling, or pus drainage from the PICC insertion site.  PICC movement (malposition). The PICC tip may move from its original position due to excessive physical activity, forceful coughing, sneezing, or vomiting.  A break or cut in the PICC. It is important to not use scissors near the PICC.  Nerve or tendon irritation or injury during PICC insertion. WHAT SHOULD I KEEP IN MIND ABOUT ACTIVITIES WHEN I HAVE A PICC?  You may bend your arm and move it freely. If your PICC is near or at the bend of your elbow, avoid activity with repeated motion at the elbow.  Rest at home for the remainder of the day following PICC line insertion.  Avoid lifting heavy objects as instructed by your health care provider.  Avoid using a crutch with the arm on the same side as your PICC. You may need to use a walker. WHAT SHOULD I KNOW ABOUT MY PICC DRESSING?  Keep your PICC bandage (dressing) clean and dry to prevent infection.  Ask your health care provider when you may shower. Ask your health care provider to teach you how to wrap the PICC when you do take a shower.  Change the PICC dressing as instructed by your health care provider.  Change your PICC dressing if it becomes loose or wet. WHAT SHOULD I KNOW ABOUT PICC CARE?  Check the PICC insertion site   daily for leakage, redness, swelling, or pain.  Do not take a bath, swim, or use hot tubs when you have a PICC. Cover PICC line with clear plastic wrap and tape to keep it dry while showering.  Flush the PICC as directed by your health care provider. Let your health care provider know right away if the PICC is difficult to flush or does not flush. Do not use force to flush the PICC.  Do not use a syringe that is less than 10 mL to flush the PICC.  Never pull or tug on the PICC.  Avoid blood pressure checks on the arm with the PICC.  Keep your PICC identification card with you at all  times.  Do not take the PICC out yourself. Only a trained clinical professional should remove the PICC. SEEK IMMEDIATE MEDICAL CARE IF:  Your PICC is accidentally pulled all the way out. If this happens, cover the insertion site with a bandage or gauze dressing. Do not throw the PICC away. Your health care provider will need to inspect it.  Your PICC was tugged or pulled and has partially come out. Do not  push the PICC back in.  There is any type of drainage, redness, or swelling where the PICC enters the skin.  You cannot flush the PICC, it is difficult to flush, or the PICC leaks around the insertion site when it is flushed.  You hear a "flushing" sound when the PICC is flushed.  You have pain, discomfort, or numbness in your arm, shoulder, or jaw on the same side as the PICC.  You feel your heart "racing" or skipping beats.  You notice a hole or tear in the PICC.  You develop chills or a fever. MAKE SURE YOU:   Understand these instructions.  Will watch your condition.  Will get help right away if you are not doing well or get worse. Document Released: 10/29/2002 Document Revised: 09/08/2013 Document Reviewed: 12/30/2012 ExitCare Patient Information 2015 [employer]. This information is not intended to replace advice given to you by your health care provider. Make sure you discuss any questions you have with your health care provider.  

## 2014-02-16 ENCOUNTER — Ambulatory Visit (HOSPITAL_COMMUNITY)
Admission: RE | Admit: 2014-02-16 | Discharge: 2014-02-16 | Disposition: A | Discharge status: Home / Self Care | Payer: Medicare Other | Source: Ambulatory Visit | Attending: Oncology | Admitting: Oncology

## 2014-02-16 ENCOUNTER — Other Ambulatory Visit (HOSPITAL_BASED_OUTPATIENT_CLINIC_OR_DEPARTMENT_OTHER): Payer: Medicare Other

## 2014-02-16 ENCOUNTER — Ambulatory Visit: Payer: Medicare Other

## 2014-02-16 ENCOUNTER — Other Ambulatory Visit: Payer: Self-pay | Admitting: Medical Oncology

## 2014-02-16 DIAGNOSIS — C9200 Acute myeloblastic leukemia, not having achieved remission: Secondary | ICD-10-CM

## 2014-02-16 DIAGNOSIS — T451X5A Adverse effect of antineoplastic and immunosuppressive drugs, initial encounter: Secondary | ICD-10-CM | POA: Diagnosis not present

## 2014-02-16 DIAGNOSIS — D6481 Anemia due to antineoplastic chemotherapy: Secondary | ICD-10-CM | POA: Diagnosis not present

## 2014-02-16 DIAGNOSIS — D509 Iron deficiency anemia, unspecified: Secondary | ICD-10-CM

## 2014-02-16 DIAGNOSIS — D500 Iron deficiency anemia secondary to blood loss (chronic): Secondary | ICD-10-CM

## 2014-02-16 DIAGNOSIS — Z452 Encounter for adjustment and management of vascular access device: Secondary | ICD-10-CM

## 2014-02-16 LAB — CBC WITH DIFFERENTIAL/PLATELET
BASO%: 0 % (ref 0.0–2.0)
Basophils Absolute: 0 10*3/uL (ref 0.0–0.1)
EOS ABS: 0 10*3/uL (ref 0.0–0.5)
EOS%: 0 % (ref 0.0–7.0)
HCT: 21.6 % — ABNORMAL LOW (ref 38.4–49.9)
HGB: 7.1 g/dL — ABNORMAL LOW (ref 13.0–17.1)
LYMPH%: 93.1 % — ABNORMAL HIGH (ref 14.0–49.0)
MCH: 29.8 pg (ref 27.2–33.4)
MCHC: 32.9 g/dL (ref 32.0–36.0)
MCV: 90.8 fL (ref 79.3–98.0)
MONO#: 0 10*3/uL — AB (ref 0.1–0.9)
MONO%: 0 % (ref 0.0–14.0)
NEUT%: 6.9 % — ABNORMAL LOW (ref 39.0–75.0)
NEUTROS ABS: 0 10*3/uL — AB (ref 1.5–6.5)
Platelets: 24 10*3/uL — ABNORMAL LOW (ref 140–400)
RBC: 2.38 10*6/uL — ABNORMAL LOW (ref 4.20–5.82)
RDW: 15.6 % — AB (ref 11.0–14.6)
WBC: 0.3 10*3/uL — AB (ref 4.0–10.3)
lymph#: 0.3 10*3/uL — ABNORMAL LOW (ref 0.9–3.3)
nRBC: 0 % (ref 0–0)

## 2014-02-16 LAB — COMPREHENSIVE METABOLIC PANEL (CC13)
ALK PHOS: 66 U/L (ref 40–150)
ALT: 8 U/L (ref 0–55)
AST: 7 U/L (ref 5–34)
Albumin: 2.2 g/dL — ABNORMAL LOW (ref 3.5–5.0)
Anion Gap: 9 mEq/L (ref 3–11)
BILIRUBIN TOTAL: 1.05 mg/dL (ref 0.20–1.20)
BUN: 9.1 mg/dL (ref 7.0–26.0)
CO2: 28 mEq/L (ref 22–29)
Calcium: 8.5 mg/dL (ref 8.4–10.4)
Chloride: 104 mEq/L (ref 98–109)
Creatinine: 0.8 mg/dL (ref 0.7–1.3)
GLUCOSE: 112 mg/dL (ref 70–140)
Potassium: 3.4 mEq/L — ABNORMAL LOW (ref 3.5–5.1)
SODIUM: 140 meq/L (ref 136–145)
Total Protein: 6 g/dL — ABNORMAL LOW (ref 6.4–8.3)

## 2014-02-16 LAB — HOLD TUBE, BLOOD BANK

## 2014-02-16 LAB — MAGNESIUM (CC13): Magnesium: 2 mg/dl (ref 1.5–2.5)

## 2014-02-16 MED ORDER — SODIUM CHLORIDE 0.9 % IV SOLN
250.0000 mL | Freq: Once | INTRAVENOUS | Status: DC
  Filled 2014-02-16: qty 250

## 2014-02-16 MED ORDER — SODIUM CHLORIDE 0.9 % IJ SOLN
10.0000 mL | INTRAMUSCULAR | Status: DC | PRN
  Administered 2014-02-16: 10 mL via INTRAVENOUS
  Filled 2014-02-16: qty 10

## 2014-02-16 MED ORDER — HEPARIN SOD (PORK) LOCK FLUSH 100 UNIT/ML IV SOLN
500.0000 [IU] | Freq: Once | INTRAVENOUS | Status: AC
  Administered 2014-02-16: 250 [IU] via INTRAVENOUS
  Filled 2014-02-16: qty 5

## 2014-02-16 NOTE — Progress Notes (Signed)
Patients lab resulted with hgb @ 7.2 and neutropenic (Neut 0.0). Patient scheduled for 2 units of blood for tomorrow at 0730 (per [facility] protocol). Patient and spouse educated on neutropenic precautions and is wearing his face mask for protection . Patient gave verbal understanding of neutropenic precaution understanding.   Lab results faxed to [facility] per their request.

## 2014-02-16 NOTE — Patient Instructions (Signed)
PICC Home Guide A peripherally inserted central catheter (PICC) is a long, thin, flexible tube that is inserted into a vein in the upper arm. It is a form of intravenous (IV) access. It is considered to be a "central" line because the tip of the PICC ends in a large vein in your chest. This large vein is called the superior vena cava (SVC). The PICC tip ends in the SVC because there is a lot of blood flow in the SVC. This allows medicines and IV fluids to be quickly distributed throughout the body. The PICC is inserted using a sterile technique by a specially trained nurse or physician. After the PICC is inserted, a chest X-ray exam is done to be sure it is in the correct place.  A PICC may be placed for different reasons, such as:  To give medicines and liquid nutrition that can only be given through a central line. Examples are:  Certain antibiotic treatments.  Chemotherapy.  Total parenteral nutrition (TPN).  To take frequent blood samples.  To give IV fluids and blood products.  If there is difficulty placing a peripheral intravenous (PIV) catheter. If taken care of properly, a PICC can remain in place for several months. A PICC can also allow a person to go home from the hospital early. Medicine and PICC care can be managed at home by a family member or home health care team. WHAT PROBLEMS CAN HAPPEN WHEN I HAVE A PICC? Problems with a PICC can occasionally occur. These may include the following:  A blood clot (thrombus) forming in or at the tip of the PICC. This can cause the PICC to become clogged. A clot-dissolving medicine called tissue plasminogen activator (tPA) can be given through the PICC to help break up the clot.  Inflammation of the vein (phlebitis) in which the PICC is placed. Signs of inflammation may include redness, pain at the insertion site, red streaks, or being able to feel a "cord" in the vein where the PICC is located.  Infection in the PICC or at the insertion  site. Signs of infection may include fever, chills, redness, swelling, or pus drainage from the PICC insertion site.  PICC movement (malposition). The PICC tip may move from its original position due to excessive physical activity, forceful coughing, sneezing, or vomiting.  A break or cut in the PICC. It is important to not use scissors near the PICC.  Nerve or tendon irritation or injury during PICC insertion. WHAT SHOULD I KEEP IN MIND ABOUT ACTIVITIES WHEN I HAVE A PICC?  You may bend your arm and move it freely. If your PICC is near or at the bend of your elbow, avoid activity with repeated motion at the elbow.  Rest at home for the remainder of the day following PICC line insertion.  Avoid lifting heavy objects as instructed by your health care provider.  Avoid using a crutch with the arm on the same side as your PICC. You may need to use a walker. WHAT SHOULD I KNOW ABOUT MY PICC DRESSING?  Keep your PICC bandage (dressing) clean and dry to prevent infection.  Ask your health care provider when you may shower. Ask your health care provider to teach you how to wrap the PICC when you do take a shower.  Change the PICC dressing as instructed by your health care provider.  Change your PICC dressing if it becomes loose or wet. WHAT SHOULD I KNOW ABOUT PICC CARE?  Check the PICC insertion site   daily for leakage, redness, swelling, or pain.  Do not take a bath, swim, or use hot tubs when you have a PICC. Cover PICC line with clear plastic wrap and tape to keep it dry while showering.  Flush the PICC as directed by your health care provider. Let your health care provider know right away if the PICC is difficult to flush or does not flush. Do not use force to flush the PICC.  Do not use a syringe that is less than 10 mL to flush the PICC.  Never pull or tug on the PICC.  Avoid blood pressure checks on the arm with the PICC.  Keep your PICC identification card with you at all  times.  Do not take the PICC out yourself. Only a trained clinical professional should remove the PICC. SEEK IMMEDIATE MEDICAL CARE IF:  Your PICC is accidentally pulled all the way out. If this happens, cover the insertion site with a bandage or gauze dressing. Do not throw the PICC away. Your health care provider will need to inspect it.  Your PICC was tugged or pulled and has partially come out. Do not  push the PICC back in.  There is any type of drainage, redness, or swelling where the PICC enters the skin.  You cannot flush the PICC, it is difficult to flush, or the PICC leaks around the insertion site when it is flushed.  You hear a "flushing" sound when the PICC is flushed.  You have pain, discomfort, or numbness in your arm, shoulder, or jaw on the same side as the PICC.  You feel your heart "racing" or skipping beats.  You notice a hole or tear in the PICC.  You develop chills or a fever. MAKE SURE YOU:   Understand these instructions.  Will watch your condition.  Will get help right away if you are not doing well or get worse. Document Released: 10/29/2002 Document Revised: 09/08/2013 Document Reviewed: 12/30/2012 ExitCare Patient Information 2015 [employer]. This information is not intended to replace advice given to you by your health care provider. Make sure you discuss any questions you have with your health care provider.  

## 2014-02-17 ENCOUNTER — Ambulatory Visit (HOSPITAL_BASED_OUTPATIENT_CLINIC_OR_DEPARTMENT_OTHER): Payer: Medicare Other

## 2014-02-17 VITALS — BP 145/73 | HR 63 | Temp 98.3°F | Resp 17

## 2014-02-17 DIAGNOSIS — D509 Iron deficiency anemia, unspecified: Secondary | ICD-10-CM

## 2014-02-17 DIAGNOSIS — D6481 Anemia due to antineoplastic chemotherapy: Secondary | ICD-10-CM | POA: Diagnosis not present

## 2014-02-17 DIAGNOSIS — D649 Anemia, unspecified: Secondary | ICD-10-CM

## 2014-02-17 DIAGNOSIS — T451X5A Adverse effect of antineoplastic and immunosuppressive drugs, initial encounter: Principal | ICD-10-CM

## 2014-02-17 LAB — PREPARE RBC (CROSSMATCH)

## 2014-02-17 MED ORDER — SODIUM CHLORIDE 0.9 % IV SOLN
Freq: Once | INTRAVENOUS | Status: AC
  Administered 2014-02-17: 08:00:00 via INTRAVENOUS

## 2014-02-17 MED ORDER — DIPHENHYDRAMINE HCL 25 MG PO CAPS
ORAL_CAPSULE | ORAL | Status: AC
  Filled 2014-02-17: qty 1

## 2014-02-17 MED ORDER — ACETAMINOPHEN 325 MG PO TABS
ORAL_TABLET | ORAL | Status: AC
  Filled 2014-02-17: qty 2

## 2014-02-17 MED ORDER — SODIUM CHLORIDE 0.9 % IJ SOLN
10.0000 mL | INTRAMUSCULAR | Status: AC | PRN
  Administered 2014-02-17: 3 mL
  Filled 2014-02-17: qty 10

## 2014-02-17 MED ORDER — DIPHENHYDRAMINE HCL 25 MG PO CAPS
25.0000 mg | ORAL_CAPSULE | Freq: Once | ORAL | Status: AC
  Administered 2014-02-17: 25 mg via ORAL

## 2014-02-17 MED ORDER — HEPARIN SOD (PORK) LOCK FLUSH 100 UNIT/ML IV SOLN
500.0000 [IU] | Freq: Every day | INTRAVENOUS | Status: AC | PRN
  Administered 2014-02-17: 250 [IU]
  Filled 2014-02-17: qty 5

## 2014-02-17 MED ORDER — ACETAMINOPHEN 325 MG PO TABS
650.0000 mg | ORAL_TABLET | Freq: Once | ORAL | Status: AC
  Administered 2014-02-17: 650 mg via ORAL

## 2014-02-17 MED ORDER — PROCHLORPERAZINE MALEATE 10 MG PO TABS
ORAL_TABLET | ORAL | Status: AC
  Filled 2014-02-17: qty 1

## 2014-02-17 MED ORDER — PROCHLORPERAZINE MALEATE 10 MG PO TABS
5.0000 mg | ORAL_TABLET | Freq: Once | ORAL | Status: AC
  Administered 2014-02-17: 5 mg via ORAL

## 2014-02-17 NOTE — Progress Notes (Signed)
1025-patient c/o nausea, has this nausea most times in AM, forgot nausea medication. Per Dr. Alen Blew, okay to give compazine 5 mg po x 1 dose while here.

## 2014-02-17 NOTE — Patient Instructions (Signed)

## 2014-02-18 LAB — TYPE AND SCREEN
ABO/RH(D): O POS
Antibody Screen: NEGATIVE
Unit division: 0
Unit division: 0

## 2014-02-19 ENCOUNTER — Ambulatory Visit: Payer: Medicare Other

## 2014-02-19 ENCOUNTER — Telehealth: Payer: Self-pay | Admitting: Medical Oncology

## 2014-02-19 ENCOUNTER — Telehealth: Payer: Self-pay | Admitting: Oncology

## 2014-02-19 ENCOUNTER — Other Ambulatory Visit (HOSPITAL_BASED_OUTPATIENT_CLINIC_OR_DEPARTMENT_OTHER): Payer: Medicare Other

## 2014-02-19 ENCOUNTER — Other Ambulatory Visit: Payer: Non-veteran care

## 2014-02-19 DIAGNOSIS — D509 Iron deficiency anemia, unspecified: Secondary | ICD-10-CM

## 2014-02-19 DIAGNOSIS — Z452 Encounter for adjustment and management of vascular access device: Secondary | ICD-10-CM

## 2014-02-19 DIAGNOSIS — D649 Anemia, unspecified: Secondary | ICD-10-CM

## 2014-02-19 DIAGNOSIS — D72819 Decreased white blood cell count, unspecified: Secondary | ICD-10-CM

## 2014-02-19 LAB — CBC WITH DIFFERENTIAL/PLATELET
BASO%: 0 % (ref 0.0–2.0)
Basophils Absolute: 0 10*3/uL (ref 0.0–0.1)
EOS%: 2.9 % (ref 0.0–7.0)
Eosinophils Absolute: 0 10*3/uL (ref 0.0–0.5)
HEMATOCRIT: 29.7 % — AB (ref 38.4–49.9)
HGB: 9.8 g/dL — ABNORMAL LOW (ref 13.0–17.1)
LYMPH%: 91.2 % — AB (ref 14.0–49.0)
MCH: 29.5 pg (ref 27.2–33.4)
MCHC: 33 g/dL (ref 32.0–36.0)
MCV: 89.5 fL (ref 79.3–98.0)
MONO#: 0 10*3/uL — ABNORMAL LOW (ref 0.1–0.9)
MONO%: 0 % (ref 0.0–14.0)
NEUT%: 5.9 % — AB (ref 39.0–75.0)
NEUTROS ABS: 0 10*3/uL — AB (ref 1.5–6.5)
PLATELETS: 46 10*3/uL — AB (ref 140–400)
RBC: 3.32 10*6/uL — ABNORMAL LOW (ref 4.20–5.82)
RDW: 16.1 % — AB (ref 11.0–14.6)
WBC: 0.3 10*3/uL — CL (ref 4.0–10.3)
lymph#: 0.3 10*3/uL — ABNORMAL LOW (ref 0.9–3.3)
nRBC: 0 % (ref 0–0)

## 2014-02-19 LAB — COMPREHENSIVE METABOLIC PANEL (CC13)
ALK PHOS: 66 U/L (ref 40–150)
ALT: <6 U/L (ref 0–55)
AST: 5 U/L (ref 5–34)
Albumin: 2.1 g/dL — ABNORMAL LOW (ref 3.5–5.0)
Anion Gap: 6 mEq/L (ref 3–11)
BILIRUBIN TOTAL: 1.24 mg/dL — AB (ref 0.20–1.20)
BUN: 8 mg/dL (ref 7.0–26.0)
CO2: 29 meq/L (ref 22–29)
Calcium: 8.4 mg/dL (ref 8.4–10.4)
Chloride: 103 mEq/L (ref 98–109)
Creatinine: 0.8 mg/dL (ref 0.7–1.3)
GLUCOSE: 105 mg/dL (ref 70–140)
Potassium: 3.3 mEq/L — ABNORMAL LOW (ref 3.5–5.1)
SODIUM: 139 meq/L (ref 136–145)
Total Protein: 5.7 g/dL — ABNORMAL LOW (ref 6.4–8.3)

## 2014-02-19 LAB — MAGNESIUM (CC13): MAGNESIUM: 1.9 mg/dL (ref 1.5–2.5)

## 2014-02-19 MED ORDER — HEPARIN SOD (PORK) LOCK FLUSH 100 UNIT/ML IV SOLN
500.0000 [IU] | Freq: Once | INTRAVENOUS | Status: AC
  Administered 2014-02-19: 500 [IU] via INTRAVENOUS
  Filled 2014-02-19: qty 5

## 2014-02-19 MED ORDER — SODIUM CHLORIDE 0.9 % IJ SOLN
10.0000 mL | INTRAMUSCULAR | Status: DC | PRN
  Administered 2014-02-19: 10 mL via INTRAVENOUS
  Filled 2014-02-19: qty 10

## 2014-02-19 NOTE — Telephone Encounter (Signed)
Pt cancelled labs/flush for wk of 10-19 pt will be having this done at [facility].... [name]

## 2014-02-19 NOTE — Patient Instructions (Signed)
PICC Home Guide A peripherally inserted central catheter (PICC) is a long, thin, flexible tube that is inserted into a vein in the upper arm. It is a form of intravenous (IV) access. It is considered to be a "central" line because the tip of the PICC ends in a large vein in your chest. This large vein is called the superior vena cava (SVC). The PICC tip ends in the SVC because there is a lot of blood flow in the SVC. This allows medicines and IV fluids to be quickly distributed throughout the body. The PICC is inserted using a sterile technique by a specially trained nurse or physician. After the PICC is inserted, a chest X-ray exam is done to be sure it is in the correct place.  A PICC may be placed for different reasons, such as:  To give medicines and liquid nutrition that can only be given through a central line. Examples are:  Certain antibiotic treatments.  Chemotherapy.  Total parenteral nutrition (TPN).  To take frequent blood samples.  To give IV fluids and blood products.  If there is difficulty placing a peripheral intravenous (PIV) catheter. If taken care of properly, a PICC can remain in place for several months. A PICC can also allow a person to go home from the hospital early. Medicine and PICC care can be managed at home by a family member or home health care team. WHAT PROBLEMS CAN HAPPEN WHEN I HAVE A PICC? Problems with a PICC can occasionally occur. These may include the following:  A blood clot (thrombus) forming in or at the tip of the PICC. This can cause the PICC to become clogged. A clot-dissolving medicine called tissue plasminogen activator (tPA) can be given through the PICC to help break up the clot.  Inflammation of the vein (phlebitis) in which the PICC is placed. Signs of inflammation may include redness, pain at the insertion site, red streaks, or being able to feel a "cord" in the vein where the PICC is located.  Infection in the PICC or at the insertion  site. Signs of infection may include fever, chills, redness, swelling, or pus drainage from the PICC insertion site.  PICC movement (malposition). The PICC tip may move from its original position due to excessive physical activity, forceful coughing, sneezing, or vomiting.  A break or cut in the PICC. It is important to not use scissors near the PICC.  Nerve or tendon irritation or injury during PICC insertion. WHAT SHOULD I KEEP IN MIND ABOUT ACTIVITIES WHEN I HAVE A PICC?  You may bend your arm and move it freely. If your PICC is near or at the bend of your elbow, avoid activity with repeated motion at the elbow.  Rest at home for the remainder of the day following PICC line insertion.  Avoid lifting heavy objects as instructed by your health care provider.  Avoid using a crutch with the arm on the same side as your PICC. You may need to use a walker. WHAT SHOULD I KNOW ABOUT MY PICC DRESSING?  Keep your PICC bandage (dressing) clean and dry to prevent infection.  Ask your health care provider when you may shower. Ask your health care provider to teach you how to wrap the PICC when you do take a shower.  Change the PICC dressing as instructed by your health care provider.  Change your PICC dressing if it becomes loose or wet. WHAT SHOULD I KNOW ABOUT PICC CARE?  Check the PICC insertion site   daily for leakage, redness, swelling, or pain.  Do not take a bath, swim, or use hot tubs when you have a PICC. Cover PICC line with clear plastic wrap and tape to keep it dry while showering.  Flush the PICC as directed by your health care provider. Let your health care provider know right away if the PICC is difficult to flush or does not flush. Do not use force to flush the PICC.  Do not use a syringe that is less than 10 mL to flush the PICC.  Never pull or tug on the PICC.  Avoid blood pressure checks on the arm with the PICC.  Keep your PICC identification card with you at all  times.  Do not take the PICC out yourself. Only a trained clinical professional should remove the PICC. SEEK IMMEDIATE MEDICAL CARE IF:  Your PICC is accidentally pulled all the way out. If this happens, cover the insertion site with a bandage or gauze dressing. Do not throw the PICC away. Your health care provider will need to inspect it.  Your PICC was tugged or pulled and has partially come out. Do not  push the PICC back in.  There is any type of drainage, redness, or swelling where the PICC enters the skin.  You cannot flush the PICC, it is difficult to flush, or the PICC leaks around the insertion site when it is flushed.  You hear a "flushing" sound when the PICC is flushed.  You have pain, discomfort, or numbness in your arm, shoulder, or jaw on the same side as the PICC.  You feel your heart "racing" or skipping beats.  You notice a hole or tear in the PICC.  You develop chills or a fever. MAKE SURE YOU:   Understand these instructions.  Will watch your condition.  Will get help right away if you are not doing well or get worse. Document Released: 10/29/2002 Document Revised: 09/08/2013 Document Reviewed: 12/30/2012 ExitCare Patient Information 2015 [employer]. This information is not intended to replace advice given to you by your health care provider. Make sure you discuss any questions you have with your health care provider.  

## 2014-02-19 NOTE — Telephone Encounter (Signed)
Per [facility] request, today's CBC result faxed to Dr. Clovis Fredrickson Powell's attn.

## 2014-02-23 ENCOUNTER — Other Ambulatory Visit: Payer: Non-veteran care

## 2014-02-23 ENCOUNTER — Other Ambulatory Visit: Payer: Medicare Other

## 2014-02-24 ENCOUNTER — Other Ambulatory Visit: Payer: Self-pay | Admitting: *Deleted

## 2014-02-24 DIAGNOSIS — D72819 Decreased white blood cell count, unspecified: Secondary | ICD-10-CM

## 2014-02-25 ENCOUNTER — Other Ambulatory Visit: Payer: Medicare Other

## 2014-02-25 ENCOUNTER — Telehealth: Payer: Self-pay | Admitting: Oncology

## 2014-02-25 NOTE — Telephone Encounter (Signed)
Spoke w/ pt wife to pick up cal for Nov on 10/26 appt.

## 2014-02-26 ENCOUNTER — Other Ambulatory Visit: Payer: Medicare Other

## 2014-03-02 ENCOUNTER — Telehealth: Payer: Self-pay | Admitting: Medical Oncology

## 2014-03-02 ENCOUNTER — Other Ambulatory Visit (HOSPITAL_BASED_OUTPATIENT_CLINIC_OR_DEPARTMENT_OTHER): Payer: Medicare Other

## 2014-03-02 ENCOUNTER — Ambulatory Visit (HOSPITAL_BASED_OUTPATIENT_CLINIC_OR_DEPARTMENT_OTHER): Payer: Medicare Other

## 2014-03-02 VITALS — BP 126/75 | HR 75 | Temp 97.8°F | Resp 16

## 2014-03-02 DIAGNOSIS — D72819 Decreased white blood cell count, unspecified: Secondary | ICD-10-CM

## 2014-03-02 DIAGNOSIS — Z452 Encounter for adjustment and management of vascular access device: Secondary | ICD-10-CM

## 2014-03-02 DIAGNOSIS — C9200 Acute myeloblastic leukemia, not having achieved remission: Secondary | ICD-10-CM

## 2014-03-02 LAB — CBC WITH DIFFERENTIAL/PLATELET
BASO%: 0 % (ref 0.0–2.0)
Basophils Absolute: 0 10*3/uL (ref 0.0–0.1)
EOS%: 0.5 % (ref 0.0–7.0)
Eosinophils Absolute: 0 10*3/uL (ref 0.0–0.5)
HCT: 31.3 % — ABNORMAL LOW (ref 38.4–49.9)
HEMOGLOBIN: 10 g/dL — AB (ref 13.0–17.1)
LYMPH#: 0.6 10*3/uL — AB (ref 0.9–3.3)
LYMPH%: 33.5 % (ref 14.0–49.0)
MCH: 29.7 pg (ref 27.2–33.4)
MCHC: 31.9 g/dL — AB (ref 32.0–36.0)
MCV: 92.9 fL (ref 79.3–98.0)
MONO#: 0 10*3/uL — AB (ref 0.1–0.9)
MONO%: 1.6 % (ref 0.0–14.0)
NEUT#: 1.2 10*3/uL — ABNORMAL LOW (ref 1.5–6.5)
NEUT%: 64.4 % (ref 39.0–75.0)
Platelets: 104 10*3/uL — ABNORMAL LOW (ref 140–400)
RBC: 3.37 10*6/uL — ABNORMAL LOW (ref 4.20–5.82)
RDW: 17 % — ABNORMAL HIGH (ref 11.0–14.6)
WBC: 1.9 10*3/uL — AB (ref 4.0–10.3)

## 2014-03-02 LAB — COMPREHENSIVE METABOLIC PANEL (CC13)
ALK PHOS: 69 U/L (ref 40–150)
ALT: 6 U/L (ref 0–55)
AST: 8 U/L (ref 5–34)
Albumin: 2.3 g/dL — ABNORMAL LOW (ref 3.5–5.0)
Anion Gap: 7 mEq/L (ref 3–11)
BUN: 12.2 mg/dL (ref 7.0–26.0)
CHLORIDE: 107 meq/L (ref 98–109)
CO2: 28 mEq/L (ref 22–29)
Calcium: 8.2 mg/dL — ABNORMAL LOW (ref 8.4–10.4)
Creatinine: 0.9 mg/dL (ref 0.7–1.3)
Glucose: 89 mg/dl (ref 70–140)
POTASSIUM: 3.7 meq/L (ref 3.5–5.1)
Sodium: 142 mEq/L (ref 136–145)
Total Bilirubin: 0.74 mg/dL (ref 0.20–1.20)
Total Protein: 5.8 g/dL — ABNORMAL LOW (ref 6.4–8.3)

## 2014-03-02 LAB — MAGNESIUM (CC13): MAGNESIUM: 2 mg/dL (ref 1.5–2.5)

## 2014-03-02 MED ORDER — HEPARIN SOD (PORK) LOCK FLUSH 100 UNIT/ML IV SOLN
500.0000 [IU] | Freq: Once | INTRAVENOUS | Status: AC
  Administered 2014-03-02: 250 [IU] via INTRAVENOUS
  Filled 2014-03-02: qty 5

## 2014-03-02 MED ORDER — SODIUM CHLORIDE 0.9 % IJ SOLN
10.0000 mL | INTRAMUSCULAR | Status: DC | PRN
  Administered 2014-03-02: 10 mL via INTRAVENOUS
  Filled 2014-03-02: qty 10

## 2014-03-02 NOTE — Patient Instructions (Signed)
PICC Home Guide A peripherally inserted central catheter (PICC) is a long, thin, flexible tube that is inserted into a vein in the upper arm. It is a form of intravenous (IV) access. It is considered to be a "central" line because the tip of the PICC ends in a large vein in your chest. This large vein is called the superior vena cava (SVC). The PICC tip ends in the SVC because there is a lot of blood flow in the SVC. This allows medicines and IV fluids to be quickly distributed throughout the body. The PICC is inserted using a sterile technique by a specially trained nurse or physician. After the PICC is inserted, a chest X-ray exam is done to be sure it is in the correct place.  A PICC may be placed for different reasons, such as:  To give medicines and liquid nutrition that can only be given through a central line. Examples are:  Certain antibiotic treatments.  Chemotherapy.  Total parenteral nutrition (TPN).  To take frequent blood samples.  To give IV fluids and blood products.  If there is difficulty placing a peripheral intravenous (PIV) catheter. If taken care of properly, a PICC can remain in place for several months. A PICC can also allow a person to go home from the hospital early. Medicine and PICC care can be managed at home by a family member or home health care team. WHAT PROBLEMS CAN HAPPEN WHEN I HAVE A PICC? Problems with a PICC can occasionally occur. These may include the following:  A blood clot (thrombus) forming in or at the tip of the PICC. This can cause the PICC to become clogged. A clot-dissolving medicine called tissue plasminogen activator (tPA) can be given through the PICC to help break up the clot.  Inflammation of the vein (phlebitis) in which the PICC is placed. Signs of inflammation may include redness, pain at the insertion site, red streaks, or being able to feel a "cord" in the vein where the PICC is located.  Infection in the PICC or at the insertion  site. Signs of infection may include fever, chills, redness, swelling, or pus drainage from the PICC insertion site.  PICC movement (malposition). The PICC tip may move from its original position due to excessive physical activity, forceful coughing, sneezing, or vomiting.  A break or cut in the PICC. It is important to not use scissors near the PICC.  Nerve or tendon irritation or injury during PICC insertion. WHAT SHOULD I KEEP IN MIND ABOUT ACTIVITIES WHEN I HAVE A PICC?  You may bend your arm and move it freely. If your PICC is near or at the bend of your elbow, avoid activity with repeated motion at the elbow.  Rest at home for the remainder of the day following PICC line insertion.  Avoid lifting heavy objects as instructed by your health care provider.  Avoid using a crutch with the arm on the same side as your PICC. You may need to use a walker. WHAT SHOULD I KNOW ABOUT MY PICC DRESSING?  Keep your PICC bandage (dressing) clean and dry to prevent infection.  Ask your health care provider when you may shower. Ask your health care provider to teach you how to wrap the PICC when you do take a shower.  Change the PICC dressing as instructed by your health care provider.  Change your PICC dressing if it becomes loose or wet. WHAT SHOULD I KNOW ABOUT PICC CARE?  Check the PICC insertion site   daily for leakage, redness, swelling, or pain.  Do not take a bath, swim, or use hot tubs when you have a PICC. Cover PICC line with clear plastic wrap and tape to keep it dry while showering.  Flush the PICC as directed by your health care provider. Let your health care provider know right away if the PICC is difficult to flush or does not flush. Do not use force to flush the PICC.  Do not use a syringe that is less than 10 mL to flush the PICC.  Never pull or tug on the PICC.  Avoid blood pressure checks on the arm with the PICC.  Keep your PICC identification card with you at all  times.  Do not take the PICC out yourself. Only a trained clinical professional should remove the PICC. SEEK IMMEDIATE MEDICAL CARE IF:  Your PICC is accidentally pulled all the way out. If this happens, cover the insertion site with a bandage or gauze dressing. Do not throw the PICC away. Your health care provider will need to inspect it.  Your PICC was tugged or pulled and has partially come out. Do not  push the PICC back in.  There is any type of drainage, redness, or swelling where the PICC enters the skin.  You cannot flush the PICC, it is difficult to flush, or the PICC leaks around the insertion site when it is flushed.  You hear a "flushing" sound when the PICC is flushed.  You have pain, discomfort, or numbness in your arm, shoulder, or jaw on the same side as the PICC.  You feel your heart "racing" or skipping beats.  You notice a hole or tear in the PICC.  You develop chills or a fever. MAKE SURE YOU:   Understand these instructions.  Will watch your condition.  Will get help right away if you are not doing well or get worse. Document Released: 10/29/2002 Document Revised: 09/08/2013 Document Reviewed: 12/30/2012 ExitCare Patient Information 2015 [employer]. This information is not intended to replace advice given to you by your health care provider. Make sure you discuss any questions you have with your health care provider.  

## 2014-03-02 NOTE — Telephone Encounter (Signed)
Per [facility] request, today's lab results faxed to [phone].

## 2014-03-05 ENCOUNTER — Telehealth: Payer: Self-pay | Admitting: Medical Oncology

## 2014-03-05 ENCOUNTER — Other Ambulatory Visit (HOSPITAL_BASED_OUTPATIENT_CLINIC_OR_DEPARTMENT_OTHER): Payer: Medicare Other

## 2014-03-05 ENCOUNTER — Ambulatory Visit: Payer: Medicare Other

## 2014-03-05 DIAGNOSIS — D509 Iron deficiency anemia, unspecified: Secondary | ICD-10-CM

## 2014-03-05 DIAGNOSIS — Z452 Encounter for adjustment and management of vascular access device: Secondary | ICD-10-CM

## 2014-03-05 DIAGNOSIS — D72819 Decreased white blood cell count, unspecified: Secondary | ICD-10-CM

## 2014-03-05 LAB — CBC WITH DIFFERENTIAL/PLATELET
BASO%: 0 % (ref 0.0–2.0)
BASOS ABS: 0 10*3/uL (ref 0.0–0.1)
EOS%: 0 % (ref 0.0–7.0)
Eosinophils Absolute: 0 10*3/uL (ref 0.0–0.5)
HEMATOCRIT: 29.6 % — AB (ref 38.4–49.9)
HEMOGLOBIN: 9.6 g/dL — AB (ref 13.0–17.1)
LYMPH#: 0.6 10*3/uL — AB (ref 0.9–3.3)
LYMPH%: 39.5 % (ref 14.0–49.0)
MCH: 30 pg (ref 27.2–33.4)
MCHC: 32.4 g/dL (ref 32.0–36.0)
MCV: 92.5 fL (ref 79.3–98.0)
MONO#: 0 10*3/uL — AB (ref 0.1–0.9)
MONO%: 2 % (ref 0.0–14.0)
NEUT%: 58.5 % (ref 39.0–75.0)
NEUTROS ABS: 0.9 10*3/uL — AB (ref 1.5–6.5)
Platelets: 62 10*3/uL — ABNORMAL LOW (ref 140–400)
RBC: 3.2 10*6/uL — ABNORMAL LOW (ref 4.20–5.82)
RDW: 16.7 % — AB (ref 11.0–14.6)
WBC: 1.5 10*3/uL — AB (ref 4.0–10.3)

## 2014-03-05 MED ORDER — HEPARIN SOD (PORK) LOCK FLUSH 100 UNIT/ML IV SOLN
500.0000 [IU] | Freq: Once | INTRAVENOUS | Status: AC
  Administered 2014-03-05: 500 [IU] via INTRAVENOUS
  Filled 2014-03-05: qty 5

## 2014-03-05 MED ORDER — SODIUM CHLORIDE 0.9 % IJ SOLN
10.0000 mL | INTRAMUSCULAR | Status: DC | PRN
  Administered 2014-03-05: 10 mL via INTRAVENOUS
  Filled 2014-03-05: qty 10

## 2014-03-05 NOTE — Patient Instructions (Signed)
PICC Home Guide A peripherally inserted central catheter (PICC) is a long, thin, flexible tube that is inserted into a vein in the upper arm. It is a form of intravenous (IV) access. It is considered to be a "central" line because the tip of the PICC ends in a large vein in your chest. This large vein is called the superior vena cava (SVC). The PICC tip ends in the SVC because there is a lot of blood flow in the SVC. This allows medicines and IV fluids to be quickly distributed throughout the body. The PICC is inserted using a sterile technique by a specially trained nurse or physician. After the PICC is inserted, a chest X-ray exam is done to be sure it is in the correct place.  A PICC may be placed for different reasons, such as:  To give medicines and liquid nutrition that can only be given through a central line. Examples are:  Certain antibiotic treatments.  Chemotherapy.  Total parenteral nutrition (TPN).  To take frequent blood samples.  To give IV fluids and blood products.  If there is difficulty placing a peripheral intravenous (PIV) catheter. If taken care of properly, a PICC can remain in place for several months. A PICC can also allow a person to go home from the hospital early. Medicine and PICC care can be managed at home by a family member or home health care team. WHAT PROBLEMS CAN HAPPEN WHEN I HAVE A PICC? Problems with a PICC can occasionally occur. These may include the following:  A blood clot (thrombus) forming in or at the tip of the PICC. This can cause the PICC to become clogged. A clot-dissolving medicine called tissue plasminogen activator (tPA) can be given through the PICC to help break up the clot.  Inflammation of the vein (phlebitis) in which the PICC is placed. Signs of inflammation may include redness, pain at the insertion site, red streaks, or being able to feel a "cord" in the vein where the PICC is located.  Infection in the PICC or at the insertion  site. Signs of infection may include fever, chills, redness, swelling, or pus drainage from the PICC insertion site.  PICC movement (malposition). The PICC tip may move from its original position due to excessive physical activity, forceful coughing, sneezing, or vomiting.  A break or cut in the PICC. It is important to not use scissors near the PICC.  Nerve or tendon irritation or injury during PICC insertion. WHAT SHOULD I KEEP IN MIND ABOUT ACTIVITIES WHEN I HAVE A PICC?  You may bend your arm and move it freely. If your PICC is near or at the bend of your elbow, avoid activity with repeated motion at the elbow.  Rest at home for the remainder of the day following PICC line insertion.  Avoid lifting heavy objects as instructed by your health care provider.  Avoid using a crutch with the arm on the same side as your PICC. You may need to use a walker. WHAT SHOULD I KNOW ABOUT MY PICC DRESSING?  Keep your PICC bandage (dressing) clean and dry to prevent infection.  Ask your health care provider when you may shower. Ask your health care provider to teach you how to wrap the PICC when you do take a shower.  Change the PICC dressing as instructed by your health care provider.  Change your PICC dressing if it becomes loose or wet. WHAT SHOULD I KNOW ABOUT PICC CARE?  Check the PICC insertion site   daily for leakage, redness, swelling, or pain.  Do not take a bath, swim, or use hot tubs when you have a PICC. Cover PICC line with clear plastic wrap and tape to keep it dry while showering.  Flush the PICC as directed by your health care provider. Let your health care provider know right away if the PICC is difficult to flush or does not flush. Do not use force to flush the PICC.  Do not use a syringe that is less than 10 mL to flush the PICC.  Never pull or tug on the PICC.  Avoid blood pressure checks on the arm with the PICC.  Keep your PICC identification card with you at all  times.  Do not take the PICC out yourself. Only a trained clinical professional should remove the PICC. SEEK IMMEDIATE MEDICAL CARE IF:  Your PICC is accidentally pulled all the way out. If this happens, cover the insertion site with a bandage or gauze dressing. Do not throw the PICC away. Your health care provider will need to inspect it.  Your PICC was tugged or pulled and has partially come out. Do not  push the PICC back in.  There is any type of drainage, redness, or swelling where the PICC enters the skin.  You cannot flush the PICC, it is difficult to flush, or the PICC leaks around the insertion site when it is flushed.  You hear a "flushing" sound when the PICC is flushed.  You have pain, discomfort, or numbness in your arm, shoulder, or jaw on the same side as the PICC.  You feel your heart "racing" or skipping beats.  You notice a hole or tear in the PICC.  You develop chills or a fever. MAKE SURE YOU:   Understand these instructions.  Will watch your condition.  Will get help right away if you are not doing well or get worse. Document Released: 10/29/2002 Document Revised: 09/08/2013 Document Reviewed: 12/30/2012 ExitCare Patient Information 2015 [employer]. This information is not intended to replace advice given to you by your health care provider. Make sure you discuss any questions you have with your health care provider.  

## 2014-03-05 NOTE — Telephone Encounter (Signed)
Per [facility] request, today's lab results faxed to [phone]

## 2014-03-09 ENCOUNTER — Ambulatory Visit (HOSPITAL_BASED_OUTPATIENT_CLINIC_OR_DEPARTMENT_OTHER): Payer: Medicare Other

## 2014-03-09 ENCOUNTER — Other Ambulatory Visit: Payer: Self-pay | Admitting: Medical Oncology

## 2014-03-09 ENCOUNTER — Ambulatory Visit (HOSPITAL_COMMUNITY)
Admission: RE | Admit: 2014-03-09 | Discharge: 2014-03-09 | Disposition: A | Discharge status: Home / Self Care | Payer: Medicare Other | Source: Ambulatory Visit | Attending: Oncology | Admitting: Oncology

## 2014-03-09 ENCOUNTER — Other Ambulatory Visit (HOSPITAL_BASED_OUTPATIENT_CLINIC_OR_DEPARTMENT_OTHER): Payer: Medicare Other

## 2014-03-09 ENCOUNTER — Other Ambulatory Visit: Payer: Self-pay | Admitting: *Deleted

## 2014-03-09 VITALS — BP 139/62 | HR 78 | Temp 98.0°F

## 2014-03-09 VITALS — BP 135/75 | HR 68 | Temp 98.1°F | Resp 16

## 2014-03-09 DIAGNOSIS — D649 Anemia, unspecified: Secondary | ICD-10-CM | POA: Insufficient documentation

## 2014-03-09 DIAGNOSIS — D72819 Decreased white blood cell count, unspecified: Secondary | ICD-10-CM

## 2014-03-09 DIAGNOSIS — D696 Thrombocytopenia, unspecified: Secondary | ICD-10-CM | POA: Insufficient documentation

## 2014-03-09 DIAGNOSIS — Z452 Encounter for adjustment and management of vascular access device: Secondary | ICD-10-CM

## 2014-03-09 DIAGNOSIS — D509 Iron deficiency anemia, unspecified: Secondary | ICD-10-CM

## 2014-03-09 LAB — CBC WITH DIFFERENTIAL/PLATELET
BASO%: 0 % (ref 0.0–2.0)
BASOS ABS: 0 10*3/uL (ref 0.0–0.1)
EOS ABS: 0 10*3/uL (ref 0.0–0.5)
EOS%: 0 % (ref 0.0–7.0)
HEMATOCRIT: 25.8 % — AB (ref 38.4–49.9)
HEMOGLOBIN: 8.4 g/dL — AB (ref 13.0–17.1)
LYMPH#: 0.4 10*3/uL — AB (ref 0.9–3.3)
LYMPH%: 61.5 % — ABNORMAL HIGH (ref 14.0–49.0)
MCH: 29.7 pg (ref 27.2–33.4)
MCHC: 32.6 g/dL (ref 32.0–36.0)
MCV: 91.2 fL (ref 79.3–98.0)
MONO#: 0 10*3/uL — ABNORMAL LOW (ref 0.1–0.9)
MONO%: 1.5 % (ref 0.0–14.0)
NEUT%: 37 % — AB (ref 39.0–75.0)
NEUTROS ABS: 0.2 10*3/uL — AB (ref 1.5–6.5)
Platelets: 22 10*3/uL — ABNORMAL LOW (ref 140–400)
RBC: 2.83 10*6/uL — ABNORMAL LOW (ref 4.20–5.82)
RDW: 16.5 % — ABNORMAL HIGH (ref 11.0–14.6)
WBC: 0.7 10*3/uL — CL (ref 4.0–10.3)
nRBC: 0 % (ref 0–0)

## 2014-03-09 LAB — COMPREHENSIVE METABOLIC PANEL (CC13)
ALBUMIN: 2.3 g/dL — AB (ref 3.5–5.0)
ALK PHOS: 70 U/L (ref 40–150)
ALT: <6 U/L (ref 0–55)
ANION GAP: 7 meq/L (ref 3–11)
AST: 5 U/L (ref 5–34)
BUN: 12.5 mg/dL (ref 7.0–26.0)
CO2: 28 meq/L (ref 22–29)
Calcium: 8.1 mg/dL — ABNORMAL LOW (ref 8.4–10.4)
Chloride: 105 mEq/L (ref 98–109)
Creatinine: 0.8 mg/dL (ref 0.7–1.3)
GLUCOSE: 111 mg/dL (ref 70–140)
Potassium: 3.5 mEq/L (ref 3.5–5.1)
SODIUM: 140 meq/L (ref 136–145)
TOTAL PROTEIN: 5.7 g/dL — AB (ref 6.4–8.3)
Total Bilirubin: 0.88 mg/dL (ref 0.20–1.20)

## 2014-03-09 LAB — MAGNESIUM (CC13): MAGNESIUM: 2.2 mg/dL (ref 1.5–2.5)

## 2014-03-09 LAB — HOLD TUBE, BLOOD BANK

## 2014-03-09 LAB — PREPARE RBC (CROSSMATCH)

## 2014-03-09 MED ORDER — SODIUM CHLORIDE 0.9 % IJ SOLN
10.0000 mL | INTRAMUSCULAR | Status: DC | PRN
  Administered 2014-03-09: 10 mL via INTRAVENOUS
  Filled 2014-03-09: qty 10

## 2014-03-09 MED ORDER — DIPHENHYDRAMINE HCL 25 MG PO CAPS
ORAL_CAPSULE | ORAL | Status: AC
  Filled 2014-03-09: qty 1

## 2014-03-09 MED ORDER — ACETAMINOPHEN 325 MG PO TABS
650.0000 mg | ORAL_TABLET | Freq: Once | ORAL | Status: AC
  Administered 2014-03-09: 650 mg via ORAL

## 2014-03-09 MED ORDER — ACETAMINOPHEN 325 MG PO TABS
ORAL_TABLET | ORAL | Status: AC
  Filled 2014-03-09: qty 2

## 2014-03-09 MED ORDER — HEPARIN SOD (PORK) LOCK FLUSH 100 UNIT/ML IV SOLN
500.0000 [IU] | Freq: Once | INTRAVENOUS | Status: AC
  Administered 2014-03-09: 250 [IU] via INTRAVENOUS
  Filled 2014-03-09: qty 5

## 2014-03-09 MED ORDER — DIPHENHYDRAMINE HCL 25 MG PO CAPS
25.0000 mg | ORAL_CAPSULE | Freq: Once | ORAL | Status: AC
  Administered 2014-03-09: 25 mg via ORAL

## 2014-03-09 NOTE — Patient Instructions (Signed)
PICC Home Guide A peripherally inserted central catheter (PICC) is a long, thin, flexible tube that is inserted into a vein in the upper arm. It is a form of intravenous (IV) access. It is considered to be a "central" line because the tip of the PICC ends in a large vein in your chest. This large vein is called the superior vena cava (SVC). The PICC tip ends in the SVC because there is a lot of blood flow in the SVC. This allows medicines and IV fluids to be quickly distributed throughout the body. The PICC is inserted using a sterile technique by a specially trained nurse or physician. After the PICC is inserted, a chest X-ray exam is done to be sure it is in the correct place.  A PICC may be placed for different reasons, such as:  To give medicines and liquid nutrition that can only be given through a central line. Examples are:  Certain antibiotic treatments.  Chemotherapy.  Total parenteral nutrition (TPN).  To take frequent blood samples.  To give IV fluids and blood products.  If there is difficulty placing a peripheral intravenous (PIV) catheter. If taken care of properly, a PICC can remain in place for several months. A PICC can also allow a person to go home from the hospital early. Medicine and PICC care can be managed at home by a family member or home health care team. WHAT PROBLEMS CAN HAPPEN WHEN I HAVE A PICC? Problems with a PICC can occasionally occur. These may include the following:  A blood clot (thrombus) forming in or at the tip of the PICC. This can cause the PICC to become clogged. A clot-dissolving medicine called tissue plasminogen activator (tPA) can be given through the PICC to help break up the clot.  Inflammation of the vein (phlebitis) in which the PICC is placed. Signs of inflammation may include redness, pain at the insertion site, red streaks, or being able to feel a "cord" in the vein where the PICC is located.  Infection in the PICC or at the insertion  site. Signs of infection may include fever, chills, redness, swelling, or pus drainage from the PICC insertion site.  PICC movement (malposition). The PICC tip may move from its original position due to excessive physical activity, forceful coughing, sneezing, or vomiting.  A break or cut in the PICC. It is important to not use scissors near the PICC.  Nerve or tendon irritation or injury during PICC insertion. WHAT SHOULD I KEEP IN MIND ABOUT ACTIVITIES WHEN I HAVE A PICC?  You may bend your arm and move it freely. If your PICC is near or at the bend of your elbow, avoid activity with repeated motion at the elbow.  Rest at home for the remainder of the day following PICC line insertion.  Avoid lifting heavy objects as instructed by your health care provider.  Avoid using a crutch with the arm on the same side as your PICC. You may need to use a walker. WHAT SHOULD I KNOW ABOUT MY PICC DRESSING?  Keep your PICC bandage (dressing) clean and dry to prevent infection.  Ask your health care provider when you may shower. Ask your health care provider to teach you how to wrap the PICC when you do take a shower.  Change the PICC dressing as instructed by your health care provider.  Change your PICC dressing if it becomes loose or wet. WHAT SHOULD I KNOW ABOUT PICC CARE?  Check the PICC insertion site   daily for leakage, redness, swelling, or pain.  Do not take a bath, swim, or use hot tubs when you have a PICC. Cover PICC line with clear plastic wrap and tape to keep it dry while showering.  Flush the PICC as directed by your health care provider. Let your health care provider know right away if the PICC is difficult to flush or does not flush. Do not use force to flush the PICC.  Do not use a syringe that is less than 10 mL to flush the PICC.  Never pull or tug on the PICC.  Avoid blood pressure checks on the arm with the PICC.  Keep your PICC identification card with you at all  times.  Do not take the PICC out yourself. Only a trained clinical professional should remove the PICC. SEEK IMMEDIATE MEDICAL CARE IF:  Your PICC is accidentally pulled all the way out. If this happens, cover the insertion site with a bandage or gauze dressing. Do not throw the PICC away. Your health care provider will need to inspect it.  Your PICC was tugged or pulled and has partially come out. Do not  push the PICC back in.  There is any type of drainage, redness, or swelling where the PICC enters the skin.  You cannot flush the PICC, it is difficult to flush, or the PICC leaks around the insertion site when it is flushed.  You hear a "flushing" sound when the PICC is flushed.  You have pain, discomfort, or numbness in your arm, shoulder, or jaw on the same side as the PICC.  You feel your heart "racing" or skipping beats.  You notice a hole or tear in the PICC.  You develop chills or a fever. MAKE SURE YOU:   Understand these instructions.  Will watch your condition.  Will get help right away if you are not doing well or get worse. Document Released: 10/29/2002 Document Revised: 09/08/2013 Document Reviewed: 12/30/2012 ExitCare Patient Information 2015 [employer]. This information is not intended to replace advice given to you by your health care provider. Make sure you discuss any questions you have with your health care provider.  

## 2014-03-09 NOTE — Patient Instructions (Signed)

## 2014-03-10 ENCOUNTER — Ambulatory Visit (HOSPITAL_BASED_OUTPATIENT_CLINIC_OR_DEPARTMENT_OTHER): Payer: Medicare Other

## 2014-03-10 ENCOUNTER — Other Ambulatory Visit: Payer: Self-pay | Admitting: Medical Oncology

## 2014-03-10 VITALS — BP 153/87 | HR 66 | Temp 97.9°F | Resp 16

## 2014-03-10 DIAGNOSIS — D6489 Other specified anemias: Secondary | ICD-10-CM

## 2014-03-10 DIAGNOSIS — D509 Iron deficiency anemia, unspecified: Secondary | ICD-10-CM

## 2014-03-10 DIAGNOSIS — D649 Anemia, unspecified: Secondary | ICD-10-CM | POA: Diagnosis not present

## 2014-03-10 LAB — PREPARE RBC (CROSSMATCH)

## 2014-03-10 MED ORDER — DIPHENHYDRAMINE HCL 25 MG PO CAPS
25.0000 mg | ORAL_CAPSULE | Freq: Once | ORAL | Status: AC
  Administered 2014-03-10: 25 mg via ORAL

## 2014-03-10 MED ORDER — ACETAMINOPHEN 325 MG PO TABS
650.0000 mg | ORAL_TABLET | Freq: Once | ORAL | Status: AC
  Administered 2014-03-10: 650 mg via ORAL

## 2014-03-10 MED ORDER — SODIUM CHLORIDE 0.9 % IJ SOLN
10.0000 mL | INTRAMUSCULAR | Status: AC | PRN
  Administered 2014-03-10: 10 mL
  Filled 2014-03-10: qty 10

## 2014-03-10 MED ORDER — DIPHENHYDRAMINE HCL 25 MG PO CAPS
ORAL_CAPSULE | ORAL | Status: AC
  Filled 2014-03-10: qty 1

## 2014-03-10 MED ORDER — ACETAMINOPHEN 325 MG PO TABS
ORAL_TABLET | ORAL | Status: AC
  Filled 2014-03-10: qty 2

## 2014-03-10 MED ORDER — HEPARIN SOD (PORK) LOCK FLUSH 100 UNIT/ML IV SOLN
250.0000 [IU] | INTRAVENOUS | Status: AC | PRN
  Administered 2014-03-10: 250 [IU]
  Filled 2014-03-10: qty 5

## 2014-03-10 MED ORDER — SODIUM CHLORIDE 0.9 % IV SOLN
250.0000 mL | Freq: Once | INTRAVENOUS | Status: AC
  Administered 2014-03-10: 250 mL via INTRAVENOUS

## 2014-03-10 NOTE — Patient Instructions (Signed)

## 2014-03-10 NOTE — Progress Notes (Signed)
Order entered in error under orders only

## 2014-03-11 ENCOUNTER — Encounter: Payer: Self-pay | Admitting: *Deleted

## 2014-03-11 LAB — TYPE AND SCREEN
ABO/RH(D): O POS
Antibody Screen: NEGATIVE
Unit division: 0
Unit division: 0

## 2014-03-12 ENCOUNTER — Other Ambulatory Visit (HOSPITAL_BASED_OUTPATIENT_CLINIC_OR_DEPARTMENT_OTHER): Payer: Medicare Other

## 2014-03-12 ENCOUNTER — Ambulatory Visit: Payer: Medicare Other

## 2014-03-12 ENCOUNTER — Ambulatory Visit (HOSPITAL_BASED_OUTPATIENT_CLINIC_OR_DEPARTMENT_OTHER): Payer: Medicare Other

## 2014-03-12 ENCOUNTER — Other Ambulatory Visit: Payer: Self-pay | Admitting: *Deleted

## 2014-03-12 VITALS — BP 111/67 | HR 62 | Temp 98.7°F | Resp 18

## 2014-03-12 VITALS — BP 144/70 | HR 84 | Temp 98.9°F | Resp 18

## 2014-03-12 DIAGNOSIS — D72819 Decreased white blood cell count, unspecified: Secondary | ICD-10-CM

## 2014-03-12 DIAGNOSIS — Z452 Encounter for adjustment and management of vascular access device: Secondary | ICD-10-CM

## 2014-03-12 DIAGNOSIS — C9200 Acute myeloblastic leukemia, not having achieved remission: Secondary | ICD-10-CM

## 2014-03-12 DIAGNOSIS — D649 Anemia, unspecified: Secondary | ICD-10-CM | POA: Diagnosis not present

## 2014-03-12 DIAGNOSIS — D696 Thrombocytopenia, unspecified: Secondary | ICD-10-CM

## 2014-03-12 DIAGNOSIS — D509 Iron deficiency anemia, unspecified: Secondary | ICD-10-CM

## 2014-03-12 LAB — COMPREHENSIVE METABOLIC PANEL (CC13)
ALBUMIN: 2.5 g/dL — AB (ref 3.5–5.0)
ALT: <6 U/L (ref 0–55)
AST: 6 U/L (ref 5–34)
Alkaline Phosphatase: 76 U/L (ref 40–150)
Anion Gap: 9 mEq/L (ref 3–11)
BUN: 11.6 mg/dL (ref 7.0–26.0)
CALCIUM: 8.6 mg/dL (ref 8.4–10.4)
CHLORIDE: 102 meq/L (ref 98–109)
CO2: 29 mEq/L (ref 22–29)
Creatinine: 0.8 mg/dL (ref 0.7–1.3)
Glucose: 98 mg/dl (ref 70–140)
POTASSIUM: 3.5 meq/L (ref 3.5–5.1)
SODIUM: 139 meq/L (ref 136–145)
TOTAL PROTEIN: 6.2 g/dL — AB (ref 6.4–8.3)
Total Bilirubin: 1.45 mg/dL — ABNORMAL HIGH (ref 0.20–1.20)

## 2014-03-12 LAB — CBC WITH DIFFERENTIAL/PLATELET
BASO%: 1.4 % (ref 0.0–2.0)
Basophils Absolute: 0 10*3/uL (ref 0.0–0.1)
EOS%: 0 % (ref 0.0–7.0)
Eosinophils Absolute: 0 10*3/uL (ref 0.0–0.5)
HEMATOCRIT: 33.5 % — AB (ref 38.4–49.9)
HGB: 11.1 g/dL — ABNORMAL LOW (ref 13.0–17.1)
LYMPH#: 0.7 10*3/uL — AB (ref 0.9–3.3)
LYMPH%: 89.2 % — ABNORMAL HIGH (ref 14.0–49.0)
MCH: 29.4 pg (ref 27.2–33.4)
MCHC: 33.1 g/dL (ref 32.0–36.0)
MCV: 88.6 fL (ref 79.3–98.0)
MONO#: 0 10*3/uL — AB (ref 0.1–0.9)
MONO%: 1.4 % (ref 0.0–14.0)
NEUT#: 0.1 10*3/uL — CL (ref 1.5–6.5)
NEUT%: 8 % — AB (ref 39.0–75.0)
Platelets: 18 10*3/uL — ABNORMAL LOW (ref 140–400)
RBC: 3.78 10*6/uL — ABNORMAL LOW (ref 4.20–5.82)
RDW: 17.8 % — AB (ref 11.0–14.6)
WBC: 0.7 10*3/uL — AB (ref 4.0–10.3)
nRBC: 0 % (ref 0–0)

## 2014-03-12 LAB — MAGNESIUM (CC13): Magnesium: 1.9 mg/dl (ref 1.5–2.5)

## 2014-03-12 MED ORDER — SODIUM CHLORIDE 0.9 % IV SOLN
250.0000 mL | Freq: Once | INTRAVENOUS | Status: AC
  Administered 2014-03-12: 250 mL via INTRAVENOUS

## 2014-03-12 MED ORDER — HEPARIN SOD (PORK) LOCK FLUSH 100 UNIT/ML IV SOLN
500.0000 [IU] | Freq: Once | INTRAVENOUS | Status: AC
  Administered 2014-03-12: 250 [IU] via INTRAVENOUS
  Filled 2014-03-12: qty 5

## 2014-03-12 MED ORDER — SODIUM CHLORIDE 0.9 % IJ SOLN
10.0000 mL | INTRAMUSCULAR | Status: DC | PRN
  Filled 2014-03-12: qty 10

## 2014-03-12 MED ORDER — ACETAMINOPHEN 325 MG PO TABS: 650.0000 mg | ORAL_TABLET | Freq: Once | ORAL | Status: DC

## 2014-03-12 MED ORDER — SODIUM CHLORIDE 0.9 % IJ SOLN
10.0000 mL | INTRAMUSCULAR | Status: DC | PRN
  Administered 2014-03-12: 10 mL via INTRAVENOUS
  Filled 2014-03-12: qty 10

## 2014-03-12 MED ORDER — ACETAMINOPHEN 325 MG PO TABS
650.0000 mg | ORAL_TABLET | Freq: Once | ORAL | Status: AC
  Administered 2014-03-12: 650 mg via ORAL

## 2014-03-12 MED ORDER — DIPHENHYDRAMINE HCL 25 MG PO CAPS: 25.0000 mg | ORAL_CAPSULE | Freq: Once | ORAL | Status: DC

## 2014-03-12 MED ORDER — ACETAMINOPHEN 325 MG PO TABS
ORAL_TABLET | ORAL | Status: AC
  Filled 2014-03-12: qty 2

## 2014-03-12 MED ORDER — DIPHENHYDRAMINE HCL 25 MG PO CAPS
ORAL_CAPSULE | ORAL | Status: AC
Start: 2014-03-12 — End: 2014-03-12
  Filled 2014-03-12: qty 1

## 2014-03-12 MED ORDER — HEPARIN SOD (PORK) LOCK FLUSH 100 UNIT/ML IV SOLN
500.0000 [IU] | Freq: Once | INTRAVENOUS | Status: DC
  Filled 2014-03-12: qty 5

## 2014-03-12 MED ORDER — DIPHENHYDRAMINE HCL 25 MG PO CAPS
25.0000 mg | ORAL_CAPSULE | Freq: Once | ORAL | Status: AC
  Administered 2014-03-12: 25 mg via ORAL

## 2014-03-12 NOTE — Progress Notes (Signed)
Faxed labs to [place]

## 2014-03-12 NOTE — Patient Instructions (Signed)
PICC Home Guide A peripherally inserted central catheter (PICC) is a long, thin, flexible tube that is inserted into a vein in the upper arm. It is a form of intravenous (IV) access. It is considered to be a "central" line because the tip of the PICC ends in a large vein in your chest. This large vein is called the superior vena cava (SVC). The PICC tip ends in the SVC because there is a lot of blood flow in the SVC. This allows medicines and IV fluids to be quickly distributed throughout the body. The PICC is inserted using a sterile technique by a specially trained nurse or physician. After the PICC is inserted, a chest X-ray exam is done to be sure it is in the correct place.  A PICC may be placed for different reasons, such as:  To give medicines and liquid nutrition that can only be given through a central line. Examples are:  Certain antibiotic treatments.  Chemotherapy.  Total parenteral nutrition (TPN).  To take frequent blood samples.  To give IV fluids and blood products.  If there is difficulty placing a peripheral intravenous (PIV) catheter. If taken care of properly, a PICC can remain in place for several months. A PICC can also allow a person to go home from the hospital early. Medicine and PICC care can be managed at home by a family member or home health care team. WHAT PROBLEMS CAN HAPPEN WHEN I HAVE A PICC? Problems with a PICC can occasionally occur. These may include the following:  A blood clot (thrombus) forming in or at the tip of the PICC. This can cause the PICC to become clogged. A clot-dissolving medicine called tissue plasminogen activator (tPA) can be given through the PICC to help break up the clot.  Inflammation of the vein (phlebitis) in which the PICC is placed. Signs of inflammation may include redness, pain at the insertion site, red streaks, or being able to feel a "cord" in the vein where the PICC is located.  Infection in the PICC or at the insertion  site. Signs of infection may include fever, chills, redness, swelling, or pus drainage from the PICC insertion site.  PICC movement (malposition). The PICC tip may move from its original position due to excessive physical activity, forceful coughing, sneezing, or vomiting.  A break or cut in the PICC. It is important to not use scissors near the PICC.  Nerve or tendon irritation or injury during PICC insertion. WHAT SHOULD I KEEP IN MIND ABOUT ACTIVITIES WHEN I HAVE A PICC?  You may bend your arm and move it freely. If your PICC is near or at the bend of your elbow, avoid activity with repeated motion at the elbow.  Rest at home for the remainder of the day following PICC line insertion.  Avoid lifting heavy objects as instructed by your health care provider.  Avoid using a crutch with the arm on the same side as your PICC. You may need to use a walker. WHAT SHOULD I KNOW ABOUT MY PICC DRESSING?  Keep your PICC bandage (dressing) clean and dry to prevent infection.  Ask your health care provider when you may shower. Ask your health care provider to teach you how to wrap the PICC when you do take a shower.  Change the PICC dressing as instructed by your health care provider.  Change your PICC dressing if it becomes loose or wet. WHAT SHOULD I KNOW ABOUT PICC CARE?  Check the PICC insertion site   daily for leakage, redness, swelling, or pain.  Do not take a bath, swim, or use hot tubs when you have a PICC. Cover PICC line with clear plastic wrap and tape to keep it dry while showering.  Flush the PICC as directed by your health care provider. Let your health care provider know right away if the PICC is difficult to flush or does not flush. Do not use force to flush the PICC.  Do not use a syringe that is less than 10 mL to flush the PICC.  Never pull or tug on the PICC.  Avoid blood pressure checks on the arm with the PICC.  Keep your PICC identification card with you at all  times.  Do not take the PICC out yourself. Only a trained clinical professional should remove the PICC. SEEK IMMEDIATE MEDICAL CARE IF:  Your PICC is accidentally pulled all the way out. If this happens, cover the insertion site with a bandage or gauze dressing. Do not throw the PICC away. Your health care provider will need to inspect it.  Your PICC was tugged or pulled and has partially come out. Do not  push the PICC back in.  There is any type of drainage, redness, or swelling where the PICC enters the skin.  You cannot flush the PICC, it is difficult to flush, or the PICC leaks around the insertion site when it is flushed.  You hear a "flushing" sound when the PICC is flushed.  You have pain, discomfort, or numbness in your arm, shoulder, or jaw on the same side as the PICC.  You feel your heart "racing" or skipping beats.  You notice a hole or tear in the PICC.  You develop chills or a fever. MAKE SURE YOU:   Understand these instructions.  Will watch your condition.  Will get help right away if you are not doing well or get worse. Document Released: 10/29/2002 Document Revised: 09/08/2013 Document Reviewed: 12/30/2012 ExitCare Patient Information 2015 [employer]. This information is not intended to replace advice given to you by your health care provider. Make sure you discuss any questions you have with your health care provider.  

## 2014-03-12 NOTE — Patient Instructions (Signed)
Platelet Transfusion Information °This is information about transfusions of platelets. Platelets are tiny cells made by the bone marrow and found in the blood. When a blood vessel is damaged, platelets rush to the damaged area to help form a clot. This begins the healing process. When platelets get very low, your blood may have trouble clotting. This may be from: °· Illness. °· Blood disorder. °· Chemotherapy to treat cancer. °Often, lower platelet counts do not cause problems.  °Platelets usually last for 7 to 10 days. If they are not used in an injury, they are broken down by the liver or spleen. °Symptoms of low platelet count include: °· Nosebleeds. °· Bleeding gums. °· Heavy periods. °· Bruising and tiny blood spots in the skin. °¨ Pinpoint spots of bleeding (petechiae). °¨ Larger bruises (purpura). °· Bleeding can be more serious if it happens in the brain or bowel. °Platelet transfusions are often used to keep the platelet count at an acceptable level. Serious bleeding due to low platelets is uncommon. °RISKS AND COMPLICATIONS °Severe side effects from platelet transfusions are uncommon. Minor reactions may include: °· Itching. °· Rashes. °· High temperature and shivering. °Medications are available to stop transfusion reactions. Let your health care provider know if you develop any of the above problems.  °If you are having platelet transfusions frequently, they may get less effective. This is called becoming refractory to platelets. It is uncommon. This can happen from non-immune causes and immune causes. Non-immune causes include: °· High temperatures. °· Some medications. °· An enlarged spleen. °Immune causes happen when your body discovers the platelets are not your own and begins making antibodies against them. The antibodies kill the platelets quickly. Even with platelet transfusions, you may still notice problems with bleeding or bruising. Let your health care providers know about this. Other things  can be done to help if this happens.  °BEFORE THE PROCEDURE  °· Your health care provider will check your platelet count regularly. °· If the platelet count is too low, it may be necessary to have a platelet transfusion. °· This is more important before certain procedures with a risk of bleeding, such as a spinal tap. °· Platelet transfusion reduces the risk of bleeding during or after the procedure. °· Except in emergencies, giving a transfusion requires a written consent. °Before blood is taken from a donor, a complete history is taken to make sure the person has no history of previous diseases, nor engages in risky social behavior. Examples of this are intravenous drug use or sexual activity with multiple partners. This could lead to infected blood or blood products being used. This history is taken in spite of the extensive testing to make sure the blood is safe. All blood products transfused are tested to make sure it is a match for the person getting the blood. It is also checked for infections. Blood is the safest it has ever been. The risk of getting an infection is very low. °PROCEDURE °· The platelets are stored in small plastic bags that are kept at a low temperature. °· Each bag is called a unit and sometimes two units are given. They are given through an intravenous line by drip infusion over about one-half hour. °· Usually blood is collected from multiple people to get enough to transfuse. °· Sometimes, the platelets are collected from a single person. This is done using a special machine that separates the platelets from the blood. The machine is called an apheresis machine. Platelets collected in this   way are called apheresed platelets. Apheresed platelets reduce the risk of becoming sensitive to the platelets. This lowers the chances of having a transfusion reaction. °· As it only takes a short time to give the platelets, this treatment can be given in an outpatient department. Platelets can also be  given before or after other treatments. °SEEK IMMEDIATE MEDICAL CARE IF: °You have any of the following symptoms over the next 12 hours or several days: °· Shaking chills. °· Fever with a temperature greater than 102°F (38.9°C) develops. °· Back pain or muscle pain. °· People around you feel you are not acting correctly, or you are confused. °· Blood in the urine or bowel movements, or bleeding from any place in your body. °· Shortness of breath, or difficulty breathing. °· Dizziness. °· Fainting. °· You break out in a rash or develop hives. °· Decrease in the amount of urine you are putting out, or the urine turns a dark color or changes to pink, red, or brown. °· A severe headache or stiff neck. °· Bruising more easily. °Document Released: 02/19/2007 Document Revised: 09/08/2013 Document Reviewed: 02/19/2007 °ExitCare® Patient Information ©2015 [employer]. This information is not intended to replace advice given to you by your health care provider. Make sure you discuss any questions you have with your health care provider. ° °

## 2014-03-13 LAB — PREPARE PLATELET PHERESIS: Unit division: 0

## 2014-03-16 ENCOUNTER — Non-Acute Institutional Stay (HOSPITAL_COMMUNITY): Admit: 2014-03-16 | Payer: Self-pay

## 2014-03-16 ENCOUNTER — Other Ambulatory Visit (HOSPITAL_BASED_OUTPATIENT_CLINIC_OR_DEPARTMENT_OTHER): Payer: Medicare Other

## 2014-03-16 ENCOUNTER — Ambulatory Visit: Payer: Medicare Other

## 2014-03-16 ENCOUNTER — Non-Acute Institutional Stay (HOSPITAL_COMMUNITY)
Admission: AD | Admit: 2014-03-16 | Discharge: 2014-03-16 | Disposition: A | Discharge status: Home / Self Care | Payer: Non-veteran care | Source: Ambulatory Visit | Attending: Oncology | Admitting: Oncology

## 2014-03-16 ENCOUNTER — Encounter: Payer: Self-pay | Admitting: *Deleted

## 2014-03-16 ENCOUNTER — Ambulatory Visit (HOSPITAL_BASED_OUTPATIENT_CLINIC_OR_DEPARTMENT_OTHER): Payer: Medicare Other

## 2014-03-16 DIAGNOSIS — D500 Iron deficiency anemia secondary to blood loss (chronic): Secondary | ICD-10-CM | POA: Insufficient documentation

## 2014-03-16 DIAGNOSIS — C9200 Acute myeloblastic leukemia, not having achieved remission: Secondary | ICD-10-CM

## 2014-03-16 DIAGNOSIS — D649 Anemia, unspecified: Secondary | ICD-10-CM | POA: Diagnosis not present

## 2014-03-16 DIAGNOSIS — E43 Unspecified severe protein-calorie malnutrition: Secondary | ICD-10-CM | POA: Insufficient documentation

## 2014-03-16 DIAGNOSIS — I10 Essential (primary) hypertension: Secondary | ICD-10-CM | POA: Diagnosis not present

## 2014-03-16 DIAGNOSIS — Z888 Allergy status to other drugs, medicaments and biological substances status: Secondary | ICD-10-CM | POA: Diagnosis not present

## 2014-03-16 DIAGNOSIS — N400 Enlarged prostate without lower urinary tract symptoms: Secondary | ICD-10-CM | POA: Diagnosis not present

## 2014-03-16 DIAGNOSIS — D72819 Decreased white blood cell count, unspecified: Secondary | ICD-10-CM

## 2014-03-16 DIAGNOSIS — K5090 Crohn's disease, unspecified, without complications: Secondary | ICD-10-CM | POA: Insufficient documentation

## 2014-03-16 DIAGNOSIS — Z452 Encounter for adjustment and management of vascular access device: Secondary | ICD-10-CM

## 2014-03-16 LAB — COMPREHENSIVE METABOLIC PANEL (CC13)
ALT: 8 U/L (ref 0–55)
ANION GAP: 9 meq/L (ref 3–11)
AST: 11 U/L (ref 5–34)
Albumin: 2 g/dL — ABNORMAL LOW (ref 3.5–5.0)
Alkaline Phosphatase: 61 U/L (ref 40–150)
BILIRUBIN TOTAL: 1.05 mg/dL (ref 0.20–1.20)
BUN: 39.1 mg/dL — AB (ref 7.0–26.0)
CO2: 28 meq/L (ref 22–29)
Calcium: 8.3 mg/dL — ABNORMAL LOW (ref 8.4–10.4)
Chloride: 98 mEq/L (ref 98–109)
Creatinine: 1.9 mg/dL — ABNORMAL HIGH (ref 0.7–1.3)
GLUCOSE: 116 mg/dL (ref 70–140)
Potassium: 3.5 mEq/L (ref 3.5–5.1)
SODIUM: 135 meq/L — AB (ref 136–145)
TOTAL PROTEIN: 5.8 g/dL — AB (ref 6.4–8.3)

## 2014-03-16 LAB — CBC WITH DIFFERENTIAL/PLATELET
BASO%: 0 % (ref 0.0–2.0)
Basophils Absolute: 0 10*3/uL (ref 0.0–0.1)
EOS ABS: 0 10*3/uL (ref 0.0–0.5)
EOS%: 7 % (ref 0.0–7.0)
HEMATOCRIT: 26.7 % — AB (ref 38.4–49.9)
HEMOGLOBIN: 8.8 g/dL — AB (ref 13.0–17.1)
LYMPH%: 86 % — ABNORMAL HIGH (ref 14.0–49.0)
MCH: 29.3 pg (ref 27.2–33.4)
MCHC: 33 g/dL (ref 32.0–36.0)
MCV: 89 fL (ref 79.3–98.0)
MONO#: 0 10*3/uL — ABNORMAL LOW (ref 0.1–0.9)
MONO%: 0 % (ref 0.0–14.0)
NEUT%: 7 % — ABNORMAL LOW (ref 39.0–75.0)
NEUTROS ABS: 0 10*3/uL — AB (ref 1.5–6.5)
Platelets: 39 10*3/uL — ABNORMAL LOW (ref 140–400)
RBC: 3 10*6/uL — ABNORMAL LOW (ref 4.20–5.82)
RDW: 17.8 % — AB (ref 11.0–14.6)
WBC: 0.4 10*3/uL — CL (ref 4.0–10.3)
lymph#: 0.4 10*3/uL — ABNORMAL LOW (ref 0.9–3.3)
nRBC: 0 % (ref 0–0)

## 2014-03-16 LAB — MAGNESIUM (CC13): MAGNESIUM: 2 mg/dL (ref 1.5–2.5)

## 2014-03-16 LAB — PREPARE RBC (CROSSMATCH)

## 2014-03-16 MED ORDER — SODIUM CHLORIDE 0.9 % IV SOLN
250.0000 mL | Freq: Once | INTRAVENOUS | Status: AC
  Administered 2014-03-16: 250 mL via INTRAVENOUS

## 2014-03-16 MED ORDER — HEPARIN SOD (PORK) LOCK FLUSH 100 UNIT/ML IV SOLN
500.0000 [IU] | Freq: Every day | INTRAVENOUS | Status: AC | PRN
  Administered 2014-03-16: 500 [IU]
  Filled 2014-03-16: qty 5

## 2014-03-16 MED ORDER — HEPARIN SOD (PORK) LOCK FLUSH 100 UNIT/ML IV SOLN
500.0000 [IU] | Freq: Once | INTRAVENOUS | Status: AC
  Administered 2014-03-16: 250 [IU] via INTRAVENOUS
  Filled 2014-03-16: qty 5

## 2014-03-16 MED ORDER — DIPHENHYDRAMINE HCL 25 MG PO CAPS
25.0000 mg | ORAL_CAPSULE | Freq: Once | ORAL | Status: AC
  Administered 2014-03-16: 25 mg via ORAL
  Filled 2014-03-16: qty 1

## 2014-03-16 MED ORDER — SODIUM CHLORIDE 0.9 % IJ SOLN
10.0000 mL | INTRAMUSCULAR | Status: AC | PRN
  Administered 2014-03-16: 10 mL

## 2014-03-16 MED ORDER — SODIUM CHLORIDE 0.9 % IJ SOLN
10.0000 mL | INTRAMUSCULAR | Status: DC | PRN
  Administered 2014-03-16: 10 mL via INTRAVENOUS
  Filled 2014-03-16: qty 10

## 2014-03-16 MED ORDER — ACETAMINOPHEN 325 MG PO TABS
650.0000 mg | ORAL_TABLET | Freq: Once | ORAL | Status: AC
  Administered 2014-03-16: 650 mg via ORAL
  Filled 2014-03-16: qty 2

## 2014-03-16 NOTE — Patient Instructions (Signed)
PICC Home Guide A peripherally inserted central catheter (PICC) is a long, thin, flexible tube that is inserted into a vein in the upper arm. It is a form of intravenous (IV) access. It is considered to be a "central" line because the tip of the PICC ends in a large vein in your chest. This large vein is called the superior vena cava (SVC). The PICC tip ends in the SVC because there is a lot of blood flow in the SVC. This allows medicines and IV fluids to be quickly distributed throughout the body. The PICC is inserted using a sterile technique by a specially trained nurse or physician. After the PICC is inserted, a chest X-ray exam is done to be sure it is in the correct place.  A PICC may be placed for different reasons, such as:  To give medicines and liquid nutrition that can only be given through a central line. Examples are:  Certain antibiotic treatments.  Chemotherapy.  Total parenteral nutrition (TPN).  To take frequent blood samples.  To give IV fluids and blood products.  If there is difficulty placing a peripheral intravenous (PIV) catheter. If taken care of properly, a PICC can remain in place for several months. A PICC can also allow a person to go home from the hospital early. Medicine and PICC care can be managed at home by a family member or home health care team. WHAT PROBLEMS CAN HAPPEN WHEN I HAVE A PICC? Problems with a PICC can occasionally occur. These may include the following:  A blood clot (thrombus) forming in or at the tip of the PICC. This can cause the PICC to become clogged. A clot-dissolving medicine called tissue plasminogen activator (tPA) can be given through the PICC to help break up the clot.  Inflammation of the vein (phlebitis) in which the PICC is placed. Signs of inflammation may include redness, pain at the insertion site, red streaks, or being able to feel a "cord" in the vein where the PICC is located.  Infection in the PICC or at the insertion  site. Signs of infection may include fever, chills, redness, swelling, or pus drainage from the PICC insertion site.  PICC movement (malposition). The PICC tip may move from its original position due to excessive physical activity, forceful coughing, sneezing, or vomiting.  A break or cut in the PICC. It is important to not use scissors near the PICC.  Nerve or tendon irritation or injury during PICC insertion. WHAT SHOULD I KEEP IN MIND ABOUT ACTIVITIES WHEN I HAVE A PICC?  You may bend your arm and move it freely. If your PICC is near or at the bend of your elbow, avoid activity with repeated motion at the elbow.  Rest at home for the remainder of the day following PICC line insertion.  Avoid lifting heavy objects as instructed by your health care provider.  Avoid using a crutch with the arm on the same side as your PICC. You may need to use a walker. WHAT SHOULD I KNOW ABOUT MY PICC DRESSING?  Keep your PICC bandage (dressing) clean and dry to prevent infection.  Ask your health care provider when you may shower. Ask your health care provider to teach you how to wrap the PICC when you do take a shower.  Change the PICC dressing as instructed by your health care provider.  Change your PICC dressing if it becomes loose or wet. WHAT SHOULD I KNOW ABOUT PICC CARE?  Check the PICC insertion site   daily for leakage, redness, swelling, or pain.  Do not take a bath, swim, or use hot tubs when you have a PICC. Cover PICC line with clear plastic wrap and tape to keep it dry while showering.  Flush the PICC as directed by your health care provider. Let your health care provider know right away if the PICC is difficult to flush or does not flush. Do not use force to flush the PICC.  Do not use a syringe that is less than 10 mL to flush the PICC.  Never pull or tug on the PICC.  Avoid blood pressure checks on the arm with the PICC.  Keep your PICC identification card with you at all  times.  Do not take the PICC out yourself. Only a trained clinical professional should remove the PICC. SEEK IMMEDIATE MEDICAL CARE IF:  Your PICC is accidentally pulled all the way out. If this happens, cover the insertion site with a bandage or gauze dressing. Do not throw the PICC away. Your health care provider will need to inspect it.  Your PICC was tugged or pulled and has partially come out. Do not  push the PICC back in.  There is any type of drainage, redness, or swelling where the PICC enters the skin.  You cannot flush the PICC, it is difficult to flush, or the PICC leaks around the insertion site when it is flushed.  You hear a "flushing" sound when the PICC is flushed.  You have pain, discomfort, or numbness in your arm, shoulder, or jaw on the same side as the PICC.  You feel your heart "racing" or skipping beats.  You notice a hole or tear in the PICC.  You develop chills or a fever. MAKE SURE YOU:   Understand these instructions.  Will watch your condition.  Will get help right away if you are not doing well or get worse. Document Released: 10/29/2002 Document Revised: 09/08/2013 Document Reviewed: 12/30/2012 ExitCare Patient Information 2015 [employer]. This information is not intended to replace advice given to you by your health care provider. Make sure you discuss any questions you have with your health care provider.  

## 2014-03-16 NOTE — Progress Notes (Signed)
Patient here for labs. hgb 8.8. Patient set up for 2 u-packed rbc's [facility], labs faxed to [facility].

## 2014-03-16 NOTE — Procedures (Signed)
Pt dx: Anemia  Dr. Alvy Bimler, received 2 units of PRBCs, pt tolerated well, no signs of transfusion reaction. VSS. LUA PICC flushed, packed with heparin. Pt tolerated well. Pt a&ox4, in NAD at end of transfusion. Pt denies any further needs at this time. Pt ambulated to front lobby and went home with spouse.

## 2014-03-17 LAB — TYPE AND SCREEN
ABO/RH(D): O POS
Antibody Screen: NEGATIVE
UNIT DIVISION: 0
Unit division: 0

## 2014-03-18 ENCOUNTER — Telehealth: Payer: Self-pay | Admitting: Medical Oncology

## 2014-03-18 NOTE — Telephone Encounter (Signed)
Fax received from [facility] requesting UA & Culture to be completed on patient. Orders given to lab and patient informed of urine sample to be given with tomorrow's lab appts. No further questions at this time.

## 2014-03-19 ENCOUNTER — Other Ambulatory Visit (HOSPITAL_BASED_OUTPATIENT_CLINIC_OR_DEPARTMENT_OTHER): Payer: Medicare Other

## 2014-03-19 ENCOUNTER — Encounter: Payer: Self-pay | Admitting: Medical Oncology

## 2014-03-19 ENCOUNTER — Ambulatory Visit: Payer: Non-veteran care

## 2014-03-19 ENCOUNTER — Other Ambulatory Visit: Payer: Non-veteran care

## 2014-03-19 DIAGNOSIS — C9200 Acute myeloblastic leukemia, not having achieved remission: Secondary | ICD-10-CM

## 2014-03-19 DIAGNOSIS — D72819 Decreased white blood cell count, unspecified: Secondary | ICD-10-CM

## 2014-03-19 DIAGNOSIS — Z452 Encounter for adjustment and management of vascular access device: Secondary | ICD-10-CM

## 2014-03-19 LAB — CBC WITH DIFFERENTIAL/PLATELET
BASO%: 0 % (ref 0.0–2.0)
Basophils Absolute: 0 10*3/uL (ref 0.0–0.1)
EOS%: 3 % (ref 0.0–7.0)
Eosinophils Absolute: 0 10*3/uL (ref 0.0–0.5)
HCT: 31.8 % — ABNORMAL LOW (ref 38.4–49.9)
HGB: 10.4 g/dL — ABNORMAL LOW (ref 13.0–17.1)
LYMPH#: 0.4 10*3/uL — AB (ref 0.9–3.3)
LYMPH%: 62.1 % — AB (ref 14.0–49.0)
MCH: 29.6 pg (ref 27.2–33.4)
MCHC: 32.7 g/dL (ref 32.0–36.0)
MCV: 90.6 fL (ref 79.3–98.0)
MONO#: 0 10*3/uL — ABNORMAL LOW (ref 0.1–0.9)
MONO%: 3 % (ref 0.0–14.0)
NEUT#: 0.2 10*3/uL — CL (ref 1.5–6.5)
NEUT%: 31.9 % — ABNORMAL LOW (ref 39.0–75.0)
Platelets: 74 10*3/uL — ABNORMAL LOW (ref 140–400)
RBC: 3.51 10*6/uL — ABNORMAL LOW (ref 4.20–5.82)
RDW: 17.4 % — ABNORMAL HIGH (ref 11.0–14.6)
WBC: 0.7 10*3/uL — CL (ref 4.0–10.3)
nRBC: 0 % (ref 0–0)

## 2014-03-19 LAB — HOLD TUBE, BLOOD BANK

## 2014-03-19 MED ORDER — SODIUM CHLORIDE 0.9 % IJ SOLN
10.0000 mL | INTRAMUSCULAR | Status: DC | PRN
  Administered 2014-03-19: 10 mL via INTRAVENOUS
  Filled 2014-03-19: qty 10

## 2014-03-19 MED ORDER — HEPARIN SOD (PORK) LOCK FLUSH 100 UNIT/ML IV SOLN
500.0000 [IU] | Freq: Once | INTRAVENOUS | Status: AC
  Administered 2014-03-19: 250 [IU] via INTRAVENOUS
  Filled 2014-03-19: qty 5

## 2014-03-19 NOTE — Discharge Summary (Signed)
No discharge note needed.

## 2014-03-19 NOTE — Progress Notes (Signed)
Today's lab results faxed to [facility] per their request, to [phone].

## 2014-03-19 NOTE — Progress Notes (Signed)
Not an admission.

## 2014-03-19 NOTE — Patient Instructions (Signed)
PICC Home Guide A peripherally inserted central catheter (PICC) is a long, thin, flexible tube that is inserted into a vein in the upper arm. It is a form of intravenous (IV) access. It is considered to be a "central" line because the tip of the PICC ends in a large vein in your chest. This large vein is called the superior vena cava (SVC). The PICC tip ends in the SVC because there is a lot of blood flow in the SVC. This allows medicines and IV fluids to be quickly distributed throughout the body. The PICC is inserted using a sterile technique by a specially trained nurse or physician. After the PICC is inserted, a chest X-ray exam is done to be sure it is in the correct place.  A PICC may be placed for different reasons, such as:  To give medicines and liquid nutrition that can only be given through a central line. Examples are:  Certain antibiotic treatments.  Chemotherapy.  Total parenteral nutrition (TPN).  To take frequent blood samples.  To give IV fluids and blood products.  If there is difficulty placing a peripheral intravenous (PIV) catheter. If taken care of properly, a PICC can remain in place for several months. A PICC can also allow a person to go home from the hospital early. Medicine and PICC care can be managed at home by a family member or home health care team. WHAT PROBLEMS CAN HAPPEN WHEN I HAVE A PICC? Problems with a PICC can occasionally occur. These may include the following:  A blood clot (thrombus) forming in or at the tip of the PICC. This can cause the PICC to become clogged. A clot-dissolving medicine called tissue plasminogen activator (tPA) can be given through the PICC to help break up the clot.  Inflammation of the vein (phlebitis) in which the PICC is placed. Signs of inflammation may include redness, pain at the insertion site, red streaks, or being able to feel a "cord" in the vein where the PICC is located.  Infection in the PICC or at the insertion  site. Signs of infection may include fever, chills, redness, swelling, or pus drainage from the PICC insertion site.  PICC movement (malposition). The PICC tip may move from its original position due to excessive physical activity, forceful coughing, sneezing, or vomiting.  A break or cut in the PICC. It is important to not use scissors near the PICC.  Nerve or tendon irritation or injury during PICC insertion. WHAT SHOULD I KEEP IN MIND ABOUT ACTIVITIES WHEN I HAVE A PICC?  You may bend your arm and move it freely. If your PICC is near or at the bend of your elbow, avoid activity with repeated motion at the elbow.  Rest at home for the remainder of the day following PICC line insertion.  Avoid lifting heavy objects as instructed by your health care provider.  Avoid using a crutch with the arm on the same side as your PICC. You may need to use a walker. WHAT SHOULD I KNOW ABOUT MY PICC DRESSING?  Keep your PICC bandage (dressing) clean and dry to prevent infection.  Ask your health care provider when you may shower. Ask your health care provider to teach you how to wrap the PICC when you do take a shower.  Change the PICC dressing as instructed by your health care provider.  Change your PICC dressing if it becomes loose or wet. WHAT SHOULD I KNOW ABOUT PICC CARE?  Check the PICC insertion site   daily for leakage, redness, swelling, or pain.  Do not take a bath, swim, or use hot tubs when you have a PICC. Cover PICC line with clear plastic wrap and tape to keep it dry while showering.  Flush the PICC as directed by your health care provider. Let your health care provider know right away if the PICC is difficult to flush or does not flush. Do not use force to flush the PICC.  Do not use a syringe that is less than 10 mL to flush the PICC.  Never pull or tug on the PICC.  Avoid blood pressure checks on the arm with the PICC.  Keep your PICC identification card with you at all  times.  Do not take the PICC out yourself. Only a trained clinical professional should remove the PICC. SEEK IMMEDIATE MEDICAL CARE IF:  Your PICC is accidentally pulled all the way out. If this happens, cover the insertion site with a bandage or gauze dressing. Do not throw the PICC away. Your health care provider will need to inspect it.  Your PICC was tugged or pulled and has partially come out. Do not  push the PICC back in.  There is any type of drainage, redness, or swelling where the PICC enters the skin.  You cannot flush the PICC, it is difficult to flush, or the PICC leaks around the insertion site when it is flushed.  You hear a "flushing" sound when the PICC is flushed.  You have pain, discomfort, or numbness in your arm, shoulder, or jaw on the same side as the PICC.  You feel your heart "racing" or skipping beats.  You notice a hole or tear in the PICC.  You develop chills or a fever. MAKE SURE YOU:   Understand these instructions.  Will watch your condition.  Will get help right away if you are not doing well or get worse. Document Released: 10/29/2002 Document Revised: 09/08/2013 Document Reviewed: 12/30/2012 ExitCare Patient Information 2015 [employer]. This information is not intended to replace advice given to you by your health care provider. Make sure you discuss any questions you have with your health care provider.  

## 2014-03-26 ENCOUNTER — Other Ambulatory Visit: Payer: Self-pay | Admitting: *Deleted

## 2014-03-26 ENCOUNTER — Telehealth: Payer: Self-pay | Admitting: Oncology

## 2014-03-26 DIAGNOSIS — D72819 Decreased white blood cell count, unspecified: Secondary | ICD-10-CM

## 2014-03-26 NOTE — Telephone Encounter (Signed)
s.w. pt wife and advised on NOV and DEc appt...ok and aware

## 2014-04-01 ENCOUNTER — Ambulatory Visit (HOSPITAL_BASED_OUTPATIENT_CLINIC_OR_DEPARTMENT_OTHER): Payer: Medicare Other

## 2014-04-01 ENCOUNTER — Other Ambulatory Visit (HOSPITAL_BASED_OUTPATIENT_CLINIC_OR_DEPARTMENT_OTHER): Payer: Medicare Other

## 2014-04-01 VITALS — BP 153/94 | HR 80 | Temp 97.5°F

## 2014-04-01 DIAGNOSIS — D72819 Decreased white blood cell count, unspecified: Secondary | ICD-10-CM

## 2014-04-01 DIAGNOSIS — Z452 Encounter for adjustment and management of vascular access device: Secondary | ICD-10-CM

## 2014-04-01 DIAGNOSIS — C9200 Acute myeloblastic leukemia, not having achieved remission: Secondary | ICD-10-CM

## 2014-04-01 LAB — CBC WITH DIFFERENTIAL/PLATELET
BASO%: 0.3 % (ref 0.0–2.0)
Basophils Absolute: 0 10*3/uL (ref 0.0–0.1)
EOS%: 0.3 % (ref 0.0–7.0)
Eosinophils Absolute: 0 10*3/uL (ref 0.0–0.5)
HEMATOCRIT: 29.6 % — AB (ref 38.4–49.9)
HGB: 9.5 g/dL — ABNORMAL LOW (ref 13.0–17.1)
LYMPH%: 38 % (ref 14.0–49.0)
MCH: 29.5 pg (ref 27.2–33.4)
MCHC: 32.1 g/dL (ref 32.0–36.0)
MCV: 91.9 fL (ref 79.3–98.0)
MONO#: 0.2 10*3/uL (ref 0.1–0.9)
MONO%: 6.1 % (ref 0.0–14.0)
NEUT#: 1.6 10*3/uL (ref 1.5–6.5)
NEUT%: 55.3 % (ref 39.0–75.0)
Platelets: 65 10*3/uL — ABNORMAL LOW (ref 140–400)
RBC: 3.22 10*6/uL — AB (ref 4.20–5.82)
RDW: 17.2 % — ABNORMAL HIGH (ref 11.0–14.6)
WBC: 3 10*3/uL — AB (ref 4.0–10.3)
lymph#: 1.1 10*3/uL (ref 0.9–3.3)

## 2014-04-01 LAB — COMPREHENSIVE METABOLIC PANEL (CC13)
ALT: <6 U/L (ref 0–55)
ANION GAP: 7 meq/L (ref 3–11)
AST: 9 U/L (ref 5–34)
Albumin: 2.3 g/dL — ABNORMAL LOW (ref 3.5–5.0)
Alkaline Phosphatase: 69 U/L (ref 40–150)
BILIRUBIN TOTAL: 0.54 mg/dL (ref 0.20–1.20)
BUN: 13.6 mg/dL (ref 7.0–26.0)
CO2: 28 meq/L (ref 22–29)
CREATININE: 1.1 mg/dL (ref 0.7–1.3)
Calcium: 7.9 mg/dL — ABNORMAL LOW (ref 8.4–10.4)
Chloride: 108 mEq/L (ref 98–109)
Glucose: 81 mg/dl (ref 70–140)
Potassium: 3.3 mEq/L — ABNORMAL LOW (ref 3.5–5.1)
SODIUM: 143 meq/L (ref 136–145)
Total Protein: 5.3 g/dL — ABNORMAL LOW (ref 6.4–8.3)

## 2014-04-01 LAB — MAGNESIUM (CC13): Magnesium: 1.7 mg/dl (ref 1.5–2.5)

## 2014-04-01 MED ORDER — HEPARIN SOD (PORK) LOCK FLUSH 100 UNIT/ML IV SOLN
500.0000 [IU] | Freq: Once | INTRAVENOUS | Status: AC
  Administered 2014-04-01: 500 [IU] via INTRAVENOUS
  Filled 2014-04-01: qty 5

## 2014-04-01 MED ORDER — SODIUM CHLORIDE 0.9 % IJ SOLN
10.0000 mL | INTRAMUSCULAR | Status: DC | PRN
  Administered 2014-04-01: 10 mL via INTRAVENOUS
  Filled 2014-04-01: qty 10

## 2014-04-01 NOTE — Patient Instructions (Signed)
PICC Home Guide A peripherally inserted central catheter (PICC) is a long, thin, flexible tube that is inserted into a vein in the upper arm. It is a form of intravenous (IV) access. It is considered to be a "central" line because the tip of the PICC ends in a large vein in your chest. This large vein is called the superior vena cava (SVC). The PICC tip ends in the SVC because there is a lot of blood flow in the SVC. This allows medicines and IV fluids to be quickly distributed throughout the body. The PICC is inserted using a sterile technique by a specially trained nurse or physician. After the PICC is inserted, a chest X-ray exam is done to be sure it is in the correct place.  A PICC may be placed for different reasons, such as:  To give medicines and liquid nutrition that can only be given through a central line. Examples are:  Certain antibiotic treatments.  Chemotherapy.  Total parenteral nutrition (TPN).  To take frequent blood samples.  To give IV fluids and blood products.  If there is difficulty placing a peripheral intravenous (PIV) catheter. If taken care of properly, a PICC can remain in place for several months. A PICC can also allow a person to go home from the hospital early. Medicine and PICC care can be managed at home by a family member or home health care team. WHAT PROBLEMS CAN HAPPEN WHEN I HAVE A PICC? Problems with a PICC can occasionally occur. These may include the following:  A blood clot (thrombus) forming in or at the tip of the PICC. This can cause the PICC to become clogged. A clot-dissolving medicine called tissue plasminogen activator (tPA) can be given through the PICC to help break up the clot.  Inflammation of the vein (phlebitis) in which the PICC is placed. Signs of inflammation may include redness, pain at the insertion site, red streaks, or being able to feel a "cord" in the vein where the PICC is located.  Infection in the PICC or at the insertion  site. Signs of infection may include fever, chills, redness, swelling, or pus drainage from the PICC insertion site.  PICC movement (malposition). The PICC tip may move from its original position due to excessive physical activity, forceful coughing, sneezing, or vomiting.  A break or cut in the PICC. It is important to not use scissors near the PICC.  Nerve or tendon irritation or injury during PICC insertion. WHAT SHOULD I KEEP IN MIND ABOUT ACTIVITIES WHEN I HAVE A PICC?  You may bend your arm and move it freely. If your PICC is near or at the bend of your elbow, avoid activity with repeated motion at the elbow.  Rest at home for the remainder of the day following PICC line insertion.  Avoid lifting heavy objects as instructed by your health care provider.  Avoid using a crutch with the arm on the same side as your PICC. You may need to use a walker. WHAT SHOULD I KNOW ABOUT MY PICC DRESSING?  Keep your PICC bandage (dressing) clean and dry to prevent infection.  Ask your health care provider when you may shower. Ask your health care provider to teach you how to wrap the PICC when you do take a shower.  Change the PICC dressing as instructed by your health care provider.  Change your PICC dressing if it becomes loose or wet. WHAT SHOULD I KNOW ABOUT PICC CARE?  Check the PICC insertion site   daily for leakage, redness, swelling, or pain.  Do not take a bath, swim, or use hot tubs when you have a PICC. Cover PICC line with clear plastic wrap and tape to keep it dry while showering.  Flush the PICC as directed by your health care provider. Let your health care provider know right away if the PICC is difficult to flush or does not flush. Do not use force to flush the PICC.  Do not use a syringe that is less than 10 mL to flush the PICC.  Never pull or tug on the PICC.  Avoid blood pressure checks on the arm with the PICC.  Keep your PICC identification card with you at all  times.  Do not take the PICC out yourself. Only a trained clinical professional should remove the PICC. SEEK IMMEDIATE MEDICAL CARE IF:  Your PICC is accidentally pulled all the way out. If this happens, cover the insertion site with a bandage or gauze dressing. Do not throw the PICC away. Your health care provider will need to inspect it.  Your PICC was tugged or pulled and has partially come out. Do not  push the PICC back in.  There is any type of drainage, redness, or swelling where the PICC enters the skin.  You cannot flush the PICC, it is difficult to flush, or the PICC leaks around the insertion site when it is flushed.  You hear a "flushing" sound when the PICC is flushed.  You have pain, discomfort, or numbness in your arm, shoulder, or jaw on the same side as the PICC.  You feel your heart "racing" or skipping beats.  You notice a hole or tear in the PICC.  You develop chills or a fever. MAKE SURE YOU:   Understand these instructions.  Will watch your condition.  Will get help right away if you are not doing well or get worse. Document Released: 10/29/2002 Document Revised: 09/08/2013 Document Reviewed: 12/30/2012 ExitCare Patient Information 2015 [employer]. This information is not intended to replace advice given to you by your health care provider. Make sure you discuss any questions you have with your health care provider.  

## 2014-04-06 ENCOUNTER — Ambulatory Visit (HOSPITAL_BASED_OUTPATIENT_CLINIC_OR_DEPARTMENT_OTHER): Payer: Medicare Other

## 2014-04-06 ENCOUNTER — Other Ambulatory Visit: Payer: Self-pay | Admitting: *Deleted

## 2014-04-06 ENCOUNTER — Other Ambulatory Visit (HOSPITAL_BASED_OUTPATIENT_CLINIC_OR_DEPARTMENT_OTHER): Payer: Medicare Other

## 2014-04-06 ENCOUNTER — Encounter: Payer: Self-pay | Admitting: *Deleted

## 2014-04-06 ENCOUNTER — Non-Acute Institutional Stay (HOSPITAL_COMMUNITY)
Admission: AD | Admit: 2014-04-06 | Discharge: 2014-04-06 | Disposition: A | Discharge status: Home / Self Care | Payer: Non-veteran care | Source: Ambulatory Visit | Attending: Oncology | Admitting: Oncology

## 2014-04-06 DIAGNOSIS — C9200 Acute myeloblastic leukemia, not having achieved remission: Secondary | ICD-10-CM

## 2014-04-06 DIAGNOSIS — D649 Anemia, unspecified: Secondary | ICD-10-CM | POA: Diagnosis present

## 2014-04-06 DIAGNOSIS — D72819 Decreased white blood cell count, unspecified: Secondary | ICD-10-CM

## 2014-04-06 DIAGNOSIS — Z452 Encounter for adjustment and management of vascular access device: Secondary | ICD-10-CM

## 2014-04-06 LAB — CBC WITH DIFFERENTIAL/PLATELET
BASO%: 0.9 % (ref 0.0–2.0)
BASOS ABS: 0 10*3/uL (ref 0.0–0.1)
EOS ABS: 0 10*3/uL (ref 0.0–0.5)
EOS%: 1.8 % (ref 0.0–7.0)
HEMATOCRIT: 24.3 % — AB (ref 38.4–49.9)
HGB: 7.8 g/dL — ABNORMAL LOW (ref 13.0–17.1)
LYMPH#: 0.7 10*3/uL — AB (ref 0.9–3.3)
LYMPH%: 40.4 % (ref 14.0–49.0)
MCH: 29.6 pg (ref 27.2–33.4)
MCHC: 32.2 g/dL (ref 32.0–36.0)
MCV: 92.1 fL (ref 79.3–98.0)
MONO#: 0.1 10*3/uL (ref 0.1–0.9)
MONO%: 3.3 % (ref 0.0–14.0)
NEUT%: 53.6 % (ref 39.0–75.0)
NEUTROS ABS: 0.9 10*3/uL — AB (ref 1.5–6.5)
Platelets: 33 10*3/uL — ABNORMAL LOW (ref 140–400)
RBC: 2.64 10*6/uL — ABNORMAL LOW (ref 4.20–5.82)
RDW: 18.8 % — AB (ref 11.0–14.6)
WBC: 1.6 10*3/uL — ABNORMAL LOW (ref 4.0–10.3)

## 2014-04-06 LAB — COMPREHENSIVE METABOLIC PANEL (CC13)
ALT: <6 U/L (ref 0–55)
AST: 10 U/L (ref 5–34)
Albumin: 2.2 g/dL — ABNORMAL LOW (ref 3.5–5.0)
Alkaline Phosphatase: 61 U/L (ref 40–150)
Anion Gap: 8 mEq/L (ref 3–11)
BUN: 13.6 mg/dL (ref 7.0–26.0)
CALCIUM: 7.7 mg/dL — AB (ref 8.4–10.4)
CHLORIDE: 113 meq/L — AB (ref 98–109)
CO2: 25 mEq/L (ref 22–29)
CREATININE: 1.2 mg/dL (ref 0.7–1.3)
Glucose: 87 mg/dl (ref 70–140)
Potassium: 3.4 mEq/L — ABNORMAL LOW (ref 3.5–5.1)
Sodium: 147 mEq/L — ABNORMAL HIGH (ref 136–145)
Total Bilirubin: 0.46 mg/dL (ref 0.20–1.20)
Total Protein: 5.2 g/dL — ABNORMAL LOW (ref 6.4–8.3)

## 2014-04-06 LAB — PREPARE RBC (CROSSMATCH)

## 2014-04-06 LAB — HOLD TUBE, BLOOD BANK

## 2014-04-06 LAB — MAGNESIUM (CC13): MAGNESIUM: 1.8 mg/dL (ref 1.5–2.5)

## 2014-04-06 MED ORDER — SODIUM CHLORIDE 0.9 % IV SOLN
250.0000 mL | Freq: Once | INTRAVENOUS | Status: AC
  Administered 2014-04-06: 250 mL via INTRAVENOUS

## 2014-04-06 MED ORDER — HEPARIN SOD (PORK) LOCK FLUSH 100 UNIT/ML IV SOLN
500.0000 [IU] | Freq: Once | INTRAVENOUS | Status: AC
  Administered 2014-04-06: 250 [IU] via INTRAVENOUS
  Filled 2014-04-06: qty 5

## 2014-04-06 MED ORDER — HEPARIN SOD (PORK) LOCK FLUSH 100 UNIT/ML IV SOLN
500.0000 [IU] | Freq: Every day | INTRAVENOUS | Status: AC | PRN
  Administered 2014-04-06: 500 [IU]
  Filled 2014-04-06: qty 5

## 2014-04-06 MED ORDER — DIPHENHYDRAMINE HCL 25 MG PO CAPS
25.0000 mg | ORAL_CAPSULE | Freq: Once | ORAL | Status: AC
  Administered 2014-04-06: 25 mg via ORAL
  Filled 2014-04-06: qty 1

## 2014-04-06 MED ORDER — ACETAMINOPHEN 325 MG PO TABS
650.0000 mg | ORAL_TABLET | Freq: Once | ORAL | Status: AC
  Administered 2014-04-06: 650 mg via ORAL
  Filled 2014-04-06: qty 2

## 2014-04-06 MED ORDER — SODIUM CHLORIDE 0.9 % IJ SOLN
10.0000 mL | INTRAMUSCULAR | Status: DC | PRN
  Administered 2014-04-06: 10 mL via INTRAVENOUS
  Filled 2014-04-06: qty 10

## 2014-04-06 MED ORDER — SODIUM CHLORIDE 0.9 % IJ SOLN
10.0000 mL | INTRAMUSCULAR | Status: AC | PRN
  Administered 2014-04-06: 10 mL

## 2014-04-06 NOTE — Procedures (Signed)
[facility]  Procedure Note  [name]:[id number] DOB: Jun 23, 1940 DOA: 04/06/2014   Dr. Alen Blew  Associated Diagnosis: Anemia, unspecified amenia type  Procedure Note: 2 Units of PRBC's transfused per order via PICC line that was in place on admission.   Condition During Procedure: Patient tolerated procedure well without any complaints   Condition at Discharge: patient stable at discharge, ambulatory out of clinic, spouse at side.   [name], RN  [facility]

## 2014-04-07 LAB — TYPE AND SCREEN
ABO/RH(D): O POS
Antibody Screen: NEGATIVE
UNIT DIVISION: 0
UNIT DIVISION: 0

## 2014-04-09 ENCOUNTER — Other Ambulatory Visit (HOSPITAL_BASED_OUTPATIENT_CLINIC_OR_DEPARTMENT_OTHER): Payer: Medicare Other

## 2014-04-09 ENCOUNTER — Ambulatory Visit (HOSPITAL_BASED_OUTPATIENT_CLINIC_OR_DEPARTMENT_OTHER): Payer: Medicare Other

## 2014-04-09 VITALS — BP 167/72 | HR 70 | Temp 97.6°F

## 2014-04-09 DIAGNOSIS — D72819 Decreased white blood cell count, unspecified: Secondary | ICD-10-CM

## 2014-04-09 DIAGNOSIS — C9200 Acute myeloblastic leukemia, not having achieved remission: Secondary | ICD-10-CM

## 2014-04-09 DIAGNOSIS — Z452 Encounter for adjustment and management of vascular access device: Secondary | ICD-10-CM

## 2014-04-09 LAB — CBC WITH DIFFERENTIAL/PLATELET
BASO%: 0.7 % (ref 0.0–2.0)
Basophils Absolute: 0 10*3/uL (ref 0.0–0.1)
EOS ABS: 0 10*3/uL (ref 0.0–0.5)
EOS%: 2.3 % (ref 0.0–7.0)
HEMATOCRIT: 28.1 % — AB (ref 38.4–49.9)
HGB: 9.1 g/dL — ABNORMAL LOW (ref 13.0–17.1)
LYMPH#: 0.5 10*3/uL — AB (ref 0.9–3.3)
LYMPH%: 44.3 % (ref 14.0–49.0)
MCH: 29 pg (ref 27.2–33.4)
MCHC: 32.3 g/dL (ref 32.0–36.0)
MCV: 89.8 fL (ref 79.3–98.0)
MONO#: 0 10*3/uL — AB (ref 0.1–0.9)
MONO%: 1.9 % (ref 0.0–14.0)
NEUT%: 50.8 % (ref 39.0–75.0)
NEUTROS ABS: 0.5 10*3/uL — AB (ref 1.5–6.5)
Platelets: 22 10*3/uL — ABNORMAL LOW (ref 140–400)
RBC: 3.13 10*6/uL — ABNORMAL LOW (ref 4.20–5.82)
RDW: 18.6 % — ABNORMAL HIGH (ref 11.0–14.6)
WBC: 1.1 10*3/uL — AB (ref 4.0–10.3)

## 2014-04-09 LAB — COMPREHENSIVE METABOLIC PANEL (CC13)
ALT: 6 U/L (ref 0–55)
AST: 8 U/L (ref 5–34)
Albumin: 2.2 g/dL — ABNORMAL LOW (ref 3.5–5.0)
Alkaline Phosphatase: 64 U/L (ref 40–150)
Anion Gap: 9 mEq/L (ref 3–11)
BUN: 14.8 mg/dL (ref 7.0–26.0)
CO2: 24 meq/L (ref 22–29)
CREATININE: 1 mg/dL (ref 0.7–1.3)
Calcium: 7.9 mg/dL — ABNORMAL LOW (ref 8.4–10.4)
Chloride: 111 mEq/L — ABNORMAL HIGH (ref 98–109)
EGFR: 78 mL/min/{1.73_m2} — ABNORMAL LOW (ref >90)
GLUCOSE: 97 mg/dL (ref 70–140)
Potassium: 3.2 mEq/L — ABNORMAL LOW (ref 3.5–5.1)
SODIUM: 144 meq/L (ref 136–145)
Total Bilirubin: 0.48 mg/dL (ref 0.20–1.20)
Total Protein: 5.3 g/dL — ABNORMAL LOW (ref 6.4–8.3)

## 2014-04-09 MED ORDER — SODIUM CHLORIDE 0.9 % IJ SOLN
10.0000 mL | INTRAMUSCULAR | Status: DC | PRN
  Administered 2014-04-09: 10 mL via INTRAVENOUS
  Filled 2014-04-09: qty 10

## 2014-04-09 MED ORDER — HEPARIN SOD (PORK) LOCK FLUSH 100 UNIT/ML IV SOLN
500.0000 [IU] | Freq: Once | INTRAVENOUS | Status: AC
  Administered 2014-04-09: 500 [IU] via INTRAVENOUS
  Filled 2014-04-09: qty 5

## 2014-04-09 NOTE — Patient Instructions (Addendum)
PICC Home Guide A peripherally inserted central catheter (PICC) is a long, thin, flexible tube that is inserted into a vein in the upper arm. It is a form of intravenous (IV) access. It is considered to be a "central" line because the tip of the PICC ends in a large vein in your chest. This large vein is called the superior vena cava (SVC). The PICC tip ends in the SVC because there is a lot of blood flow in the SVC. This allows medicines and IV fluids to be quickly distributed throughout the body. The PICC is inserted using a sterile technique by a specially trained nurse or physician. After the PICC is inserted, a chest X-ray exam is done to be sure it is in the correct place.  A PICC may be placed for different reasons, such as:  To give medicines and liquid nutrition that can only be given through a central line. Examples are:  Certain antibiotic treatments.  Chemotherapy.  Total parenteral nutrition (TPN).  To take frequent blood samples.  To give IV fluids and blood products.  If there is difficulty placing a peripheral intravenous (PIV) catheter. If taken care of properly, a PICC can remain in place for several months. A PICC can also allow a person to go home from the hospital early. Medicine and PICC care can be managed at home by a family member or home health care team. WHAT PROBLEMS CAN HAPPEN WHEN I HAVE A PICC? Problems with a PICC can occasionally occur. These may include the following:  A blood clot (thrombus) forming in or at the tip of the PICC. This can cause the PICC to become clogged. A clot-dissolving medicine called tissue plasminogen activator (tPA) can be given through the PICC to help break up the clot.  Inflammation of the vein (phlebitis) in which the PICC is placed. Signs of inflammation may include redness, pain at the insertion site, red streaks, or being able to feel a "cord" in the vein where the PICC is located.  Infection in the PICC or at the insertion  site. Signs of infection may include fever, chills, redness, swelling, or pus drainage from the PICC insertion site.  PICC movement (malposition). The PICC tip may move from its original position due to excessive physical activity, forceful coughing, sneezing, or vomiting.  A break or cut in the PICC. It is important to not use scissors near the PICC.  Nerve or tendon irritation or injury during PICC insertion. WHAT SHOULD I KEEP IN MIND ABOUT ACTIVITIES WHEN I HAVE A PICC?  You may bend your arm and move it freely. If your PICC is near or at the bend of your elbow, avoid activity with repeated motion at the elbow.  Rest at home for the remainder of the day following PICC line insertion.  Avoid lifting heavy objects as instructed by your health care provider.  Avoid using a crutch with the arm on the same side as your PICC. You may need to use a walker. WHAT SHOULD I KNOW ABOUT MY PICC DRESSING?  Keep your PICC bandage (dressing) clean and dry to prevent infection.  Ask your health care provider when you may shower. Ask your health care provider to teach you how to wrap the PICC when you do take a shower.  Change the PICC dressing as instructed by your health care provider.  Change your PICC dressing if it becomes loose or wet. WHAT SHOULD I KNOW ABOUT PICC CARE?  Check the PICC insertion site   daily for leakage, redness, swelling, or pain.  Do not take a bath, swim, or use hot tubs when you have a PICC. Cover PICC line with clear plastic wrap and tape to keep it dry while showering.  Flush the PICC as directed by your health care provider. Let your health care provider know right away if the PICC is difficult to flush or does not flush. Do not use force to flush the PICC.  Do not use a syringe that is less than 10 mL to flush the PICC.  Never pull or tug on the PICC.  Avoid blood pressure checks on the arm with the PICC.  Keep your PICC identification card with you at all  times.  Do not take the PICC out yourself. Only a trained clinical professional should remove the PICC. SEEK IMMEDIATE MEDICAL CARE IF:  Your PICC is accidentally pulled all the way out. If this happens, cover the insertion site with a bandage or gauze dressing. Do not throw the PICC away. Your health care provider will need to inspect it.  Your PICC was tugged or pulled and has partially come out. Do not  push the PICC back in.  There is any type of drainage, redness, or swelling where the PICC enters the skin.  You cannot flush the PICC, it is difficult to flush, or the PICC leaks around the insertion site when it is flushed.  You hear a "flushing" sound when the PICC is flushed.  You have pain, discomfort, or numbness in your arm, shoulder, or jaw on the same side as the PICC.  You feel your heart "racing" or skipping beats.  You notice a hole or tear in the PICC.  You develop chills or a fever. MAKE SURE YOU:   Understand these instructions.  Will watch your condition.  Will get help right away if you are not doing well or get worse. Document Released: 10/29/2002 Document Revised: 09/08/2013 Document Reviewed: 12/30/2012 [employer] Patient Information 2015 [employer]. This information is not intended to replace advice given to you by your health care provider. Make sure you discuss any questions you have with your health care provider.  

## 2014-04-13 ENCOUNTER — Other Ambulatory Visit (HOSPITAL_BASED_OUTPATIENT_CLINIC_OR_DEPARTMENT_OTHER): Payer: Medicare Other

## 2014-04-13 ENCOUNTER — Ambulatory Visit (HOSPITAL_BASED_OUTPATIENT_CLINIC_OR_DEPARTMENT_OTHER): Payer: Medicare Other

## 2014-04-13 ENCOUNTER — Telehealth: Payer: Self-pay | Admitting: *Deleted

## 2014-04-13 VITALS — BP 173/81 | HR 85 | Temp 98.0°F

## 2014-04-13 DIAGNOSIS — Z452 Encounter for adjustment and management of vascular access device: Secondary | ICD-10-CM

## 2014-04-13 DIAGNOSIS — C9200 Acute myeloblastic leukemia, not having achieved remission: Secondary | ICD-10-CM

## 2014-04-13 DIAGNOSIS — D72819 Decreased white blood cell count, unspecified: Secondary | ICD-10-CM

## 2014-04-13 LAB — COMPREHENSIVE METABOLIC PANEL (CC13)
ALK PHOS: 61 U/L (ref 40–150)
ALT: 8 U/L (ref 0–55)
AST: 7 U/L (ref 5–34)
Albumin: 2.3 g/dL — ABNORMAL LOW (ref 3.5–5.0)
Anion Gap: 10 mEq/L (ref 3–11)
BILIRUBIN TOTAL: 0.59 mg/dL (ref 0.20–1.20)
BUN: 14.3 mg/dL (ref 7.0–26.0)
CO2: 26 mEq/L (ref 22–29)
Calcium: 8.1 mg/dL — ABNORMAL LOW (ref 8.4–10.4)
Chloride: 109 mEq/L (ref 98–109)
Creatinine: 1.1 mg/dL (ref 0.7–1.3)
EGFR: 68 mL/min/{1.73_m2} — ABNORMAL LOW (ref >90)
Glucose: 129 mg/dl (ref 70–140)
Potassium: 2.9 mEq/L — CL (ref 3.5–5.1)
Sodium: 144 mEq/L (ref 136–145)
Total Protein: 5.4 g/dL — ABNORMAL LOW (ref 6.4–8.3)

## 2014-04-13 LAB — CBC WITH DIFFERENTIAL/PLATELET
BASO%: 2 % (ref 0.0–2.0)
Basophils Absolute: 0 10*3/uL (ref 0.0–0.1)
EOS%: 2 % (ref 0.0–7.0)
Eosinophils Absolute: 0 10*3/uL (ref 0.0–0.5)
HCT: 25.7 % — ABNORMAL LOW (ref 38.4–49.9)
HGB: 8.3 g/dL — ABNORMAL LOW (ref 13.0–17.1)
LYMPH%: 78.4 % — ABNORMAL HIGH (ref 14.0–49.0)
MCH: 29.1 pg (ref 27.2–33.4)
MCHC: 32.3 g/dL (ref 32.0–36.0)
MCV: 90.2 fL (ref 79.3–98.0)
MONO#: 0 10*3/uL — ABNORMAL LOW (ref 0.1–0.9)
MONO%: 2 % (ref 0.0–14.0)
NEUT#: 0.1 10*3/uL — CL (ref 1.5–6.5)
NEUT%: 15.6 % — ABNORMAL LOW (ref 39.0–75.0)
NRBC: 0 % (ref 0–0)
Platelets: 18 10*3/uL — ABNORMAL LOW (ref 140–400)
RBC: 2.85 10*6/uL — ABNORMAL LOW (ref 4.20–5.82)
RDW: 19.1 % — AB (ref 11.0–14.6)
WBC: 0.5 10*3/uL — CL (ref 4.0–10.3)
lymph#: 0.4 10*3/uL — ABNORMAL LOW (ref 0.9–3.3)

## 2014-04-13 LAB — MAGNESIUM (CC13): MAGNESIUM: 1.5 mg/dL (ref 1.5–2.5)

## 2014-04-13 MED ORDER — SODIUM CHLORIDE 0.9 % IJ SOLN
10.0000 mL | INTRAMUSCULAR | Status: DC | PRN
  Administered 2014-04-13: 10 mL via INTRAVENOUS
  Filled 2014-04-13: qty 10

## 2014-04-13 MED ORDER — HEPARIN SOD (PORK) LOCK FLUSH 100 UNIT/ML IV SOLN
500.0000 [IU] | Freq: Once | INTRAVENOUS | Status: AC
  Administered 2014-04-13: 500 [IU] via INTRAVENOUS
  Filled 2014-04-13: qty 5

## 2014-04-13 NOTE — Patient Instructions (Signed)
PICC Home Guide A peripherally inserted central catheter (PICC) is a long, thin, flexible tube that is inserted into a vein in the upper arm. It is a form of intravenous (IV) access. It is considered to be a "central" line because the tip of the PICC ends in a large vein in your chest. This large vein is called the superior vena cava (SVC). The PICC tip ends in the SVC because there is a lot of blood flow in the SVC. This allows medicines and IV fluids to be quickly distributed throughout the body. The PICC is inserted using a sterile technique by a specially trained nurse or physician. After the PICC is inserted, a chest X-ray exam is done to be sure it is in the correct place.  A PICC may be placed for different reasons, such as:  To give medicines and liquid nutrition that can only be given through a central line. Examples are:  Certain antibiotic treatments.  Chemotherapy.  Total parenteral nutrition (TPN).  To take frequent blood samples.  To give IV fluids and blood products.  If there is difficulty placing a peripheral intravenous (PIV) catheter. If taken care of properly, a PICC can remain in place for several months. A PICC can also allow a person to go home from the hospital early. Medicine and PICC care can be managed at home by a family member or home health care team. WHAT PROBLEMS CAN HAPPEN WHEN I HAVE A PICC? Problems with a PICC can occasionally occur. These may include the following:  A blood clot (thrombus) forming in or at the tip of the PICC. This can cause the PICC to become clogged. A clot-dissolving medicine called tissue plasminogen activator (tPA) can be given through the PICC to help break up the clot.  Inflammation of the vein (phlebitis) in which the PICC is placed. Signs of inflammation may include redness, pain at the insertion site, red streaks, or being able to feel a "cord" in the vein where the PICC is located.  Infection in the PICC or at the insertion  site. Signs of infection may include fever, chills, redness, swelling, or pus drainage from the PICC insertion site.  PICC movement (malposition). The PICC tip may move from its original position due to excessive physical activity, forceful coughing, sneezing, or vomiting.  A break or cut in the PICC. It is important to not use scissors near the PICC.  Nerve or tendon irritation or injury during PICC insertion. WHAT SHOULD I KEEP IN MIND ABOUT ACTIVITIES WHEN I HAVE A PICC?  You may bend your arm and move it freely. If your PICC is near or at the bend of your elbow, avoid activity with repeated motion at the elbow.  Rest at home for the remainder of the day following PICC line insertion.  Avoid lifting heavy objects as instructed by your health care provider.  Avoid using a crutch with the arm on the same side as your PICC. You may need to use a walker. WHAT SHOULD I KNOW ABOUT MY PICC DRESSING?  Keep your PICC bandage (dressing) clean and dry to prevent infection.  Ask your health care provider when you may shower. Ask your health care provider to teach you how to wrap the PICC when you do take a shower.  Change the PICC dressing as instructed by your health care provider.  Change your PICC dressing if it becomes loose or wet. WHAT SHOULD I KNOW ABOUT PICC CARE?  Check the PICC insertion site   daily for leakage, redness, swelling, or pain.  Do not take a bath, swim, or use hot tubs when you have a PICC. Cover PICC line with clear plastic wrap and tape to keep it dry while showering.  Flush the PICC as directed by your health care provider. Let your health care provider know right away if the PICC is difficult to flush or does not flush. Do not use force to flush the PICC.  Do not use a syringe that is less than 10 mL to flush the PICC.  Never pull or tug on the PICC.  Avoid blood pressure checks on the arm with the PICC.  Keep your PICC identification card with you at all  times.  Do not take the PICC out yourself. Only a trained clinical professional should remove the PICC. SEEK IMMEDIATE MEDICAL CARE IF:  Your PICC is accidentally pulled all the way out. If this happens, cover the insertion site with a bandage or gauze dressing. Do not throw the PICC away. Your health care provider will need to inspect it.  Your PICC was tugged or pulled and has partially come out. Do not  push the PICC back in.  There is any type of drainage, redness, or swelling where the PICC enters the skin.  You cannot flush the PICC, it is difficult to flush, or the PICC leaks around the insertion site when it is flushed.  You hear a "flushing" sound when the PICC is flushed.  You have pain, discomfort, or numbness in your arm, shoulder, or jaw on the same side as the PICC.  You feel your heart "racing" or skipping beats.  You notice a hole or tear in the PICC.  You develop chills or a fever. MAKE SURE YOU:   Understand these instructions.  Will watch your condition.  Will get help right away if you are not doing well or get worse. Document Released: 10/29/2002 Document Revised: 09/08/2013 Document Reviewed: 12/30/2012 ExitCare Patient Information 2015 [employer]. This information is not intended to replace advice given to you by your health care provider. Make sure you discuss any questions you have with your health care provider.  

## 2014-04-13 NOTE — Telephone Encounter (Signed)
CBC,CMET, AND MAGNESIUM SHOWN TO DR.MAGRINAT. VERBAL ORDER AND READ BACK TO DR.MAGRINAT- POTASSIUM 2.9/ RESUME POTASSIUM 10MEQ TAKE ONE TABLET TWICE A DAY UNTIL NEXT VISIT. ALSO FAXED CBC,CMET, AND MAGNESIUM RESULTS TO [place]BAYARD POWELL.

## 2014-04-14 ENCOUNTER — Ambulatory Visit: Payer: Non-veteran care

## 2014-04-14 ENCOUNTER — Ambulatory Visit (HOSPITAL_BASED_OUTPATIENT_CLINIC_OR_DEPARTMENT_OTHER): Payer: Medicare Other

## 2014-04-14 ENCOUNTER — Ambulatory Visit (HOSPITAL_COMMUNITY)
Admission: RE | Admit: 2014-04-14 | Discharge: 2014-04-14 | Disposition: A | Discharge status: Home / Self Care | Payer: Medicare Other | Source: Ambulatory Visit | Attending: Oncology | Admitting: Oncology

## 2014-04-14 ENCOUNTER — Other Ambulatory Visit: Payer: Self-pay | Admitting: Medical Oncology

## 2014-04-14 VITALS — BP 145/60 | HR 74 | Temp 98.6°F | Resp 18

## 2014-04-14 DIAGNOSIS — T451X5A Adverse effect of antineoplastic and immunosuppressive drugs, initial encounter: Secondary | ICD-10-CM

## 2014-04-14 DIAGNOSIS — D72819 Decreased white blood cell count, unspecified: Secondary | ICD-10-CM

## 2014-04-14 DIAGNOSIS — Z452 Encounter for adjustment and management of vascular access device: Secondary | ICD-10-CM

## 2014-04-14 DIAGNOSIS — D6481 Anemia due to antineoplastic chemotherapy: Secondary | ICD-10-CM

## 2014-04-14 DIAGNOSIS — C9200 Acute myeloblastic leukemia, not having achieved remission: Secondary | ICD-10-CM

## 2014-04-14 LAB — HOLD TUBE, BLOOD BANK

## 2014-04-14 LAB — PREPARE RBC (CROSSMATCH)

## 2014-04-14 MED ORDER — ACETAMINOPHEN 325 MG PO TABS
650.0000 mg | ORAL_TABLET | Freq: Once | ORAL | Status: AC
  Administered 2014-04-14: 650 mg via ORAL

## 2014-04-14 MED ORDER — DIPHENHYDRAMINE HCL 25 MG PO CAPS
25.0000 mg | ORAL_CAPSULE | Freq: Once | ORAL | Status: AC
  Administered 2014-04-14: 25 mg via ORAL

## 2014-04-14 MED ORDER — HEPARIN SOD (PORK) LOCK FLUSH 100 UNIT/ML IV SOLN
250.0000 [IU] | INTRAVENOUS | Status: AC | PRN
  Administered 2014-04-14: 250 [IU]
  Filled 2014-04-14: qty 5

## 2014-04-14 MED ORDER — SODIUM CHLORIDE 0.9 % IJ SOLN
10.0000 mL | INTRAMUSCULAR | Status: DC | PRN
  Administered 2014-04-14: 10 mL via INTRAVENOUS
  Filled 2014-04-14: qty 10

## 2014-04-14 MED ORDER — HEPARIN SOD (PORK) LOCK FLUSH 100 UNIT/ML IV SOLN
500.0000 [IU] | Freq: Once | INTRAVENOUS | Status: AC
  Administered 2014-04-14: 500 [IU] via INTRAVENOUS
  Filled 2014-04-14: qty 5

## 2014-04-14 MED ORDER — SODIUM CHLORIDE 0.9 % IV SOLN
250.0000 mL | Freq: Once | INTRAVENOUS | Status: AC
  Administered 2014-04-14: 250 mL via INTRAVENOUS

## 2014-04-14 MED ORDER — ACETAMINOPHEN 325 MG PO TABS
ORAL_TABLET | ORAL | Status: AC
  Filled 2014-04-14: qty 2

## 2014-04-14 MED ORDER — DIPHENHYDRAMINE HCL 25 MG PO CAPS
ORAL_CAPSULE | ORAL | Status: AC
  Filled 2014-04-14: qty 1

## 2014-04-14 MED ORDER — SODIUM CHLORIDE 0.9 % IJ SOLN
10.0000 mL | INTRAMUSCULAR | Status: AC | PRN
  Administered 2014-04-14: 10 mL
  Filled 2014-04-14: qty 10

## 2014-04-14 NOTE — Progress Notes (Signed)
Patient states he passed a couple of kidney stones yesterday, 1 stone on Dec. 1st, and 1 on Dec. 2nd. He states he has not had any unusual bleeding. No fever associated with this but had sone chills yesterday.  None today.

## 2014-04-14 NOTE — Patient Instructions (Signed)
PICC Home Guide A peripherally inserted central catheter (PICC) is a long, thin, flexible tube that is inserted into a vein in the upper arm. It is a form of intravenous (IV) access. It is considered to be a "central" line because the tip of the PICC ends in a large vein in your chest. This large vein is called the superior vena cava (SVC). The PICC tip ends in the SVC because there is a lot of blood flow in the SVC. This allows medicines and IV fluids to be quickly distributed throughout the body. The PICC is inserted using a sterile technique by a specially trained nurse or physician. After the PICC is inserted, a chest X-ray exam is done to be sure it is in the correct place.  A PICC may be placed for different reasons, such as:  To give medicines and liquid nutrition that can only be given through a central line. Examples are:  Certain antibiotic treatments.  Chemotherapy.  Total parenteral nutrition (TPN).  To take frequent blood samples.  To give IV fluids and blood products.  If there is difficulty placing a peripheral intravenous (PIV) catheter. If taken care of properly, a PICC can remain in place for several months. A PICC can also allow a person to go home from the hospital early. Medicine and PICC care can be managed at home by a family member or home health care team. WHAT PROBLEMS CAN HAPPEN WHEN I HAVE A PICC? Problems with a PICC can occasionally occur. These may include the following:  A blood clot (thrombus) forming in or at the tip of the PICC. This can cause the PICC to become clogged. A clot-dissolving medicine called tissue plasminogen activator (tPA) can be given through the PICC to help break up the clot.  Inflammation of the vein (phlebitis) in which the PICC is placed. Signs of inflammation may include redness, pain at the insertion site, red streaks, or being able to feel a "cord" in the vein where the PICC is located.  Infection in the PICC or at the insertion  site. Signs of infection may include fever, chills, redness, swelling, or pus drainage from the PICC insertion site.  PICC movement (malposition). The PICC tip may move from its original position due to excessive physical activity, forceful coughing, sneezing, or vomiting.  A break or cut in the PICC. It is important to not use scissors near the PICC.  Nerve or tendon irritation or injury during PICC insertion. WHAT SHOULD I KEEP IN MIND ABOUT ACTIVITIES WHEN I HAVE A PICC?  You may bend your arm and move it freely. If your PICC is near or at the bend of your elbow, avoid activity with repeated motion at the elbow.  Rest at home for the remainder of the day following PICC line insertion.  Avoid lifting heavy objects as instructed by your health care provider.  Avoid using a crutch with the arm on the same side as your PICC. You may need to use a walker. WHAT SHOULD I KNOW ABOUT MY PICC DRESSING?  Keep your PICC bandage (dressing) clean and dry to prevent infection.  Ask your health care provider when you may shower. Ask your health care provider to teach you how to wrap the PICC when you do take a shower.  Change the PICC dressing as instructed by your health care provider.  Change your PICC dressing if it becomes loose or wet. WHAT SHOULD I KNOW ABOUT PICC CARE?  Check the PICC insertion site   daily for leakage, redness, swelling, or pain.  Do not take a bath, swim, or use hot tubs when you have a PICC. Cover PICC line with clear plastic wrap and tape to keep it dry while showering.  Flush the PICC as directed by your health care provider. Let your health care provider know right away if the PICC is difficult to flush or does not flush. Do not use force to flush the PICC.  Do not use a syringe that is less than 10 mL to flush the PICC.  Never pull or tug on the PICC.  Avoid blood pressure checks on the arm with the PICC.  Keep your PICC identification card with you at all  times.  Do not take the PICC out yourself. Only a trained clinical professional should remove the PICC. SEEK IMMEDIATE MEDICAL CARE IF:  Your PICC is accidentally pulled all the way out. If this happens, cover the insertion site with a bandage or gauze dressing. Do not throw the PICC away. Your health care provider will need to inspect it.  Your PICC was tugged or pulled and has partially come out. Do not  push the PICC back in.  There is any type of drainage, redness, or swelling where the PICC enters the skin.  You cannot flush the PICC, it is difficult to flush, or the PICC leaks around the insertion site when it is flushed.  You hear a "flushing" sound when the PICC is flushed.  You have pain, discomfort, or numbness in your arm, shoulder, or jaw on the same side as the PICC.  You feel your heart "racing" or skipping beats.  You notice a hole or tear in the PICC.  You develop chills or a fever. MAKE SURE YOU:   Understand these instructions.  Will watch your condition.  Will get help right away if you are not doing well or get worse. Document Released: 10/29/2002 Document Revised: 09/08/2013 Document Reviewed: 12/30/2012 ExitCare Patient Information 2015 [employer]. This information is not intended to replace advice given to you by your health care provider. Make sure you discuss any questions you have with your health care provider.  

## 2014-04-14 NOTE — Patient Instructions (Addendum)
Blood Transfusion Information WHAT IS A BLOOD TRANSFUSION? A transfusion is the replacement of blood or some of its parts. Blood is made up of multiple cells which provide different functions.  Red blood cells carry oxygen and are used for blood loss replacement.  White blood cells fight against infection.  Platelets control bleeding.  Plasma helps clot blood.  Other blood products are available for specialized needs, such as hemophilia or other clotting disorders. BEFORE THE TRANSFUSION  Who gives blood for transfusions?   You may be able to donate blood to be used at a later date on yourself (autologous donation).  Relatives can be asked to donate blood. This is generally not any safer than if you have received blood from a stranger. The same precautions are taken to ensure safety when a relative's blood is donated.  Healthy volunteers who are fully evaluated to make sure their blood is safe. This is blood bank blood. Transfusion therapy is the safest it has ever been in the practice of medicine. Before blood is taken from a donor, a complete history is taken to make sure that person has no history of diseases nor engages in risky social behavior (examples are intravenous drug use or sexual activity with multiple partners). The donor's travel history is screened to minimize risk of transmitting infections, such as malaria. The donated blood is tested for signs of infectious diseases, such as HIV and hepatitis. The blood is then tested to be sure it is compatible with you in order to minimize the chance of a transfusion reaction. If you or a relative donates blood, this is often done in anticipation of surgery and is not appropriate for emergency situations. It takes many days to process the donated blood. RISKS AND COMPLICATIONS Although transfusion therapy is very safe and saves many lives, the main dangers of transfusion include:   Getting an infectious disease.  Developing a  transfusion reaction. This is an allergic reaction to something in the blood you were given. Every precaution is taken to prevent this. The decision to have a blood transfusion has been considered carefully by your caregiver before blood is given. Blood is not given unless the benefits outweigh the risks. AFTER THE TRANSFUSION  Right after receiving a blood transfusion, you will usually feel much better and more energetic. This is especially true if your red blood cells have gotten low (anemic). The transfusion raises the level of the red blood cells which carry oxygen, and this usually causes an energy increase.  The nurse administering the transfusion will monitor you carefully for complications. HOME CARE INSTRUCTIONS  No special instructions are needed after a transfusion. You may find your energy is better. Speak with your caregiver about any limitations on activity for underlying diseases you may have. SEEK MEDICAL CARE IF:   Your condition is not improving after your transfusion.  You develop redness or irritation at the intravenous (IV) site. SEEK IMMEDIATE MEDICAL CARE IF:  Any of the following symptoms occur over the next 12 hours:  Shaking chills.  You have a temperature by mouth above 102 F (38.9 C), not controlled by medicine.  Chest, back, or muscle pain.  People around you feel you are not acting correctly or are confused.  Shortness of breath or difficulty breathing.  Dizziness and fainting.  You get a rash or develop hives.  You have a decrease in urine output.  Your urine turns a dark color or changes to pink, red, or brown. Any of the following   symptoms occur over the next 10 days:  You have a temperature by mouth above 102 F (38.9 C), not controlled by medicine.  Shortness of breath.  Weakness after normal activity.  The white part of the eye turns yellow (jaundice).  You have a decrease in the amount of urine or are urinating less often.  Your  urine turns a dark color or changes to pink, red, or brown. Document Released: 04/21/2000 Document Revised: 07/17/2011 Document Reviewed: 12/09/2007 [facility] Patient Information 2015 [place], [place]. This information is not intended to replace advice given to you by your health care provider. Make sure you discuss any questions you have with your health care provider.  Thrombocytopenia Thrombocytopenia is a condition in which there is an abnormally small number of platelets in your blood. Platelets are also called thrombocytes. Platelets are needed for blood clotting. CAUSES Thrombocytopenia is caused by:   Decreased production of platelets. This can be caused by:  Aplastic anemia in which your bone marrow quits making blood cells.  Cancer in the bone marrow.  Use of certain medicines, including chemotherapy.  Infection in the bone marrow.  Heavy alcohol consumption.  Increased destruction of platelets. This can be caused by:  Certain immune diseases.  Use of certain drugs.  Certain blood clotting disorders.  Certain inherited disorders.  Certain bleeding disorders.  Pregnancy.  Having an enlarged spleen (hypersplenism). In hypersplenism, the spleen gathers up platelets from circulation. This means the platelets are not available to help with blood clotting. The spleen can enlarge due to cirrhosis or other conditions. SYMPTOMS  The symptoms of thrombocytopenia are side effects of poor blood clotting. Some of these are:  Abnormal bleeding.  Nosebleeds.  Heavy menstrual periods.  Blood in the urine or stools.  Purpura. This is a purplish discoloration in the skin produced by small bleeding vessels near the surface of the skin.  Bruising.  A rash that may be petechial. This looks like pinpoint, purplish-red spots on the skin and mucous membranes. It is caused by bleeding from small blood vessels (capillaries). DIAGNOSIS  Your caregiver will make this diagnosis based  on your exam and blood tests. Sometimes, a bone marrow study is done to look for the original cells (megakaryocytes) that make platelets. TREATMENT  Treatment depends on the cause of the condition.  Medicines may be given to help protect your platelets from being destroyed.  In some cases, a replacement (transfusion) of platelets may be required to stop or prevent bleeding.  Sometimes, the spleen must be surgically removed. HOME CARE INSTRUCTIONS   Check the skin and linings inside your mouth for bruising or bleeding as directed by your caregiver.  Check your sputum, urine, and stool for blood as directed by your caregiver.  Do not return to any activities that could cause bumps or bruises until your caregiver says it is okay.  Take extra care not to cut yourself when shaving or when using scissors, needles, knives, and other tools.  Take extra care not to burn yourself when ironing or cooking.  Ask your caregiver if it is okay for you to drink alcohol.  Only take over-the-counter or prescription medicines as directed by your caregiver.  Notify all your caregivers, including dentists and eye doctors, about your condition. SEEK IMMEDIATE MEDICAL CARE IF:   You develop active bleeding from anywhere in your body.  You develop unexplained bruising or bleeding.  You have blood in your sputum, urine, or stool. MAKE SURE YOU:  Understand these instructions.  Will watch your condition.  Will get help right away if you are not doing well or get worse. Document Released: 04/24/2005 Document Revised: 07/17/2011 Document Reviewed: 02/24/2011 [facility] Patient Information 2015 [place], [place]. This information is not intended to replace advice given to you by your health care provider. Make sure you discuss any questions you have with your health care provider.  Neutropenia Neutropenia is a condition that occurs when the level of a certain type of white blood cell (neutrophil) in your  body becomes lower than normal. Neutrophils are made in the bone marrow and fight infections. These cells protect against bacteria and viruses. The fewer neutrophils you have, and the longer your body remains without them, the greater your risk of getting a severe infection becomes. CAUSES  The cause of neutropenia may be hard to determine. However, it is usually due to 3 main problems:   Decreased production of neutrophils. This may be due to:  Certain medicines such as chemotherapy.  Genetic problems.  Cancer.  Radiation treatments.  Vitamin deficiency.  Some pesticides.  Increased destruction of neutrophils. This may be due to:  Overwhelming infections.  Hemolytic anemia. This is when the body destroys its own blood cells.  Chemotherapy.  Neutrophils moving to areas of the body where they cannot fight infections. This may be due to:  Dialysis procedures.  Conditions where the spleen becomes enlarged. Neutrophils are held in the spleen and are not available to the rest of the body.  Overwhelming infections. The neutrophils are held in the area of the infection and are not available to the rest of the body. SYMPTOMS  There are no specific symptoms of neutropenia. The lack of neutrophils can result in an infection, and an infection can cause various problems. DIAGNOSIS  Diagnosis is made by a blood test. A complete blood count is performed. The normal level of neutrophils in human blood differs with age and race. Infants have lower counts than older children and adults. African Americans have lower counts than Caucasians or Asians. The average adult level is 1500 cells/mm3 of blood. Neutrophil counts are interpreted as follows:  Greater than 1000 cells/mm3 gives normal protection against infection.  500 to 1000 cells/mm3 gives an increased risk for infection.  200 to 500 cells/mm3 is a greater risk for severe infection.  Lower than 200 cells/mm3 is a marked risk of  infection. This may require hospitalization and treatment with antibiotic medicines. TREATMENT  Treatment depends on the underlying cause, severity, and presence of infections or symptoms. It also depends on your health. Your caregiver will discuss the treatment plan with you. Mild cases are often easily treated and have a good outcome. Preventative measures may also be started to limit your risk of infections. Treatment can include:  Taking antibiotics.  Stopping medicines that are known to cause neutropenia.  Correcting nutritional deficiencies by eating green vegetables to supply folic acid and taking vitamin B supplements.  Stopping exposure to pesticides if your neutropenia is related to pesticide exposure.  Taking a blood growth factor called sargramostim, pegfilgrastim, or filgrastim if you are undergoing chemotherapy for cancer. This stimulates white blood cell production.  Removal of the spleen if you have Felty's syndrome and have repeated infections. HOME CARE INSTRUCTIONS   Follow your caregiver's instructions about when you need to have blood work done.  Wash your hands often. Make sure others who come in contact with you also wash their hands.  Wash raw fruits and vegetables before eating them. They can  carry bacteria and fungi.  Avoid people with colds or spreadable (contagious) diseases (chickenpox, herpes zoster, influenza).  Avoid large crowds.  Avoid construction areas. The dust can release fungus into the air.  Be cautious around children in daycare or school environments.  Take care of your respiratory system by coughing and deep breathing.  Bathe daily.  Protect your skin from cuts and burns.  Do not work in the garden or with flowers and plants.  Care for the mouth before and after meals by brushing with a soft toothbrush. If you have mucositis, do not use mouthwash. Mouthwash contains alcohol and can dry out the mouth even more.  Clean the area between  the genitals and the anus (perineal area) after urination and bowel movements. Women need to wipe from front to back.  Use a water soluble lubricant during sexual intercourse and practice good hygiene after. Do not have intercourse if you are severely neutropenic. Check with your caregiver for guidelines.  Exercise daily as tolerated.  Avoid people who were vaccinated with a live vaccine in the past 30 days. You should not receive live vaccines (polio, typhoid).  Do not provide direct care for pets. Avoid animal droppings. Do not clean litter boxes and bird cages.  Do not share food utensils.  Do not use tampons, enemas, or rectal suppositories unless directed by your caregiver.  Use an electric razor to remove hair.  Wash your hands after handling magazines, letters, and newspapers. SEEK IMMEDIATE MEDICAL CARE IF:   You have a fever.  You have chills or start to shake.  You feel nauseous or vomit.  You develop mouth sores.  You develop aches and pains.  You have redness and swelling around open wounds.  Your skin is warm to the touch.  You have pus coming from your wounds.  You develop swollen lymph nodes.  You feel weak or fatigued.  You develop red streaks on the skin. MAKE SURE YOU:  Understand these instructions.  Will watch your condition.  Will get help right away if you are not doing well or get worse. Document Released: 10/14/2001 Document Revised: 07/17/2011 Document Reviewed: 11/11/2010 [facility] Patient Information 2015 [place], [place]. This information is not intended to replace advice given to you by your health care provider. Make sure you discuss any questions you have with your health care provider.

## 2014-04-14 NOTE — Progress Notes (Signed)
Per MD and [facility] patient needs to have 2 units PRBC's and 1 unit of Platelets. Informed patient, to receive 1 unit PRBC's and platelets today and 2nd unit of PRBC's tomorrow (d/t infusion room availability). Patient knows to be her for lab appt to be [facility]. Orders placed.  [facility] informed.

## 2014-04-15 ENCOUNTER — Ambulatory Visit (HOSPITAL_BASED_OUTPATIENT_CLINIC_OR_DEPARTMENT_OTHER): Payer: Medicare Other

## 2014-04-15 VITALS — BP 177/93 | HR 72 | Temp 97.7°F | Resp 18

## 2014-04-15 DIAGNOSIS — D72819 Decreased white blood cell count, unspecified: Secondary | ICD-10-CM

## 2014-04-15 DIAGNOSIS — C9200 Acute myeloblastic leukemia, not having achieved remission: Secondary | ICD-10-CM

## 2014-04-15 LAB — PREPARE PLATELET PHERESIS: Unit division: 0

## 2014-04-15 MED ORDER — DIPHENHYDRAMINE HCL 25 MG PO CAPS
25.0000 mg | ORAL_CAPSULE | Freq: Once | ORAL | Status: AC
  Administered 2014-04-15: 25 mg via ORAL

## 2014-04-15 MED ORDER — ACETAMINOPHEN 325 MG PO TABS
650.0000 mg | ORAL_TABLET | Freq: Once | ORAL | Status: AC
  Administered 2014-04-15: 650 mg via ORAL

## 2014-04-15 MED ORDER — ACETAMINOPHEN 325 MG PO TABS
ORAL_TABLET | ORAL | Status: AC
  Filled 2014-04-15: qty 2

## 2014-04-15 MED ORDER — DIPHENHYDRAMINE HCL 25 MG PO CAPS
ORAL_CAPSULE | ORAL | Status: AC
  Filled 2014-04-15: qty 1

## 2014-04-15 NOTE — Patient Instructions (Signed)

## 2014-04-15 NOTE — Progress Notes (Signed)
1430: Pt reports he has passed 3 kidney stones yesterday and has been passing a lot of fluid overnight, denies any blood in urine.  States feels a little weak and tired today but otherwise okay.  Vital signs stable.

## 2014-04-16 ENCOUNTER — Other Ambulatory Visit (HOSPITAL_BASED_OUTPATIENT_CLINIC_OR_DEPARTMENT_OTHER): Payer: Medicare Other

## 2014-04-16 ENCOUNTER — Telehealth: Payer: Self-pay | Admitting: Medical Oncology

## 2014-04-16 ENCOUNTER — Ambulatory Visit (HOSPITAL_BASED_OUTPATIENT_CLINIC_OR_DEPARTMENT_OTHER): Payer: Medicare Other

## 2014-04-16 VITALS — BP 174/89 | HR 80 | Temp 98.5°F | Resp 16

## 2014-04-16 DIAGNOSIS — C9200 Acute myeloblastic leukemia, not having achieved remission: Secondary | ICD-10-CM

## 2014-04-16 DIAGNOSIS — Z452 Encounter for adjustment and management of vascular access device: Secondary | ICD-10-CM

## 2014-04-16 DIAGNOSIS — D72819 Decreased white blood cell count, unspecified: Secondary | ICD-10-CM

## 2014-04-16 LAB — CBC WITH DIFFERENTIAL/PLATELET
BASO%: 0 % (ref 0.0–2.0)
BASOS ABS: 0 10*3/uL (ref 0.0–0.1)
EOS%: 2.9 % (ref 0.0–7.0)
Eosinophils Absolute: 0 10*3/uL (ref 0.0–0.5)
HCT: 30.1 % — ABNORMAL LOW (ref 38.4–49.9)
HEMOGLOBIN: 9.8 g/dL — AB (ref 13.0–17.1)
LYMPH%: 75.4 % — ABNORMAL HIGH (ref 14.0–49.0)
MCH: 29.2 pg (ref 27.2–33.4)
MCHC: 32.6 g/dL (ref 32.0–36.0)
MCV: 89.6 fL (ref 79.3–98.0)
MONO#: 0 10*3/uL — ABNORMAL LOW (ref 0.1–0.9)
MONO%: 5.8 % (ref 0.0–14.0)
NEUT#: 0.1 10*3/uL — CL (ref 1.5–6.5)
NEUT%: 15.9 % — ABNORMAL LOW (ref 39.0–75.0)
Platelets: 58 10*3/uL — ABNORMAL LOW (ref 140–400)
RBC: 3.36 10*6/uL — AB (ref 4.20–5.82)
RDW: 20.1 % — AB (ref 11.0–14.6)
WBC: 0.7 10*3/uL — CL (ref 4.0–10.3)
lymph#: 0.5 10*3/uL — ABNORMAL LOW (ref 0.9–3.3)

## 2014-04-16 LAB — TYPE AND SCREEN
ABO/RH(D): O POS
Antibody Screen: NEGATIVE
UNIT DIVISION: 0
Unit division: 0

## 2014-04-16 LAB — COMPREHENSIVE METABOLIC PANEL (CC13)
ALBUMIN: 2.4 g/dL — AB (ref 3.5–5.0)
ALK PHOS: 67 U/L (ref 40–150)
ALT: 8 U/L (ref 0–55)
AST: 8 U/L (ref 5–34)
Anion Gap: 10 mEq/L (ref 3–11)
BUN: 13.8 mg/dL (ref 7.0–26.0)
CO2: 28 mEq/L (ref 22–29)
Calcium: 8.4 mg/dL (ref 8.4–10.4)
Chloride: 110 mEq/L — ABNORMAL HIGH (ref 98–109)
Creatinine: 0.9 mg/dL (ref 0.7–1.3)
EGFR: 81 mL/min/{1.73_m2} — ABNORMAL LOW (ref >90)
Glucose: 89 mg/dl (ref 70–140)
Potassium: 3.2 mEq/L — ABNORMAL LOW (ref 3.5–5.1)
Sodium: 148 mEq/L — ABNORMAL HIGH (ref 136–145)
Total Bilirubin: 0.68 mg/dL (ref 0.20–1.20)
Total Protein: 5.8 g/dL — ABNORMAL LOW (ref 6.4–8.3)

## 2014-04-16 LAB — MAGNESIUM (CC13): MAGNESIUM: 1.7 mg/dL (ref 1.5–2.5)

## 2014-04-16 MED ORDER — HEPARIN SOD (PORK) LOCK FLUSH 100 UNIT/ML IV SOLN
500.0000 [IU] | Freq: Once | INTRAVENOUS | Status: AC
Start: 2014-04-16 — End: 2014-04-16
  Administered 2014-04-16: 250 [IU] via INTRAVENOUS
  Filled 2014-04-16: qty 5

## 2014-04-16 MED ORDER — SODIUM CHLORIDE 0.9 % IJ SOLN
10.0000 mL | INTRAMUSCULAR | Status: DC | PRN
  Administered 2014-04-16: 10 mL via INTRAVENOUS
  Filled 2014-04-16: qty 10

## 2014-04-16 NOTE — Patient Instructions (Signed)
PICC Home Guide A peripherally inserted central catheter (PICC) is a long, thin, flexible tube that is inserted into a vein in the upper arm. It is a form of intravenous (IV) access. It is considered to be a "central" line because the tip of the PICC ends in a large vein in your chest. This large vein is called the superior vena cava (SVC). The PICC tip ends in the SVC because there is a lot of blood flow in the SVC. This allows medicines and IV fluids to be quickly distributed throughout the body. The PICC is inserted using a sterile technique by a specially trained nurse or physician. After the PICC is inserted, a chest X-ray exam is done to be sure it is in the correct place.  A PICC may be placed for different reasons, such as:  To give medicines and liquid nutrition that can only be given through a central line. Examples are:  Certain antibiotic treatments.  Chemotherapy.  Total parenteral nutrition (TPN).  To take frequent blood samples.  To give IV fluids and blood products.  If there is difficulty placing a peripheral intravenous (PIV) catheter. If taken care of properly, a PICC can remain in place for several months. A PICC can also allow a person to go home from the hospital early. Medicine and PICC care can be managed at home by a family member or home health care team. WHAT PROBLEMS CAN HAPPEN WHEN I HAVE A PICC? Problems with a PICC can occasionally occur. These may include the following:  A blood clot (thrombus) forming in or at the tip of the PICC. This can cause the PICC to become clogged. A clot-dissolving medicine called tissue plasminogen activator (tPA) can be given through the PICC to help break up the clot.  Inflammation of the vein (phlebitis) in which the PICC is placed. Signs of inflammation may include redness, pain at the insertion site, red streaks, or being able to feel a "cord" in the vein where the PICC is located.  Infection in the PICC or at the insertion  site. Signs of infection may include fever, chills, redness, swelling, or pus drainage from the PICC insertion site.  PICC movement (malposition). The PICC tip may move from its original position due to excessive physical activity, forceful coughing, sneezing, or vomiting.  A break or cut in the PICC. It is important to not use scissors near the PICC.  Nerve or tendon irritation or injury during PICC insertion. WHAT SHOULD I KEEP IN MIND ABOUT ACTIVITIES WHEN I HAVE A PICC?  You may bend your arm and move it freely. If your PICC is near or at the bend of your elbow, avoid activity with repeated motion at the elbow.  Rest at home for the remainder of the day following PICC line insertion.  Avoid lifting heavy objects as instructed by your health care provider.  Avoid using a crutch with the arm on the same side as your PICC. You may need to use a walker. WHAT SHOULD I KNOW ABOUT MY PICC DRESSING?  Keep your PICC bandage (dressing) clean and dry to prevent infection.  Ask your health care provider when you may shower. Ask your health care provider to teach you how to wrap the PICC when you do take a shower.  Change the PICC dressing as instructed by your health care provider.  Change your PICC dressing if it becomes loose or wet. WHAT SHOULD I KNOW ABOUT PICC CARE?  Check the PICC insertion site   daily for leakage, redness, swelling, or pain.  Do not take a bath, swim, or use hot tubs when you have a PICC. Cover PICC line with clear plastic wrap and tape to keep it dry while showering.  Flush the PICC as directed by your health care provider. Let your health care provider know right away if the PICC is difficult to flush or does not flush. Do not use force to flush the PICC.  Do not use a syringe that is less than 10 mL to flush the PICC.  Never pull or tug on the PICC.  Avoid blood pressure checks on the arm with the PICC.  Keep your PICC identification card with you at all  times.  Do not take the PICC out yourself. Only a trained clinical professional should remove the PICC. SEEK IMMEDIATE MEDICAL CARE IF:  Your PICC is accidentally pulled all the way out. If this happens, cover the insertion site with a bandage or gauze dressing. Do not throw the PICC away. Your health care provider will need to inspect it.  Your PICC was tugged or pulled and has partially come out. Do not  push the PICC back in.  There is any type of drainage, redness, or swelling where the PICC enters the skin.  You cannot flush the PICC, it is difficult to flush, or the PICC leaks around the insertion site when it is flushed.  You hear a "flushing" sound when the PICC is flushed.  You have pain, discomfort, or numbness in your arm, shoulder, or jaw on the same side as the PICC.  You feel your heart "racing" or skipping beats.  You notice a hole or tear in the PICC.  You develop chills or a fever. MAKE SURE YOU:   Understand these instructions.  Will watch your condition.  Will get help right away if you are not doing well or get worse. Document Released: 10/29/2002 Document Revised: 09/08/2013 Document Reviewed: 12/30/2012 ExitCare Patient Information 2015 [employer]. This information is not intended to replace advice given to you by your health care provider. Make sure you discuss any questions you have with your health care provider.  

## 2014-04-16 NOTE — Telephone Encounter (Signed)
Per [facility] request, faxed patient's lab results from today, to [phone].

## 2014-04-20 ENCOUNTER — Other Ambulatory Visit: Payer: Medicare Other

## 2014-04-22 ENCOUNTER — Telehealth: Payer: Self-pay | Admitting: Oncology

## 2014-04-22 ENCOUNTER — Other Ambulatory Visit: Payer: Self-pay | Admitting: Medical Oncology

## 2014-04-22 DIAGNOSIS — D72819 Decreased white blood cell count, unspecified: Secondary | ICD-10-CM

## 2014-04-22 NOTE — Progress Notes (Unsigned)
Fax received for Lab orders from [facility], Hematology and Oncology, Dr. Florene Glen. Patient to have CBC, CMP and Mg 12/23 and then every Monday and CBC every Thursday, as well,  through to May 14, 2014.  Patient to have lab appts for 12/23, 12/28, 12/31,05/11/2014, 05/14/2014  POF sent. Schedulers to notify patient of appt times.

## 2014-04-22 NOTE — Telephone Encounter (Signed)
s.w. pt wife and advised on added appts...Marland Kitchenhe will get sched at [facility] visit

## 2014-04-23 ENCOUNTER — Other Ambulatory Visit: Payer: Medicare Other

## 2014-04-24 ENCOUNTER — Telehealth: Payer: Self-pay | Admitting: Oncology

## 2014-04-24 ENCOUNTER — Telehealth: Payer: Self-pay | Admitting: Internal Medicine

## 2014-04-24 NOTE — Telephone Encounter (Signed)
Error

## 2014-04-24 NOTE — Telephone Encounter (Signed)
Pt called and wanted a cb to confirm appt have been cx for 12/21 & 12/24 due to being at [facility].

## 2014-04-27 ENCOUNTER — Other Ambulatory Visit: Payer: Medicare Other

## 2014-04-29 ENCOUNTER — Ambulatory Visit (HOSPITAL_BASED_OUTPATIENT_CLINIC_OR_DEPARTMENT_OTHER): Payer: Medicare Other

## 2014-04-29 ENCOUNTER — Ambulatory Visit (HOSPITAL_BASED_OUTPATIENT_CLINIC_OR_DEPARTMENT_OTHER): Payer: Medicare Other | Admitting: Lab

## 2014-04-29 ENCOUNTER — Telehealth: Payer: Self-pay

## 2014-04-29 DIAGNOSIS — Z452 Encounter for adjustment and management of vascular access device: Secondary | ICD-10-CM

## 2014-04-29 DIAGNOSIS — D72819 Decreased white blood cell count, unspecified: Secondary | ICD-10-CM

## 2014-04-29 DIAGNOSIS — C9200 Acute myeloblastic leukemia, not having achieved remission: Secondary | ICD-10-CM

## 2014-04-29 LAB — COMPREHENSIVE METABOLIC PANEL (CC13)
ALBUMIN: 2.6 g/dL — AB (ref 3.5–5.0)
ALT: 7 U/L (ref 0–55)
AST: 10 U/L (ref 5–34)
Alkaline Phosphatase: 72 U/L (ref 40–150)
Anion Gap: 9 mEq/L (ref 3–11)
BUN: 13 mg/dL (ref 7.0–26.0)
CALCIUM: 8 mg/dL — AB (ref 8.4–10.4)
CHLORIDE: 107 meq/L (ref 98–109)
CO2: 27 meq/L (ref 22–29)
CREATININE: 1 mg/dL (ref 0.7–1.3)
EGFR: 71 mL/min/{1.73_m2} — ABNORMAL LOW (ref >90)
Glucose: 106 mg/dl (ref 70–140)
Potassium: 3.4 mEq/L — ABNORMAL LOW (ref 3.5–5.1)
Sodium: 143 mEq/L (ref 136–145)
Total Bilirubin: 0.6 mg/dL (ref 0.20–1.20)
Total Protein: 5.6 g/dL — ABNORMAL LOW (ref 6.4–8.3)

## 2014-04-29 LAB — CBC WITH DIFFERENTIAL/PLATELET
BASO%: 0 % (ref 0.0–2.0)
BASOS ABS: 0 10*3/uL (ref 0.0–0.1)
EOS%: 0.3 % (ref 0.0–7.0)
Eosinophils Absolute: 0 10*3/uL (ref 0.0–0.5)
HEMATOCRIT: 30.6 % — AB (ref 38.4–49.9)
HEMOGLOBIN: 9.9 g/dL — AB (ref 13.0–17.1)
LYMPH#: 0.8 10*3/uL — AB (ref 0.9–3.3)
LYMPH%: 26.3 % (ref 14.0–49.0)
MCH: 30 pg (ref 27.2–33.4)
MCHC: 32.4 g/dL (ref 32.0–36.0)
MCV: 92.7 fL (ref 79.3–98.0)
MONO#: 0.1 10*3/uL (ref 0.1–0.9)
MONO%: 4 % (ref 0.0–14.0)
NEUT%: 69.4 % (ref 39.0–75.0)
NEUTROS ABS: 2.1 10*3/uL (ref 1.5–6.5)
Platelets: 84 10*3/uL — ABNORMAL LOW (ref 140–400)
RBC: 3.3 10*6/uL — ABNORMAL LOW (ref 4.20–5.82)
RDW: 20.9 % — ABNORMAL HIGH (ref 11.0–14.6)
WBC: 3 10*3/uL — ABNORMAL LOW (ref 4.0–10.3)

## 2014-04-29 LAB — MAGNESIUM (CC13): MAGNESIUM: 1.9 mg/dL (ref 1.5–2.5)

## 2014-04-29 LAB — HOLD TUBE, BLOOD BANK

## 2014-04-29 MED ORDER — SODIUM CHLORIDE 0.9 % IJ SOLN
10.0000 mL | INTRAMUSCULAR | Status: DC | PRN
  Administered 2014-04-29: 10 mL via INTRAVENOUS
  Filled 2014-04-29: qty 10

## 2014-04-29 MED ORDER — HEPARIN SOD (PORK) LOCK FLUSH 100 UNIT/ML IV SOLN
500.0000 [IU] | Freq: Once | INTRAVENOUS | Status: AC
  Administered 2014-04-29: 250 [IU] via INTRAVENOUS
  Filled 2014-04-29: qty 5

## 2014-04-29 NOTE — Patient Instructions (Signed)
PICC Home Guide A peripherally inserted central catheter (PICC) is a long, thin, flexible tube that is inserted into a vein in the upper arm. It is a form of intravenous (IV) access. It is considered to be a "central" line because the tip of the PICC ends in a large vein in your chest. This large vein is called the superior vena cava (SVC). The PICC tip ends in the SVC because there is a lot of blood flow in the SVC. This allows medicines and IV fluids to be quickly distributed throughout the body. The PICC is inserted using a sterile technique by a specially trained nurse or physician. After the PICC is inserted, a chest X-ray exam is done to be sure it is in the correct place.  A PICC may be placed for different reasons, such as:  To give medicines and liquid nutrition that can only be given through a central line. Examples are:  Certain antibiotic treatments.  Chemotherapy.  Total parenteral nutrition (TPN).  To take frequent blood samples.  To give IV fluids and blood products.  If there is difficulty placing a peripheral intravenous (PIV) catheter. If taken care of properly, a PICC can remain in place for several months. A PICC can also allow a person to go home from the hospital early. Medicine and PICC care can be managed at home by a family member or home health care team. WHAT PROBLEMS CAN HAPPEN WHEN I HAVE A PICC? Problems with a PICC can occasionally occur. These may include the following:  A blood clot (thrombus) forming in or at the tip of the PICC. This can cause the PICC to become clogged. A clot-dissolving medicine called tissue plasminogen activator (tPA) can be given through the PICC to help break up the clot.  Inflammation of the vein (phlebitis) in which the PICC is placed. Signs of inflammation may include redness, pain at the insertion site, red streaks, or being able to feel a "cord" in the vein where the PICC is located.  Infection in the PICC or at the insertion  site. Signs of infection may include fever, chills, redness, swelling, or pus drainage from the PICC insertion site.  PICC movement (malposition). The PICC tip may move from its original position due to excessive physical activity, forceful coughing, sneezing, or vomiting.  A break or cut in the PICC. It is important to not use scissors near the PICC.  Nerve or tendon irritation or injury during PICC insertion. WHAT SHOULD I KEEP IN MIND ABOUT ACTIVITIES WHEN I HAVE A PICC?  You may bend your arm and move it freely. If your PICC is near or at the bend of your elbow, avoid activity with repeated motion at the elbow.  Rest at home for the remainder of the day following PICC line insertion.  Avoid lifting heavy objects as instructed by your health care provider.  Avoid using a crutch with the arm on the same side as your PICC. You may need to use a walker. WHAT SHOULD I KNOW ABOUT MY PICC DRESSING?  Keep your PICC bandage (dressing) clean and dry to prevent infection.  Ask your health care provider when you may shower. Ask your health care provider to teach you how to wrap the PICC when you do take a shower.  Change the PICC dressing as instructed by your health care provider.  Change your PICC dressing if it becomes loose or wet. WHAT SHOULD I KNOW ABOUT PICC CARE?  Check the PICC insertion site   daily for leakage, redness, swelling, or pain.  Do not take a bath, swim, or use hot tubs when you have a PICC. Cover PICC line with clear plastic wrap and tape to keep it dry while showering.  Flush the PICC as directed by your health care provider. Let your health care provider know right away if the PICC is difficult to flush or does not flush. Do not use force to flush the PICC.  Do not use a syringe that is less than 10 mL to flush the PICC.  Never pull or tug on the PICC.  Avoid blood pressure checks on the arm with the PICC.  Keep your PICC identification card with you at all  times.  Do not take the PICC out yourself. Only a trained clinical professional should remove the PICC. SEEK IMMEDIATE MEDICAL CARE IF:  Your PICC is accidentally pulled all the way out. If this happens, cover the insertion site with a bandage or gauze dressing. Do not throw the PICC away. Your health care provider will need to inspect it.  Your PICC was tugged or pulled and has partially come out. Do not  push the PICC back in.  There is any type of drainage, redness, or swelling where the PICC enters the skin.  You cannot flush the PICC, it is difficult to flush, or the PICC leaks around the insertion site when it is flushed.  You hear a "flushing" sound when the PICC is flushed.  You have pain, discomfort, or numbness in your arm, shoulder, or jaw on the same side as the PICC.  You feel your heart "racing" or skipping beats.  You notice a hole or tear in the PICC.  You develop chills or a fever. MAKE SURE YOU:   Understand these instructions.  Will watch your condition.  Will get help right away if you are not doing well or get worse. Document Released: 10/29/2002 Document Revised: 09/08/2013 Document Reviewed: 12/30/2012 ExitCare Patient Information 2015 [employer]. This information is not intended to replace advice given to you by your health care provider. Make sure you discuss any questions you have with your health care provider.  

## 2014-04-29 NOTE — Telephone Encounter (Signed)
Faxed lab results from today to [facility] per request: [phone].

## 2014-04-30 ENCOUNTER — Other Ambulatory Visit: Payer: Medicare Other

## 2014-05-04 ENCOUNTER — Encounter (HOSPITAL_COMMUNITY): Payer: Self-pay | Admitting: Adult Health

## 2014-05-04 ENCOUNTER — Emergency Department (HOSPITAL_COMMUNITY): Payer: Medicare Other

## 2014-05-04 ENCOUNTER — Inpatient Hospital Stay (HOSPITAL_COMMUNITY)
Admission: EM | Admit: 2014-05-04 | Discharge: 2014-05-07 | DRG: 291 | Disposition: A | Discharge status: Home Health | Payer: Medicare Other | Attending: Internal Medicine | Admitting: Internal Medicine

## 2014-05-04 ENCOUNTER — Other Ambulatory Visit: Payer: Medicare Other

## 2014-05-04 DIAGNOSIS — K5090 Crohn's disease, unspecified, without complications: Secondary | ICD-10-CM | POA: Diagnosis present

## 2014-05-04 DIAGNOSIS — I5041 Acute combined systolic (congestive) and diastolic (congestive) heart failure: Principal | ICD-10-CM | POA: Diagnosis present

## 2014-05-04 DIAGNOSIS — D61810 Antineoplastic chemotherapy induced pancytopenia: Secondary | ICD-10-CM | POA: Diagnosis present

## 2014-05-04 DIAGNOSIS — I509 Heart failure, unspecified: Secondary | ICD-10-CM | POA: Diagnosis present

## 2014-05-04 DIAGNOSIS — E876 Hypokalemia: Secondary | ICD-10-CM | POA: Diagnosis present

## 2014-05-04 DIAGNOSIS — I10 Essential (primary) hypertension: Secondary | ICD-10-CM | POA: Diagnosis present

## 2014-05-04 DIAGNOSIS — N400 Enlarged prostate without lower urinary tract symptoms: Secondary | ICD-10-CM | POA: Diagnosis present

## 2014-05-04 DIAGNOSIS — Z800 Family history of malignant neoplasm of digestive organs: Secondary | ICD-10-CM

## 2014-05-04 DIAGNOSIS — D709 Neutropenia, unspecified: Secondary | ICD-10-CM | POA: Diagnosis present

## 2014-05-04 DIAGNOSIS — I951 Orthostatic hypotension: Secondary | ICD-10-CM | POA: Diagnosis present

## 2014-05-04 DIAGNOSIS — I493 Ventricular premature depolarization: Secondary | ICD-10-CM | POA: Insufficient documentation

## 2014-05-04 DIAGNOSIS — I059 Rheumatic mitral valve disease, unspecified: Secondary | ICD-10-CM

## 2014-05-04 DIAGNOSIS — Z66 Do not resuscitate: Secondary | ICD-10-CM | POA: Diagnosis present

## 2014-05-04 DIAGNOSIS — Z8249 Family history of ischemic heart disease and other diseases of the circulatory system: Secondary | ICD-10-CM

## 2014-05-04 DIAGNOSIS — J9600 Acute respiratory failure, unspecified whether with hypoxia or hypercapnia: Secondary | ICD-10-CM | POA: Diagnosis not present

## 2014-05-04 DIAGNOSIS — Z823 Family history of stroke: Secondary | ICD-10-CM | POA: Diagnosis not present

## 2014-05-04 DIAGNOSIS — Z888 Allergy status to other drugs, medicaments and biological substances status: Secondary | ICD-10-CM

## 2014-05-04 DIAGNOSIS — T451X5A Adverse effect of antineoplastic and immunosuppressive drugs, initial encounter: Secondary | ICD-10-CM | POA: Diagnosis present

## 2014-05-04 DIAGNOSIS — I429 Cardiomyopathy, unspecified: Secondary | ICD-10-CM | POA: Diagnosis present

## 2014-05-04 DIAGNOSIS — I4891 Unspecified atrial fibrillation: Secondary | ICD-10-CM | POA: Diagnosis present

## 2014-05-04 DIAGNOSIS — D61818 Other pancytopenia: Secondary | ICD-10-CM | POA: Diagnosis present

## 2014-05-04 DIAGNOSIS — C9200 Acute myeloblastic leukemia, not having achieved remission: Secondary | ICD-10-CM | POA: Diagnosis present

## 2014-05-04 DIAGNOSIS — D696 Thrombocytopenia, unspecified: Secondary | ICD-10-CM | POA: Diagnosis present

## 2014-05-04 DIAGNOSIS — I5031 Acute diastolic (congestive) heart failure: Secondary | ICD-10-CM | POA: Diagnosis present

## 2014-05-04 HISTORY — DX: Acute myeloblastic leukemia, not having achieved remission: C92.00

## 2014-05-04 HISTORY — DX: Reserved for inherently not codable concepts without codable children: IMO0001

## 2014-05-04 LAB — BASIC METABOLIC PANEL
Anion gap: 8 (ref 5–15)
BUN: 13 mg/dL (ref 6–23)
CHLORIDE: 106 meq/L (ref 96–112)
CO2: 29 mmol/L (ref 19–32)
CREATININE: 1.09 mg/dL (ref 0.50–1.35)
Calcium: 8.5 mg/dL (ref 8.4–10.5)
GFR calc Af Amer: 75 mL/min — ABNORMAL LOW (ref >90)
GFR calc non Af Amer: 65 mL/min — ABNORMAL LOW (ref >90)
Glucose, Bld: 117 mg/dL — ABNORMAL HIGH (ref 70–99)
Potassium: 3.5 mmol/L (ref 3.5–5.1)
Sodium: 143 mmol/L (ref 135–145)

## 2014-05-04 LAB — CBC
HCT: 30.8 % — ABNORMAL LOW (ref 39.0–52.0)
Hemoglobin: 9.8 g/dL — ABNORMAL LOW (ref 13.0–17.0)
MCH: 29.3 pg (ref 26.0–34.0)
MCHC: 31.8 g/dL (ref 30.0–36.0)
MCV: 92.2 fL (ref 78.0–100.0)
Platelets: 32 10*3/uL — ABNORMAL LOW (ref 150–400)
RBC: 3.34 MIL/uL — AB (ref 4.22–5.81)
RDW: 21.2 % — AB (ref 11.5–15.5)
WBC: 2.3 10*3/uL — ABNORMAL LOW (ref 4.0–10.5)

## 2014-05-04 LAB — I-STAT TROPONIN, ED: Troponin i, poc: 0.05 ng/mL (ref 0.00–0.08)

## 2014-05-04 LAB — TSH: TSH: 1.404 u[IU]/mL (ref 0.350–4.500)

## 2014-05-04 LAB — BRAIN NATRIURETIC PEPTIDE
B NATRIURETIC PEPTIDE 5: 2124.1 pg/mL — AB (ref 0.0–100.0)
B Natriuretic Peptide: 3304.8 pg/mL — ABNORMAL HIGH (ref 0.0–100.0)

## 2014-05-04 MED ORDER — SENNOSIDES-DOCUSATE SODIUM 8.6-50 MG PO TABS: 1.0000 | ORAL_TABLET | Freq: Every day | ORAL | Status: DC | PRN

## 2014-05-04 MED ORDER — LEVOFLOXACIN 500 MG PO TABS
500.0000 mg | ORAL_TABLET | Freq: Once | ORAL | Status: AC
  Administered 2014-05-04: 500 mg via ORAL
  Filled 2014-05-04: qty 1

## 2014-05-04 MED ORDER — SODIUM CHLORIDE 0.9 % IJ SOLN
3.0000 mL | Freq: Two times a day (BID) | INTRAMUSCULAR | Status: DC
  Administered 2014-05-04 – 2014-05-06 (×3): 3 mL via INTRAVENOUS

## 2014-05-04 MED ORDER — PANTOPRAZOLE SODIUM 40 MG PO TBEC
40.0000 mg | DELAYED_RELEASE_TABLET | Freq: Every day | ORAL | Status: DC
  Administered 2014-05-04 – 2014-05-07 (×4): 40 mg via ORAL
  Filled 2014-05-04 (×4): qty 1

## 2014-05-04 MED ORDER — ONDANSETRON HCL 4 MG/2ML IJ SOLN: 4.0000 mg | Freq: Four times a day (QID) | INTRAMUSCULAR | Status: DC | PRN

## 2014-05-04 MED ORDER — MIRTAZAPINE 7.5 MG PO TABS
15.0000 mg | ORAL_TABLET | Freq: Every day | ORAL | Status: DC
  Administered 2014-05-04 – 2014-05-06 (×3): 15 mg via ORAL
  Filled 2014-05-04: qty 1
  Filled 2014-05-04 (×3): qty 2

## 2014-05-04 MED ORDER — SODIUM CHLORIDE 0.9 % IJ SOLN
10.0000 mL | INTRAMUSCULAR | Status: DC | PRN
  Administered 2014-05-05: 12 mL
  Filled 2014-05-04: qty 40

## 2014-05-04 MED ORDER — SODIUM CHLORIDE 0.9 % IV SOLN: 250.0000 mL | INTRAVENOUS | Status: DC | PRN

## 2014-05-04 MED ORDER — POTASSIUM CHLORIDE CRYS ER 20 MEQ PO TBCR
20.0000 meq | EXTENDED_RELEASE_TABLET | Freq: Two times a day (BID) | ORAL | Status: DC
  Administered 2014-05-04 – 2014-05-05 (×3): 20 meq via ORAL
  Filled 2014-05-04 (×3): qty 1

## 2014-05-04 MED ORDER — FUROSEMIDE 10 MG/ML IJ SOLN
40.0000 mg | Freq: Two times a day (BID) | INTRAMUSCULAR | Status: AC
  Administered 2014-05-04 – 2014-05-05 (×4): 40 mg via INTRAVENOUS
  Filled 2014-05-04 (×4): qty 4

## 2014-05-04 MED ORDER — SODIUM CHLORIDE 0.9 % IJ SOLN: 3.0000 mL | INTRAMUSCULAR | Status: DC | PRN

## 2014-05-04 MED ORDER — ACETAMINOPHEN 325 MG PO TABS: 650.0000 mg | ORAL_TABLET | ORAL | Status: DC | PRN

## 2014-05-04 MED ORDER — POLYETHYLENE GLYCOL 3350 17 G PO PACK: 17.0000 g | PACK | Freq: Every day | ORAL | Status: DC | PRN

## 2014-05-04 MED ORDER — FLUCONAZOLE 200 MG PO TABS
200.0000 mg | ORAL_TABLET | Freq: Every day | ORAL | Status: DC
  Administered 2014-05-04 – 2014-05-06 (×3): 200 mg via ORAL
  Filled 2014-05-04 (×4): qty 1

## 2014-05-04 MED ORDER — FUROSEMIDE 10 MG/ML IJ SOLN
40.0000 mg | Freq: Once | INTRAMUSCULAR | Status: AC
  Administered 2014-05-04: 40 mg via INTRAVENOUS
  Filled 2014-05-04: qty 4

## 2014-05-04 MED ORDER — LORATADINE 10 MG PO TABS
10.0000 mg | ORAL_TABLET | Freq: Every day | ORAL | Status: DC | PRN
  Filled 2014-05-04 (×2): qty 1

## 2014-05-04 MED ORDER — FINASTERIDE 5 MG PO TABS
5.0000 mg | ORAL_TABLET | Freq: Every day | ORAL | Status: DC
  Administered 2014-05-04 – 2014-05-06 (×3): 5 mg via ORAL
  Filled 2014-05-04 (×4): qty 1

## 2014-05-04 MED ORDER — DILTIAZEM HCL 30 MG PO TABS
30.0000 mg | ORAL_TABLET | Freq: Four times a day (QID) | ORAL | Status: DC
  Administered 2014-05-04: 30 mg via ORAL
  Filled 2014-05-04 (×5): qty 1

## 2014-05-04 NOTE — Progress Notes (Signed)
  Echocardiogram 2D Echocardiogram has been performed.  [name] FRANCES 05/04/2014, 4:57 PM

## 2014-05-04 NOTE — ED Notes (Signed)
Admitting MD at bedside.

## 2014-05-04 NOTE — Progress Notes (Signed)
Patient admitted a few hours ago for new onset CHF. Type unclear. 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  He is much improved, he feels better, vital signs stable he has  responded well to IV Lasix, echogram ordered, and Cards input requested. Monitor closely on Lasix, follow echo report. Likely discharge in 24-48 hours.

## 2014-05-04 NOTE — Consult Note (Signed)
Name: [name] is a 73 y.o. male Admit date: 05/04/2014  Referring Physician:  Jana Hakim, M.D. Primary Physician:  Gaynelle Arabian, M.D. Primary Cardiologist:  HW[facility] Blenda Bridegroom, M.D.  Reason for Consultation:  CHF  ASSESSMENT: 1. Suspect acute systolic heart failure possibly related to decitabine which is being used for the patient's myelodysplastic syndrome/acute myelocytic leukemia 2. Crohn's disease 3. Pancytopenia likely related to chemotherapy 4. Pre-existing severe anemia 5. I am unable to confirm atrial fibrillation based on EKGs present in chart. There is 1 electrocardiogram that diagnosis atrial fibrillation but I believe this is an error.  PLAN:  1. 2-D Doppler echocardiogram to assess LV size and function 2. Diuresis with intravenous loop diuretic 3. Cycle cardiac markers to rule out MI 4. Will likely need beta blocker and ACE inhibitor therapy. Chemotherapy agent, decitabine may need to be considered as the offending agent and therefore switched to some other appropriate therapy if systolic heart failure is confirmed. 5. White blood cell count with differential to rule out absolute neutropenia   HPI: 73 year old gentleman with multiple medical problems that include Crohn's disease, recurrent kidney stones, and acute myeloblastic leukemia. He has been on decitabine therapy since May. His therapy is via Dr. Florene Glen at [facility]. Over the past month the patient has noted gradual increase in shortness of breath, lower extremity swelling, and abdominal distention. He came to the emergency room last evening when dyspnea became unbearable. He has been unable to lie down. He is also had intermittent tightness in his chest. There is no prior history of heart disease. There is a history of hypertension. There was some concern for atrial fibrillation on admission. I am unable to find EKGs echo confirmed this rhythm disturbance.  PMH:   Past  Medical History  Diagnosis Date  . Crohn's disease   . Iron deficiency anemia   . Leukopenia   . Hypertension   . BPH (benign prostatic hyperplasia)   . 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 Kidney stones   . AML (acute myeloblastic leukemia)     PSH:   Past Surgical History  Procedure Laterality Date  . Lumbar fusion      L5-S1  . Kidney stone surgery    . Cataract extraction Right   . Cardiac catheterization  2001   Allergies:  Maxzide; Metoprolol; Pentasa; and Quinapril Prior to Admit Meds:   (Not in a hospital admission) Fam HX:    Family History  Problem Relation Age of Onset  . Colon cancer Mother   . Emphysema Father   . CAD Father   . Stroke Father   . Crohn's disease Neg Hx   . Ulcerative colitis Neg Hx   . Leukemia Neg Hx   . Ovarian cancer Paternal Aunt    Social HX:    History   Social History  . Marital Status: Married    Spouse Name: N/A    Number of Children: N/A  . Years of Education: N/A   Occupational History  . Not on file.   Social History Main Topics  . Smoking status: Never Smoker   . Smokeless tobacco: Not on file  . Alcohol Use: No  . Drug Use: No  . Sexual Activity: Not on file   Other Topics Concern  . Not on file   Social History Narrative   Lives with his wife.  Has recently been using a rolling walker.  Wife  has ovarian cancer and has been receiving chemotherapy.  Was in remission and restarted chemo in February because of recurrence.       Review of Systems: Appetite is been poor. States that his AML has improved since starting chemotherapy. Has had chronic anemia. There is been chronic dyspnea on exertion but over the past month this is gotten significantly worse. No prior history of heart disease. Denies claudication. He denies palpitations. No history of syncope, stroke, or lung disease.  Physical Exam: Blood pressure 145/89, pulse 81, temperature 98.7 F (37.1 C), temperature source Oral,  resp. rate 23, SpO2 100 %. Weight change:   Pale but in good spirits. No acute distress is noted. HEENT exam reveals pale conjunctiva Neck exam reveals moderate jugular vein distention. No carotid bruits are heard. Chest reveals decreased breath sounds at both bases. Cardiac exam reveals an irregular rhythm. Soft systolic murmur is heard at the apex. A soft gallop is heard although this is difficult to confirm because of irregularity. No pericardial rub is audible. Abdomen is soft without tenderness. Liver edge is not palpable. Extremities reveal 1-2+ edema bilaterally. There is mild erythema in both lower extremities. Neurological exam reveals no motor or sensory deficit. Labs: Lab Results  Component Value Date   WBC 2.3* 05/04/2014   HGB 9.8* 05/04/2014   HCT 30.8* 05/04/2014   MCV 92.2 05/04/2014   PLT 32* 05/04/2014    Recent Labs Lab 04/29/14 0906 05/04/14 0202  NA 143 143  K 3.4* 3.5  CL  --  106  CO2 27 29  BUN 13.0 13  CREATININE 1.0 1.09  CALCIUM 8.0* 8.5  PROT 5.6*  --   BILITOT 0.60  --   ALKPHOS 72  --   ALT 7  --   AST 10  --   GLUCOSE 106 117*   No results found for: PTT Lab Results  Component Value Date   INR 1.38 10/01/2013   BNP No results found for: PROBNP   Radiology:  Dg Chest Port 1 View  05/04/2014   CLINICAL DATA:  Tachycardia  EXAM: PORTABLE CHEST - 1 VIEW  COMPARISON:  09/30/2013  FINDINGS: New cardiomegaly which is accentuated by low lung volumes. There is widening of the vascular pedicle.  There is a left upper extremity PICC, with tip overlapping the descending thoracic aorta. 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  There is diffuse interstitial opacity and small bilateral pleural effusions. No pneumothorax.  These results were called by telephone at the time of interpretation on 05/04/2014 at 3:14 am to Dr. [name] , who verbally acknowledged these results.  IMPRESSION: 1. New cardiomegaly or pericardial effusion  with CHF pattern. Noted history of AML, note that ATRA syndrome can have this appearance. 2. Atypical positioning of left upper extremity PICC. If clinically venous, the tip is likely within a persistent left SVC (present on 04/18/2013 abdominal CT). If arterial, the tip is in the descending thoracic aorta.   Electronically Signed   By: Jorje Guild M.D.   On: 05/04/2014 03:13    EKG:  Ectopic atrial rhythm with frequent PACs and grouped beating. QS pattern V1 through V3.    Sinclair Grooms 05/04/2014 10:07 AM

## 2014-05-04 NOTE — ED Notes (Signed)
Presents with onset of SOB began at 00:22 woke him from sleep, felt like a load was on his chest, EMS found pt to be in RVR-gave 10mg  of cardizem IV and O2.  sats on RA 94%.  No cardiac history.

## 2014-05-04 NOTE — ED Provider Notes (Signed)
CSN: [id number]     Arrival date & time 05/04/14  0138 History  This chart was scribed for Eric Reichert, MD by Marlowe Kays, ED Scribe. This patient was seen in room [facility] and the patient's care was started at 1:52 AM.  Chief Complaint  Patient presents with  . Tachycardia   The history is provided by the patient. No language interpreter was used.    HPI Comments:  [name] is a 73 y.o. male brought in by EMS, who presents to the Emergency Department complaining of SOB that woke him from sleep about 1.5 hours ago. Pt states he took an anti-anxiety medication prior to going to sleep last night. He reports feeling severe heaviness to his chest. EMS reports giving him Cardizem en route to the hospital. He reports associated generalized weakness and sharp pain in his head. Pt reports he used to be on anticoagulant therapy but is no longer on it. He states he has not seen a cardiologist in over 20 years but has had a heart catherization prior to that. Denies any CP. PMH of Crohn's disease, anemia, leukopenia, HTN, BPH, peripheral neuropathy and kidney stones.   Past Medical History  Diagnosis Date  . Crohn's disease   . Iron deficiency anemia   . Leukopenia   . Hypertension   . BPH (benign prostatic hyperplasia)   . 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 Kidney stones   . AML (acute myeloblastic leukemia)    Past Surgical History  Procedure Laterality Date  . Lumbar fusion      L5-S1  . Kidney stone surgery    . Cataract extraction Right   . Cardiac catheterization  2001   Family History  Problem Relation Age of Onset  . Colon cancer Mother   . Emphysema Father   . CAD Father   . Stroke Father   . Crohn's disease Neg Hx   . Ulcerative colitis Neg Hx   . Leukemia Neg Hx   . Ovarian cancer Paternal Aunt    History  Substance Use Topics  . Smoking status: Never Smoker   . Smokeless tobacco: Not on file  . Alcohol Use: No    Review of  Systems  Respiratory: Positive for shortness of breath.   Cardiovascular: Positive for leg swelling. Negative for chest pain.  Neurological: Positive for weakness (generalized) and headaches.  All other systems reviewed and are negative.   Allergies  Maxzide and Metoprolol  Home Medications   Prior to Admission medications   Medication Sig Start Date End Date Taking? Authorizing Provider  finasteride (PROSCAR) 5 MG tablet Take 5 mg by mouth at bedtime.  01/27/13   Historical Provider, MD  fluconazole (DIFLUCAN) 200 MG tablet  11/14/13   Historical Provider, MD  fluticasone (FLONASE) 50 MCG/ACT nasal spray Place 1 spray into both nostrils daily. 10/06/13   Theodis Blaze, MD  levofloxacin (LEVAQUIN) 500 MG tablet Take 500 mg by mouth daily.  11/14/13   Historical Provider, MD  metroNIDAZOLE (FLAGYL) 500 MG tablet Take 1 tablet (500 mg total) by mouth every 8 (eight) hours. 10/06/13   Theodis Blaze, MD  omeprazole (PRILOSEC) 20 MG capsule Take 20 mg by mouth daily before breakfast.  03/28/13   Historical Provider, MD  ondansetron (ZOFRAN) 4 MG tablet Take 1 tablet (4 mg total) by mouth every 8 (eight) hours as needed for nausea or vomiting. 10/10/13   Theodis Blaze, MD  oxyCODONE-acetaminophen (PERCOCET/ROXICET) 5-325 MG per tablet Take 1-2 tablets by mouth every 3 (three) hours as needed for moderate pain. 10/06/13   Theodis Blaze, MD  potassium chloride (MICRO-K) 10 MEQ CR capsule Take 10 mEq by mouth daily.  10/24/13   Historical Provider, MD  predniSONE (DELTASONE) 20 MG tablet Take 1 tablet (20 mg total) by mouth daily with breakfast. 10/06/13   Theodis Blaze, MD  prochlorperazine (COMPAZINE) 5 MG tablet Take 5 mg by mouth every 6 (six) hours as needed for nausea or vomiting.    Historical Provider, MD  sennosides-docusate sodium (SENOKOT-S) 8.6-50 MG tablet Take 1 tablet by mouth daily. 1-2 tablets 2 times daily by mouth.    Historical Provider, MD   Triage Vitals: BP 159/94 mmHg  Pulse 92   Temp(Src) 98.7 F (37.1 C) (Oral)  Resp 18  SpO2 100% Physical Exam  Constitutional: He is oriented to person, place, and time. He appears well-developed and well-nourished.  HENT:  Head: Normocephalic and atraumatic.  Cardiovascular: An irregular rhythm present.  No murmur heard. Pulmonary/Chest: Effort normal. No respiratory distress. He has decreased breath sounds (bilateral lung bases.).  Abdominal: Soft. There is no tenderness. There is no rebound and no guarding.  Musculoskeletal: He exhibits no edema or tenderness.  2-3+ BLE pitting edema.  Neurological: He is alert and oriented to person, place, and time.  Skin: Skin is warm and dry.  Psychiatric: He has a normal mood and affect. His behavior is normal.  Nursing note and vitals reviewed.   ED Course  Procedures (including critical care time) DIAGNOSTIC STUDIES: Oxygen Saturation is 100% on [place] 2 L/min, normal by my interpretation.   COORDINATION OF CARE: 1:56 AM- Will order lab work. Pt verbalizes understanding and agrees to plan.  Medications - No data to display  Labs Review Labs Reviewed  CBC - Abnormal; Notable for the following:    WBC 2.3 (*)    RBC 3.34 (*)    Hemoglobin 9.8 (*)    HCT 30.8 (*)    RDW 21.2 (*)    Platelets 32 (*)    All other components within normal limits  BASIC METABOLIC PANEL - Abnormal; Notable for the following:    Glucose, Bld 117 (*)    GFR calc non Af Amer 65 (*)    GFR calc Af Amer 75 (*)    All other components within normal limits  BRAIN NATRIURETIC PEPTIDE - Abnormal; Notable for the following:    B Natriuretic Peptide 2124.1 (*)    All other components within normal limits  I-STAT TROPOININ, ED    Imaging Review Dg Chest Port 1 View  05/04/2014   CLINICAL DATA:  Tachycardia  EXAM: PORTABLE CHEST - 1 VIEW  COMPARISON:  09/30/2013  FINDINGS: New cardiomegaly which is accentuated by low lung volumes. There is widening of the vascular pedicle.  There is a left upper  extremity PICC, with tip overlapping the descending thoracic aorta. 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  There is diffuse interstitial opacity and small bilateral pleural effusions. No pneumothorax.  These results were called by telephone at the time of interpretation on 05/04/2014 at 3:14 am to Dr. Quintella Mooney , who verbally acknowledged these results.  IMPRESSION: 1. 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 history of AML, note that ATRA syndrome can have this appearance. 2. Atypical positioning of left upper extremity PICC. If clinically venous, the tip is likely within a persistent left  SVC (present on 04/18/2013 abdominal CT). If arterial, the tip is in the descending thoracic aorta.   Electronically Signed   By: Jorje Guild M.D.   On: 05/04/2014 03:13     EKG Interpretation   Date/Time:  Monday May 04 2014 01:48:25 EST Ventricular Rate:  84 PR Interval:  178 QRS Duration: 96 QT Interval:  407 QTC Calculation: 481 R Axis:   1 Text Interpretation:  Atrial fibrillation occasionl premature ventricular  complex Probable anterior infarct, age indeterminate Confirmed by Hazle Coca [phone]) on 05/04/2014 2:04:08 AM      MDM   Final diagnoses:  Acute congestive heart failure, unspecified congestive heart failure type    Patient with history of AML currently undergoing chemotherapy is here for evaluation of progressive shortness of breath and lower extremity edema. Chest x-ray, history, exam are consistent with acute congestive heart failure. Will provide diuresis for his heart failure and plan to admit to medicine for further management.  I personally performed the services described in this documentation, which was scribed in my presence. The recorded information has been reviewed and is accurate.    Eric Reichert, MD 05/04/14 [id number]

## 2014-05-04 NOTE — H&P (Signed)
[facility] Admission History and Physical       [name] ZYS:[id number] DOB: December 02, 1940 DOA: 05/04/2014  Referring physician: EDP PCP: [name], MD  Specialists:   Chief Complaint: Worsening SOB  HPI: [name] is a 73 y.o. male with a history of AML on Chemotherapy through [facility] and his last chemotherapy rx was 12/19 who presents to the ED with complaints of worsening SOB over the past month, and worse tonight.  He also reports having increased edema of both of his legs and orthopnea.   He denies any fevers or chills.  He was found in the ED to have A. Fib with RVR and was administered IV Cardizem x1 and his rate improved .   A Chest X-ray revealed Pulmonary Edema and his BNP was 2124.1. He was administered 40 mg of  IV Lasix  X 1 and began to diurese and improve.   He was referred for medical admission.       Review of Systems:  Constitutional: No Weight Loss, No Weight Gain, Night Sweats, Fevers, Chills, Dizziness, Fatigue, or Generalized Weakness HEENT: No Headaches, Difficulty Swallowing,Tooth/Dental Problems,Sore Throat,  No Sneezing, Rhinitis, Ear Ache, Nasal Congestion, or Post Nasal Drip,  Cardio-vascular:  No Chest pain, +Orthopnea, PND, +Edema in Lower Extremities, Anasarca, Dizziness, +Palpitations  Resp:  +Dyspnea, No DOE, No Productive Cough, No Non-Productive Cough, No Hemoptysis, No Wheezing.    GI: No Heartburn, Indigestion, Abdominal Pain, Nausea, Vomiting, Diarrhea, Hematemesis, Hematochezia, Melena, Change in Bowel Habits,  Loss of Appetite  GU: No Dysuria, Change in Color of Urine, No Urgency or Frequency, No Flank pain.  Musculoskeletal: No Joint Pain or Swelling, No Decreased Range of Motion, No Back Pain.  Neurologic: No Syncope, No Seizures, Muscle Weakness, Paresthesia, Vision Disturbance or Loss, No Diplopia, No Vertigo, No Difficulty Walking,  Skin: No Rash or Lesions. Psych: No Change in Mood or Affect, No Depression or  Anxiety, No Memory loss, No Confusion, or Hallucinations   Past Medical History  Diagnosis Date  . Crohn's disease   . Iron deficiency anemia   . Leukopenia   . Hypertension   . BPH (benign prostatic hyperplasia)   . 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 Kidney stones   . AML (acute myeloblastic leukemia)       Past Surgical History  Procedure Laterality Date  . Lumbar fusion      L5-S1  . Kidney stone surgery    . Cataract extraction Right   . Cardiac catheterization  2001       Prior to Admission medications   Medication Sig Start Date End Date Taking? Authorizing Provider  finasteride (PROSCAR) 5 MG tablet Take 5 mg by mouth at bedtime.  01/27/13  Yes Historical Provider, MD  fluconazole (DIFLUCAN) 200 MG tablet Take 200 mg by mouth at bedtime.  11/14/13  Yes Historical Provider, MD  fluticasone (FLONASE) 50 MCG/ACT nasal spray Place 1 spray into both nostrils daily. Patient taking differently: Place 1 spray into both nostrils daily as needed.  10/06/13  Yes Theodis Blaze, MD  levofloxacin (LEVAQUIN) 500 MG tablet Take 500 mg by mouth at bedtime.  11/14/13  Yes Historical Provider, MD  loratadine (CLARITIN) 10 MG tablet Take 10 mg by mouth daily as needed for allergies.   Yes Historical Provider, MD  mirtazapine (REMERON) 15 MG tablet Take 15 mg by mouth at bedtime.   Yes Historical Provider, MD  omeprazole ([place])  20 MG capsule Take 20 mg by mouth daily before breakfast.  03/28/13  Yes Historical Provider, MD  ondansetron (ZOFRAN) 4 MG tablet Take 1 tablet (4 mg total) by mouth every 8 (eight) hours as needed for nausea or vomiting. 10/10/13  Yes Theodis Blaze, MD  oxyCODONE (OXY IR/ROXICODONE) 5 MG immediate release tablet Take 5 mg by mouth every 4 (four) hours as needed for moderate pain or severe pain.   Yes Historical Provider, MD  oxyCODONE-acetaminophen (PERCOCET/ROXICET) 5-325 MG per tablet Take 1-2 tablets by mouth every 3 (three) hours as  needed for moderate pain. 10/06/13  Yes Theodis Blaze, MD  polyethylene glycol [facility] / Floria Raveling) packet Take 17 g by mouth daily as needed for mild constipation.   Yes Historical Provider, MD  prochlorperazine (COMPAZINE) 5 MG tablet Take 5 mg by mouth every 6 (six) hours as needed for nausea or vomiting.   Yes Historical Provider, MD  sennosides-docusate sodium (SENOKOT-S) 8.6-50 MG tablet Take 1-2 tablets by mouth daily as needed for constipation. 1-2 tablets 2 times daily by mouth.   Yes Historical Provider, MD  metroNIDAZOLE (FLAGYL) 500 MG tablet Take 1 tablet (500 mg total) by mouth every 8 (eight) hours. Patient not taking: Reported on 05/04/2014 10/06/13   Theodis Blaze, MD  potassium chloride (MICRO-K) 10 MEQ CR capsule Take 10 mEq by mouth daily.  10/24/13   Historical Provider, MD  predniSONE (DELTASONE) 20 MG tablet Take 1 tablet (20 mg total) by mouth daily with breakfast. Patient not taking: Reported on 05/04/2014 10/06/13   Theodis Blaze, MD      Allergies  Allergen Reactions  . Maxzide [Triamterene-Hctz] Other (See Comments)    Side effects   . Metoprolol Other (See Comments)    Side effects   . Pentasa [Mesalamine] Nausea And Vomiting  . Quinapril Other (See Comments)    dizziness     Social History:  reports that he has never smoked. He does not have any smokeless tobacco history on file. He reports that he does not drink alcohol or use illicit drugs.     Family History  Problem Relation Age of Onset  . Colon cancer Mother   . Emphysema Father   . CAD Father   . Stroke Father   . Crohn's disease Neg Hx   . Ulcerative colitis Neg Hx   . Leukemia Neg Hx   . Ovarian cancer Paternal Aunt        Physical Exam:  GEN:  Pleasant Elderly 73 y.o. Caucasian male examined and in no acute distress; cooperative with exam Filed Vitals:   05/04/14 0330 05/04/14 0335 05/04/14 0345 05/04/14 0357  BP: 152/86 152/86 161/105 151/101  Pulse: 75 88 84 83  Temp:      TempSrc:       Resp: 25 19 17 24   SpO2: 100% 99% 100% 98%   Blood pressure 151/101, pulse 83, temperature 98.7 F (37.1 C), temperature source Oral, resp. rate 24, SpO2 98 %. PSYCH: He is alert and oriented x4; does not appear anxious does not appear depressed; affect is normal HEENT: Normocephalic and Atraumatic, Mucous membranes pink; PERRLA; EOM intact; Fundi:  Benign;  No scleral icterus, Nares: Patent, Oropharynx: Clear, Edentulous,    Neck:  FROM, No Cervical Lymphadenopathy nor Thyromegaly or Carotid Bruit; No JVD; Breasts:: Not examined CHEST WALL: No tenderness CHEST: Normal respiration, clear to auscultation bilaterally HEART: Regular rate and rhythm; no murmurs rubs or gallops BACK: No kyphosis or scoliosis;  No CVA tenderness ABDOMEN: Positive Bowel Sounds, Soft Non-Tender; No Masses, No Organomegaly. Rectal Exam: Not done EXTREMITIES: No Cyanosis, Clubbing, 2+BLE Edema; No Ulcerations. Genitalia: not examined PULSES: 2+ and symmetric SKIN: Normal hydration no rash or ulceration CNS:  Alert and Oriented x 4, No Focal Deficits  Vascular: pulses palpable throughout    Labs on Admission:  Basic Metabolic Panel:  Recent Labs Lab 04/29/14 0906 05/04/14 0202  NA 143 143  K 3.4* 3.5  CL  --  106  CO2 27 29  GLUCOSE 106 117*  BUN 13.0 13  CREATININE 1.0 1.09  CALCIUM 8.0* 8.5  MG 1.9  --    Liver Function Tests:  Recent Labs Lab 04/29/14 0906  AST 10  ALT 7  ALKPHOS 72  BILITOT 0.60  PROT 5.6*  ALBUMIN 2.6*   No results for input(s): LIPASE, AMYLASE in the last 168 hours. No results for input(s): AMMONIA in the last 168 hours. CBC:  Recent Labs Lab 04/29/14 0907 05/04/14 0202  WBC 3.0* 2.3*  NEUTROABS 2.1  --   HGB 9.9* 9.8*  HCT 30.6* 30.8*  MCV 92.7 92.2  PLT 84* 32*   Cardiac Enzymes: No results for input(s): CKTOTAL, CKMB, CKMBINDEX, TROPONINI in the last 168 hours.  BNP (last 3 results) No results for input(s): PROBNP in the last 8760  hours. CBG: No results for input(s): GLUCAP in the last 168 hours.  Radiological Exams on Admission: Dg Chest Port 1 View  05/04/2014   CLINICAL DATA:  Tachycardia  EXAM: PORTABLE CHEST - 1 VIEW  COMPARISON:  09/30/2013  FINDINGS: New cardiomegaly which is accentuated by low lung volumes. There is widening of the vascular pedicle.  There is a left upper extremity PICC, with tip overlapping the descending thoracic aorta. 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  There is diffuse interstitial opacity and small bilateral pleural effusions. No pneumothorax.  These results were called by telephone at the time of interpretation on 05/04/2014 at 3:14 am to Dr. [name] , who verbally acknowledged these results.  IMPRESSION: 1. 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 history of AML, note that ATRA syndrome can have this appearance. 2. Atypical positioning of left upper extremity PICC. If clinically venous, the tip is likely within a persistent left SVC (present on 04/18/2013 abdominal CT). If arterial, the tip is in the descending thoracic aorta.   Electronically Signed   By: Jorje Guild M.D.   On: 05/04/2014 03:13     EKG: Independently reviewed.    Assessment/Plan:   73 y.o. male with  Principal Problem:   1.    Acute diastolic CHF (congestive heart failure)   Active Problems:   2.   Hypertension     3.   AML (acute myeloblastic leukemia)-   Seen at [facility] for Chemo Rx due to his [facility] Benefits   Local Oncologist Dr Alen Blew  When in [place] was 12/19 and awaiting Reassessment for Further Chemo 05/21/2014      4.   Pancytopenia-Due to Chemo Rx      5.   Crohn's disease   Stable     6.   BPH (benign prostatic hyperplasia)   Was to see Urology Dr. Tresa Endo today at 10 AM as outpatient    (Dr Rosana Hoes from [facility] will be in [place] office today)    7.   DVT Prophylaxis- has Thrombocytopenia (PLTs  =32)   SCDs  Code Status:   DO NOT RESUSCITATE (DNR) Family Communication:   Wife at Bedside Disposition Plan:   Inpatient       Time spent:  [facility] Pager [phone]   If [place] Please Contact the Day Rounding Team MD for [facility]  If 7PM-7AM, Please Contact Night-Floor Coverage  [email or web address] Password [facility] 05/04/2014, 5:37 AM

## 2014-05-04 NOTE — ED Notes (Signed)
Per MD, pt diet heart healthy. Tray ordered.

## 2014-05-05 DIAGNOSIS — I493 Ventricular premature depolarization: Secondary | ICD-10-CM | POA: Insufficient documentation

## 2014-05-05 LAB — BASIC METABOLIC PANEL
Anion gap: 8 (ref 5–15)
BUN: 15 mg/dL (ref 6–23)
CALCIUM: 8.4 mg/dL (ref 8.4–10.5)
CHLORIDE: 103 meq/L (ref 96–112)
CO2: 30 mmol/L (ref 19–32)
Creatinine, Ser: 1.2 mg/dL (ref 0.50–1.35)
GFR calc Af Amer: 67 mL/min — ABNORMAL LOW (ref >90)
GFR calc non Af Amer: 58 mL/min — ABNORMAL LOW (ref >90)
GLUCOSE: 94 mg/dL (ref 70–99)
Potassium: 3.3 mmol/L — ABNORMAL LOW (ref 3.5–5.1)
Sodium: 141 mmol/L (ref 135–145)

## 2014-05-05 MED ORDER — ISOSORBIDE MONONITRATE ER 30 MG PO TB24
30.0000 mg | ORAL_TABLET | Freq: Every day | ORAL | Status: DC
  Administered 2014-05-05 – 2014-05-07 (×3): 30 mg via ORAL
  Filled 2014-05-05 (×3): qty 1

## 2014-05-05 MED ORDER — POTASSIUM CHLORIDE CRYS ER 20 MEQ PO TBCR
40.0000 meq | EXTENDED_RELEASE_TABLET | Freq: Two times a day (BID) | ORAL | Status: AC
  Administered 2014-05-05: 40 meq via ORAL
  Filled 2014-05-05: qty 2

## 2014-05-05 MED ORDER — HEPARIN SODIUM (PORCINE) 5000 UNIT/ML IJ SOLN: 5000.0000 [IU] | Freq: Three times a day (TID) | INTRAMUSCULAR | Status: DC

## 2014-05-05 MED ORDER — HYDRALAZINE HCL 50 MG PO TABS
50.0000 mg | ORAL_TABLET | Freq: Three times a day (TID) | ORAL | Status: DC
  Administered 2014-05-05 – 2014-05-06 (×2): 50 mg via ORAL
  Filled 2014-05-05 (×3): qty 1

## 2014-05-05 MED ORDER — CARVEDILOL 6.25 MG PO TABS
6.2500 mg | ORAL_TABLET | Freq: Two times a day (BID) | ORAL | Status: DC
  Administered 2014-05-05 – 2014-05-07 (×4): 6.25 mg via ORAL
  Filled 2014-05-05 (×4): qty 1

## 2014-05-05 NOTE — Progress Notes (Signed)
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    BNP: 3304 EF: 35-40% on 2D echo  Subjective: No complaints. Breathing much improved. Edema has nearly resolved.   Objective: Vital signs in last 24 hours: Temp:  [97.6 F (36.4 C)-98 F (36.7 C)] 97.9 F (36.6 C) (12/29 0509) Pulse Rate:  [56-69] 69 (12/29 0509) Resp:  [20-21] 20 (12/29 0509) BP: (138-146)/(73-92) 146/92 mmHg (12/29 0509) SpO2:  [98 %] 98 % (12/29 0509) Weight:  [171 lb 8 oz (77.792 kg)] 171 lb 8 oz (77.792 kg) (12/29 0509) Last BM Date: 05/02/14  Intake/Output from previous day: 12/28 0701 - 12/29 0700 In: 440 [P.O.:440] Out: 5275 [Urine:5275] Intake/Output this shift: Total I/O In: 120 [P.O.:120] Out: 900 [Urine:900]  Medications Current Facility-Administered Medications  Medication Dose Route Frequency Provider Last Rate Last Dose  . 0.9 %  sodium chloride infusion  250 mL Intravenous PRN Theressa Millard, MD      . acetaminophen (TYLENOL) tablet 650 mg  650 mg Oral Q4H PRN Theressa Millard, MD      . finasteride (PROSCAR) tablet 5 mg  5 mg Oral QHS Theressa Millard, MD   5 mg at 05/04/14 2214  . fluconazole (DIFLUCAN) tablet 200 mg  200 mg Oral QHS Theressa Millard, MD   200 mg at 05/04/14 2214  . furosemide (LASIX) injection 40 mg  40 mg Intravenous Q12H Theressa Millard, MD   40 mg at 05/05/14 0745  . hydrALAZINE (APRESOLINE) tablet 50 mg  50 mg Oral 3 times per day Thurnell Lose, MD      . isosorbide mononitrate (IMDUR) 24 hr tablet 30 mg  30 mg Oral Daily Thurnell Lose, MD      . loratadine (CLARITIN) tablet 10 mg  10 mg Oral Daily PRN Theressa Millard, MD      . mirtazapine (REMERON) tablet 15 mg  15 mg Oral QHS Theressa Millard, MD   15 mg at 05/04/14 2214  . ondansetron (ZOFRAN) injection 4 mg  4 mg  Intravenous Q6H PRN Theressa Millard, MD      . pantoprazole (PROTONIX) EC tablet 40 mg  40 mg Oral Daily Theressa Millard, MD   40 mg at 05/05/14 1025  . polyethylene glycol (MIRALAX / GLYCOLAX) packet 17 g  17 g Oral Daily PRN Theressa Millard, MD      . potassium chloride SA (K-DUR,KLOR-CON) CR tablet 40 mEq  40 mEq Oral BID Thurnell Lose, MD      . senna-docusate (Senokot-S) tablet 1-2 tablet  1-2 tablet Oral Daily PRN Theressa Millard, MD      . sodium chloride 0.9 % injection 10-40 mL  10-40 mL Intracatheter PRN Thurnell Lose, MD   12 mL at 05/05/14 1025  . sodium chloride 0.9 % injection 3 mL  3 mL Intravenous Q12H Theressa Millard, MD   3 mL at 05/04/14 2215  . sodium chloride 0.9 % injection 3 mL  3 mL Intravenous PRN Theressa Millard, MD        PE: General appearance: alert, cooperative and no distress Neck: no carotid bruit and no JVD Lungs: clear to auscultation bilaterally Heart: regular rate and rhythm, S1, S2 normal, no murmur, click, rub or gallop Extremities: trace LEE Pulses: 2+ and symmetric Skin: warm and dry Neurologic: Grossly normal  Lab Results:   Recent Labs  05/04/14 0202  WBC 2.3*  HGB 9.8*  HCT 30.8*  PLT 32*   BMET  Recent Labs  05/04/14 0202 05/05/14 0450  NA 143 141  K 3.5 3.3*  CL 106 103  CO2 29 30  GLUCOSE 117* 94  BUN 13 15  CREATININE 1.09 1.20  CALCIUM 8.5 8.4   Cardiac Panel (last 3 results) No results for input(s): CKTOTAL, CKMB, TROPONINI, RELINDX in the last 72 hours.  Studies/Results:  2D echo 05/04/14 Study Conclusions  - Left ventricle: The cavity size was normal. Wall thickness was increased in a pattern of moderate LVH. There was focal basal hypertrophy. Systolic function was moderately reduced. The estimated ejection fraction was in the range of 35% to 40%. Diffuse hypokinesis. - Mitral valve: There was mild regurgitation. - Left atrium: The atrium was severely dilated. - Pulmonary  arteries: Systolic pressure was moderately increased. PA peak pressure: 55 mm Hg (S).  Filed Weights   05/04/14 1117 05/05/14 0509  Weight: 178 lb 12.8 oz (81.103 kg) 171 lb 8 oz (77.792 kg)    Assessment/Plan  Principal Problem:   Acute Systolic CHF (congestive heart failure) Active Problems:   Crohn's disease   Hypertension   BPH (benign prostatic hyperplasia)   AML (acute myeloblastic leukemia)   Pancytopenia   Atrial fibrillation   1. Acute Systolic CHF: breathing and edema both significantly improved. He is diuresing well and close to euvolemic state.Continue lasix, daily weights, strict I/Os and daily weights.   2. Cardiomyopathy: EF 35-40%. 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 This is currently on hold. Cardiac enzymes were not cycled yesterday. He denies recent history of CP. ? NST for risk stratification to rule out CAD/ischemia as a potential etiology. No more Cardizem. Recommend low dose Coreg and either a low dose ACE/ARB. BP can tolerate with SBP in the 140s. Renal function is also stable.    3. Hypokalemia: K is 3.3.  Supplemental K-dur ordered.   4. Question of AFIB - I agree with Dr. Tamala Julian, especially after reviewing tele. Looks like NSR with PVC/PAC's. NOT AFIB. No need for anticoagulation.      LOS: 1 day    Brittainy M. Rosita Fire, PA-C 05/05/2014 12:12 PM  Personally seen and examined. Agree with above. Changes made to note above.  Starting coreg 6.25mg  PO BID. Tomorrow will start lisinopril.  Will likely proceed with NUC stress as outpatient.  Candee Furbish, MD

## 2014-05-05 NOTE — Progress Notes (Signed)
Patient Demographics  [name], is a 73 y.o. male, DOB - [birth year], IRJ:[id number]  Admit date - 05/04/2014   Admitting Physician Thurnell Lose, MD  Outpatient Primary MD for the patient is [name], MD  LOS - 1   Chief Complaint  Patient presents with  . Tachycardia        Subjective:   [name] today has, No headache, No chest pain, No abdominal pain - No Nausea, No new weakness tingling or numbness, No Cough - Improved SOB  Assessment & Plan    1. Acute respiratory failure due to acute on chronic combined systolic and diastolic CHF. EF 35% with diffuse hypokinesis. He is improved with IV Lasix which will be continued, he is diuresed around 7 L in 1 day, allergic to beta blockers so will add Imdur, hydralazine, will add ACE inhibitor if renal function stays stable. Cardiology consulted.   2. AML with pancytopenia. Followed at [facility] undergoing chemotherapy.   3. BPH. Continue finasteride.   4. ? New onset atrial fibrillation. In great control, has beta blocker allergy, will defer to cardiology on any rate controlling agent or anticoagulation in the light of severe pancytopenia and thrombocytopenia.    5. Hypokalemia. Replace.     Code Status: Full  Family Communication: Wife  Disposition Plan: Home   Procedures   TTE  - Left ventricle: The cavity size was normal. Wall thickness wasincreased in a pattern of moderate LVH. There was focal basalhypertrophy. Systolic function was moderately reduced. Theestimated ejection fraction was in the range of 35% to 40%.Diffuse hypokinesis. - Mitral valve: There was mild regurgitation. - Left atrium: The atrium was severely dilated. - Pulmonary arteries: Systolic pressure was moderately increased.PA  peak pressure: 55 mm Hg (S).   Consults  Cards   Medications  Scheduled Meds: . finasteride  5 mg Oral QHS  . fluconazole  200 mg Oral QHS  . furosemide  40 mg Intravenous Q12H  . heparin subcutaneous  5,000 Units Subcutaneous 3 times per day  . hydrALAZINE  50 mg Oral 3 times per day  . isosorbide mononitrate  30 mg Oral Daily  . mirtazapine  15 mg Oral QHS  . pantoprazole  40 mg Oral Daily  . potassium chloride  40 mEq Oral BID  . sodium chloride  3 mL Intravenous Q12H   Continuous Infusions:  PRN Meds:.sodium chloride, acetaminophen, loratadine, ondansetron (ZOFRAN) IV, polyethylene glycol, senna-docusate, sodium chloride, sodium chloride  DVT Prophylaxis  SCD  Lab Results  Component Value Date   PLT 32* 05/04/2014    Antibiotics     Anti-infectives    Start     Dose/Rate Route Frequency Ordered Stop   05/04/14 2230  levofloxacin (LEVAQUIN) tablet 500 mg     500 mg Oral  Once 05/04/14 2218 05/04/14 2314   05/04/14 2200  fluconazole (DIFLUCAN) tablet 200 mg     200 mg Oral Daily at bedtime 05/04/14 0707            Objective:   Filed Vitals:   05/04/14 1117 05/04/14 1422 05/04/14 2040 05/05/14 0509  BP: 137/82 140/73 138/89 146/92  Pulse: 80 67 56 69  Temp: 97.4 F (36.3 C) 97.6 F (36.4 C) 98  F (36.7 C) 97.9 F (36.6 C)  TempSrc: Oral Oral Oral Oral  Resp: 20 21 20 20   Height: 5\' 9"  (1.753 m)   5\' 9"  (1.753 m)  Weight: 81.103 kg (178 lb 12.8 oz)   77.792 kg (171 lb 8 oz)  SpO2: 99% 98% 98% 98%    Wt Readings from Last 3 Encounters:  05/05/14 77.792 kg (171 lb 8 oz)  12/25/13 80.876 kg (178 lb 4.8 oz)  09/30/13 88.4 kg (194 lb 14.2 oz)     Intake/Output Summary (Last 24 hours) at 05/05/14 1154 Last data filed at 05/05/14 1005  Gross per 24 hour  Intake    560 ml  Output   4675 ml  Net  -4115 ml     Physical Exam  Awake Alert, Oriented X 3, No new F.N deficits, Normal affect [place].AT,PERRAL Supple Neck,No JVD, No cervical lymphadenopathy  appriciated.  Symmetrical Chest wall movement, Good air movement bilaterally, +ve rales RRR,No Gallops,Rubs or new Murmurs, No Parasternal Heave +ve B.Sounds, Abd Soft, No tenderness, No organomegaly appriciated, No rebound - guarding or rigidity. No Cyanosis, Clubbing, 1+ edema, No new Rash or bruise      Data Review   Micro Results No results found for this or any previous visit (from the past 240 hour(s)).  Radiology Reports Dg Chest Port 1 View  05/04/2014   CLINICAL DATA:  Tachycardia  EXAM: PORTABLE CHEST - 1 VIEW  COMPARISON:  09/30/2013  FINDINGS: New cardiomegaly which is accentuated by low lung volumes. There is widening of the vascular pedicle.  There is a left upper extremity PICC, with tip overlapping the descending thoracic aorta. 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  There is diffuse interstitial opacity and small bilateral pleural effusions. No pneumothorax.  These results were called by telephone at the time of interpretation on 05/04/2014 at 3:14 am to Dr. [name] , who verbally acknowledged these results.  IMPRESSION: 1. 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 history of AML, note that ATRA syndrome can have this appearance. 2. Atypical positioning of left upper extremity PICC. If clinically venous, the tip is likely within a persistent left SVC (present on 04/18/2013 abdominal CT). If arterial, the tip is in the descending thoracic aorta.   Electronically Signed   By: Jorje Guild M.D.   On: 05/04/2014 03:13     CBC  Recent Labs Lab 04/29/14 0907 05/04/14 0202  WBC 3.0* 2.3*  HGB 9.9* 9.8*  HCT 30.6* 30.8*  PLT 84* 32*  MCV 92.7 92.2  MCH 30.0 29.3  MCHC 32.4 31.8  RDW 20.9* 21.2*  LYMPHSABS 0.8*  --   MONOABS 0.1  --   EOSABS 0.0  --   BASOSABS 0.0  --     Chemistries   Recent Labs Lab 04/29/14 0906 05/04/14 0202 05/05/14 0450  NA 143 143 141  K 3.4* 3.5 3.3*  CL  --  106 103  CO2 27 29 30     GLUCOSE 106 117* 94  BUN 13.0 13 15  CREATININE 1.0 1.09 1.20  CALCIUM 8.0* 8.5 8.4  MG 1.9  --   --   AST 10  --   --   ALT 7  --   --   ALKPHOS 72  --   --   BILITOT 0.60  --   --    ------------------------------------------------------------------------------------------------------------------ estimated creatinine clearance is 54 mL/min (by C-G formula based on Cr of 1.2). ------------------------------------------------------------------------------------------------------------------ No results  for input(s): HGBA1C in the last 72 hours. ------------------------------------------------------------------------------------------------------------------ No results for input(s): CHOL, HDL, LDLCALC, TRIG, CHOLHDL, LDLDIRECT in the last 72 hours. ------------------------------------------------------------------------------------------------------------------  Recent Labs  05/04/14 1255  TSH 1.404   ------------------------------------------------------------------------------------------------------------------ No results for input(s): VITAMINB12, FOLATE, FERRITIN, TIBC, IRON, RETICCTPCT in the last 72 hours.  Coagulation profile No results for input(s): INR, PROTIME in the last 168 hours.  No results for input(s): DDIMER in the last 72 hours.  Cardiac Enzymes No results for input(s): CKMB, TROPONINI, MYOGLOBIN in the last 168 hours.  Invalid input(s): CK ------------------------------------------------------------------------------------------------------------------ Invalid input(s): POCBNP     Time Spent in minutes  35   SINGH,PRASHANT K M.D on 05/05/2014 at 11:54 AM  Between 7am to 7pm - Pager - [phone]  After 7pm go to [email or web address] - [facility] Office  [phone]

## 2014-05-06 LAB — CBC WITH DIFFERENTIAL/PLATELET
BASOS PCT: 0 % (ref 0–1)
Basophils Absolute: 0 10*3/uL (ref 0.0–0.1)
EOS PCT: 1 % (ref 0–5)
Eosinophils Absolute: 0 10*3/uL (ref 0.0–0.7)
HEMATOCRIT: 26.5 % — AB (ref 39.0–52.0)
Hemoglobin: 8.5 g/dL — ABNORMAL LOW (ref 13.0–17.0)
LYMPHS PCT: 48 % — AB (ref 12–46)
Lymphs Abs: 0.8 10*3/uL (ref 0.7–4.0)
MCH: 29.6 pg (ref 26.0–34.0)
MCHC: 32.1 g/dL (ref 30.0–36.0)
MCV: 92.3 fL (ref 78.0–100.0)
MONOS PCT: 3 % (ref 3–12)
Monocytes Absolute: 0 10*3/uL — ABNORMAL LOW (ref 0.1–1.0)
NEUTROS ABS: 0.8 10*3/uL — AB (ref 1.7–7.7)
Neutrophils Relative %: 48 % (ref 43–77)
Platelets: 19 10*3/uL — CL (ref 150–400)
RBC: 2.87 MIL/uL — AB (ref 4.22–5.81)
RDW: 21.8 % — ABNORMAL HIGH (ref 11.5–15.5)
WBC: 1.6 10*3/uL — AB (ref 4.0–10.5)

## 2014-05-06 LAB — BASIC METABOLIC PANEL
Anion gap: 8 (ref 5–15)
BUN: 17 mg/dL (ref 6–23)
CALCIUM: 8.6 mg/dL (ref 8.4–10.5)
CHLORIDE: 102 meq/L (ref 96–112)
CO2: 32 mmol/L (ref 19–32)
Creatinine, Ser: 1.37 mg/dL — ABNORMAL HIGH (ref 0.50–1.35)
GFR calc Af Amer: 57 mL/min — ABNORMAL LOW (ref >90)
GFR calc non Af Amer: 49 mL/min — ABNORMAL LOW (ref >90)
GLUCOSE: 95 mg/dL (ref 70–99)
POTASSIUM: 3.6 mmol/L (ref 3.5–5.1)
SODIUM: 142 mmol/L (ref 135–145)

## 2014-05-06 LAB — MAGNESIUM: Magnesium: 1.6 mg/dL (ref 1.5–2.5)

## 2014-05-06 MED ORDER — MAGNESIUM SULFATE IN D5W 10-5 MG/ML-% IV SOLN
1.0000 g | Freq: Once | INTRAVENOUS | Status: AC
  Administered 2014-05-06: 1 g via INTRAVENOUS
  Filled 2014-05-06: qty 100

## 2014-05-06 MED ORDER — FUROSEMIDE 40 MG PO TABS: 40.0000 mg | ORAL_TABLET | Freq: Two times a day (BID) | ORAL | Status: DC

## 2014-05-06 MED ORDER — FUROSEMIDE 40 MG PO TABS
40.0000 mg | ORAL_TABLET | Freq: Two times a day (BID) | ORAL | Status: DC
  Administered 2014-05-07: 40 mg via ORAL
  Filled 2014-05-06: qty 1

## 2014-05-06 NOTE — Care Management Note (Signed)
    Page 1 of 1   05/07/2014     12:32:45 PM CARE MANAGEMENT NOTE 05/07/2014  Patient:  [name], [name]   Account Number:  [id number]  Date Initiated:  05/06/2014  Documentation initiated by:  GRAVES-BIGELOW,Blaike Vickers  Subjective/Objective Assessment:   Pt admitted for acute CHF (newly diagnosed).      Action/Plan:   Pt will benefit from [facility] and PT services. CM will continue to f/u with disposition needs.   Anticipated DC Date:  05/08/2014   Anticipated DC Plan:  [place]  CM consult      [facility] Choice  Resumption Of Svcs/PTA Provider   Choice offered to / List presented to:  [name]        [place] arranged  HH-1 RN  [place] [facility]      [facility] agency  [facility]   Status of service:  Completed, signed off Medicare Important Message given?  YES (If response is "NO", the following Medicare IM given date [place] will be blank) Date Medicare IM given:  05/07/2014 Medicare IM given by:  GRAVES-BIGELOW,Sarena Jezek Date Additional Medicare IM given:   Additional Medicare IM given by:    Discharge Disposition:  [place]  Per UR Regulation:  Reviewed for med. necessity/level of care/duration of stay  If discussed at [place] of Stay Meetings, dates discussed:    Comments:  05-07-14 1231 Eric Krauss, RN,BSN [phone] CM did make referral with [facility] for [facility] services. SOC to begin within 24-48 hrs post d/c.

## 2014-05-06 NOTE — Progress Notes (Signed)
Patient Demographics  [name], is a 73 y.o. male, DOB - Jul 13, 1940, STM:[id number]  Admit date - 05/04/2014   Admitting Physician Thurnell Lose, MD  Outpatient Primary MD for the patient is [name], MD  LOS - 2   Chief Complaint  Patient presents with  . Tachycardia        Subjective:   [name] today has, No headache, No chest pain, No abdominal pain - No Nausea, No new weakness tingling or numbness, No Cough - Improved SOB  Assessment & Plan    1. Acute respiratory failure due to acute on chronic combined systolic and diastolic CHF. EF 35% with diffuse hypokinesis. He is improved with IV Lasix which will be continued, he is diuresed around 30 L, on Imdur, hydralazine, allergic to beta blocker , on ACE inhibitor if renal function stays stable. Cardiology following.   2. AML with pancytopenia. Followed at [facility] undergoing chemotherapy. Has moderate neutropenia monitor.   3. BPH. Continue finasteride.   4. ? New onset atrial fibrillation on EKG. Per cardiology likely no A. fib.   5. Hypokalemia. Replaced.     Code Status: Full  Family Communication: Wife  Disposition Plan: Home   Procedures   TTE  - Left ventricle: The cavity size was normal. Wall thickness wasincreased in a pattern of moderate LVH. There was focal basalhypertrophy. Systolic function was moderately reduced. Theestimated ejection fraction was in the range of 35% to 40%.Diffuse hypokinesis. - Mitral valve: There was mild regurgitation. - Left atrium: The atrium was severely dilated. - Pulmonary arteries: Systolic pressure was moderately increased.PA peak pressure: 55 mm Hg (S).   Consults  Cards   Medications  Scheduled Meds: . carvedilol  6.25 mg Oral BID WC  .  finasteride  5 mg Oral QHS  . fluconazole  200 mg Oral QHS  . [START ON 05/07/2014] furosemide  40 mg Oral BID  . hydrALAZINE  50 mg Oral 3 times per day  . isosorbide mononitrate  30 mg Oral Daily  . magnesium sulfate 1 - 4 g bolus IVPB  1 g Intravenous Once  . mirtazapine  15 mg Oral QHS  . pantoprazole  40 mg Oral Daily  . sodium chloride  3 mL Intravenous Q12H   Continuous Infusions:  PRN Meds:.sodium chloride, acetaminophen, loratadine, ondansetron (ZOFRAN) IV, polyethylene glycol, senna-docusate, sodium chloride, sodium chloride  DVT Prophylaxis  SCD  Lab Results  Component Value Date   PLT 19* 05/06/2014    Antibiotics     Anti-infectives    Start     Dose/Rate Route Frequency Ordered Stop   05/04/14 2230  levofloxacin (LEVAQUIN) tablet 500 mg     500 mg Oral  Once 05/04/14 2218 05/04/14 2314   05/04/14 2200  fluconazole (DIFLUCAN) tablet 200 mg     200 mg Oral Daily at bedtime 05/04/14 0707            Objective:   Filed Vitals:   05/05/14 1834 05/05/14 2016 05/06/14 0506 05/06/14 0839  BP: 125/76 115/67 141/85 122/72  Pulse:  46 78 83  Temp:  98.2 F (36.8 C) 98.1 F (36.7 C)   TempSrc:  Oral Oral   Resp:  20 18  Height:      Weight:   76.431 kg (168 lb 8 oz)   SpO2:  100% 100%     Wt Readings from Last 3 Encounters:  05/06/14 76.431 kg (168 lb 8 oz)  12/25/13 80.876 kg (178 lb 4.8 oz)  09/30/13 88.4 kg (194 lb 14.2 oz)     Intake/Output Summary (Last 24 hours) at 05/06/14 1040 Last data filed at 05/06/14 0824  Gross per 24 hour  Intake    740 ml  Output   2325 ml  Net  -1585 ml     Physical Exam  Awake Alert, Oriented X 3, No new F.N deficits, Normal affect [place].AT,PERRAL Supple Neck,No JVD, No cervical lymphadenopathy appriciated.  Symmetrical Chest wall movement, Good air movement bilaterally, few +ve rales RRR,No Gallops,Rubs or new Murmurs, No Parasternal Heave +ve B.Sounds, Abd Soft, No tenderness, No organomegaly appriciated, No  rebound - guarding or rigidity. No Cyanosis, Clubbing, trace edema, No new Rash or bruise      Data Review   Micro Results No results found for this or any previous visit (from the past 240 hour(s)).  Radiology Reports Dg Chest Port 1 View  05/04/2014   CLINICAL DATA:  Tachycardia  EXAM: PORTABLE CHEST - 1 VIEW  COMPARISON:  09/30/2013  FINDINGS: New cardiomegaly which is accentuated by low lung volumes. There is widening of the vascular pedicle.  There is a left upper extremity PICC, with tip overlapping the descending thoracic aorta. 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  There is diffuse interstitial opacity and small bilateral pleural effusions. No pneumothorax.  These results were called by telephone at the time of interpretation on 05/04/2014 at 3:14 am to Dr. [name] , who verbally acknowledged these results.  IMPRESSION: 1. 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 history of AML, note that ATRA syndrome can have this appearance. 2. Atypical positioning of left upper extremity PICC. If clinically venous, the tip is likely within a persistent left SVC (present on 04/18/2013 abdominal CT). If arterial, the tip is in the descending thoracic aorta.   Electronically Signed   By: Jorje Guild M.D.   On: 05/04/2014 03:13     CBC  Recent Labs Lab 05/04/14 0202 05/06/14 0630  WBC 2.3* 1.6*  HGB 9.8* 8.5*  HCT 30.8* 26.5*  PLT 32* 19*  MCV 92.2 92.3  MCH 29.3 29.6  MCHC 31.8 32.1  RDW 21.2* 21.8*  LYMPHSABS  --  0.8  MONOABS  --  0.0*  EOSABS  --  0.0  BASOSABS  --  0.0    Chemistries   Recent Labs Lab 05/04/14 0202 05/05/14 0450 05/06/14 0500  NA 143 141 142  K 3.5 3.3* 3.6  CL 106 103 102  CO2 29 30 32  GLUCOSE 117* 94 95  BUN 13 15 17   CREATININE 1.09 1.20 1.37*  CALCIUM 8.5 8.4 8.6  MG  --   --  1.6    ------------------------------------------------------------------------------------------------------------------ estimated creatinine clearance is 47.3 mL/min (by C-G formula based on Cr of 1.37). ------------------------------------------------------------------------------------------------------------------ No results for input(s): HGBA1C in the last 72 hours. ------------------------------------------------------------------------------------------------------------------ No results for input(s): CHOL, HDL, LDLCALC, TRIG, CHOLHDL, LDLDIRECT in the last 72 hours. ------------------------------------------------------------------------------------------------------------------  Recent Labs  05/04/14 1255  TSH 1.404   ------------------------------------------------------------------------------------------------------------------ No results for input(s): VITAMINB12, FOLATE, FERRITIN, TIBC, IRON, RETICCTPCT in the last 72 hours.  Coagulation profile No results for input(s): INR, PROTIME in the last 168 hours.  No results  for input(s): DDIMER in the last 72 hours.  Cardiac Enzymes No results for input(s): CKMB, TROPONINI, MYOGLOBIN in the last 168 hours.  Invalid input(s): CK ------------------------------------------------------------------------------------------------------------------ Invalid input(s): POCBNP     Time Spent in minutes  35   Loralie Malta K M.D on 05/06/2014 at 10:40 AM  Between 7am to 7pm - Pager - [phone]  After 7pm go to [email or web address] - [facility] Office  [phone]

## 2014-05-06 NOTE — Progress Notes (Signed)
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    BNP: 3304 EF: 35-40% on 2D echo  Subjective: Patient had several brief episodes of near syncope last night lasting only 2-3 seconds. No associated palpitations or chest pain. He was mildly dyspneic during episodes. He denies frank syncope. Now w/o symptoms. Breathing has improved  Objective: Vital signs in last 24 hours: Temp:  [98.1 F (36.7 C)-98.2 F (36.8 C)] 98.1 F (36.7 C) (12/30 0506) Pulse Rate:  [46-78] 78 (12/30 0506) Resp:  [18-20] 18 (12/30 0506) BP: (115-155)/(67-95) 141/85 mmHg (12/30 0506) SpO2:  [99 %-100 %] 100 % (12/30 0506) Weight:  [168 lb 8 oz (76.431 kg)] 168 lb 8 oz (76.431 kg) (12/30 0506) Last BM Date: 05/05/14  Intake/Output from previous day: 12/29 0701 - 12/30 0700 In: 600 [P.O.:600] Out: 2925 [Urine:2925] Intake/Output this shift:    Medications Current Facility-Administered Medications  Medication Dose Route Frequency Provider Last Rate Last Dose  . 0.9 %  sodium chloride infusion  250 mL Intravenous PRN Theressa Millard, MD      . acetaminophen (TYLENOL) tablet 650 mg  650 mg Oral Q4H PRN Theressa Millard, MD      . carvedilol (COREG) tablet 6.25 mg  6.25 mg Oral BID WC Candee Furbish, MD   6.25 mg at 05/05/14 1834  . finasteride (PROSCAR) tablet 5 mg  5 mg Oral QHS Theressa Millard, MD   5 mg at 05/05/14 2121  . fluconazole (DIFLUCAN) tablet 200 mg  200 mg Oral QHS Theressa Millard, MD   200 mg at 05/05/14 2121  . hydrALAZINE (APRESOLINE) tablet 50 mg  50 mg Oral 3 times per day Thurnell Lose, MD   50 mg at 05/06/14 0536  . isosorbide mononitrate (IMDUR) 24 hr tablet 30 mg  30 mg Oral Daily Thurnell Lose, MD   30 mg at 05/05/14 1308  . loratadine (CLARITIN) tablet 10 mg  10 mg Oral Daily PRN  Theressa Millard, MD      . mirtazapine (REMERON) tablet 15 mg  15 mg Oral QHS Theressa Millard, MD   15 mg at 05/05/14 2121  . ondansetron (ZOFRAN) injection 4 mg  4 mg Intravenous Q6H PRN Theressa Millard, MD      . pantoprazole (PROTONIX) EC tablet 40 mg  40 mg Oral Daily Theressa Millard, MD   40 mg at 05/05/14 1025  . polyethylene glycol (MIRALAX / GLYCOLAX) packet 17 g  17 g Oral Daily PRN Theressa Millard, MD      . senna-docusate (Senokot-S) tablet 1-2 tablet  1-2 tablet Oral Daily PRN Theressa Millard, MD      . sodium chloride 0.9 % injection 10-40 mL  10-40 mL Intracatheter PRN Thurnell Lose, MD   12 mL at 05/05/14 1025  . sodium chloride 0.9 % injection 3 mL  3 mL Intravenous Q12H Theressa Millard, MD   3 mL at 05/05/14 2125  . sodium chloride 0.9 % injection 3 mL  3 mL Intravenous PRN Theressa Millard, MD        PE: General appearance: alert, cooperative and no distress Neck: no carotid bruit and no JVD Lungs: clear to auscultation bilaterally Heart: regular rate and rhythm, S1, S2 normal, no murmur, click, rub or gallop Extremities: no LEE Pulses: 2+ and symmetric Skin:  warm and dry Neurologic: Grossly normal  Lab Results:   Recent Labs  05/04/14 0202 05/06/14 0630  WBC 2.3* 1.6*  HGB 9.8* 8.5*  HCT 30.8* 26.5*  PLT 32* 19*   BMET  Recent Labs  05/04/14 0202 05/05/14 0450 05/06/14 0500  NA 143 141 142  K 3.5 3.3* 3.6  CL 106 103 102  CO2 29 30 32  GLUCOSE 117* 94 95  BUN 13 15 17   CREATININE 1.09 1.20 1.37*  CALCIUM 8.5 8.4 8.6   Cardiac Panel (last 3 results) No results for input(s): CKTOTAL, CKMB, TROPONINI, RELINDX in the last 72 hours.  Studies/Results:  2D echo 05/04/14 Study Conclusions  - Left ventricle: The cavity size was normal. Wall thickness was increased in a pattern of moderate LVH. There was focal basal hypertrophy. Systolic function was moderately reduced. The estimated ejection fraction was in the range  of 35% to 40%. Diffuse hypokinesis. - Mitral valve: There was mild regurgitation. - Left atrium: The atrium was severely dilated. - Pulmonary arteries: Systolic pressure was moderately increased. PA peak pressure: 55 mm Hg (S).  Filed Weights   05/04/14 1117 05/05/14 0509 05/06/14 0506  Weight: 178 lb 12.8 oz (81.103 kg) 171 lb 8 oz (77.792 kg) 168 lb 8 oz (76.431 kg)    Assessment/Plan  Principal Problem:   Acute Systolic CHF (congestive heart failure) Active Problems:   Crohn's disease   Hypertension   BPH (benign prostatic hyperplasia)   AML (acute myeloblastic leukemia)   Pancytopenia   Atrial fibrillation   1. Acute Systolic CHF: breathing and edema both significantly improved. He has had good diuresis and appears euvolemic. He has diuresed 8.8 L since admission. Scr bumped overnight from 1.20-->1.37. Recommend holding IV lasix today and reassess in the am. Can resume lose dose PO lasix if renal function is improved. Continue to monitor fluid status closely with daily weights. Continue low sodium diet.   2. Cardiomyopathy: EF 35-40%. 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 This is currently on hold. Cardizem also discontinued. 6.25 mg of Coreg BID started yesterday. He is tolerating well with stable BP and HR. Will hold off on starting an ACE given bump in SCr from 1.20-->1.34 overnight. Continue hydralazie + nitrate for afterload reduction. Although cardiomyopathy is suspected to be potentially drug induced, we will still plan on arranging an OP NST to rule out ischemic etiologies (no h/o of anginal like symptoms).    3. Hypokalemia: Resolved with supplementation. Stable today at 3.6. Continue to follow electrolytes closely while treating with diuretics.  4. Question of AFIB - I agree with Dr. Tamala Julian, especially after reviewing tele. Looks like NSR with PVC/PAC's. NOT AFIB. No need for anticoagulation.   5. Near  Syncope: patient reports several brief episodes last night while ambulating around room. Also occurred after bending over to pick object off the floor. No frank syncope. No palpitations or CP. He was mildly dyspneic. Telemetry shows occasional PVCs and 2 runs of 3 beat NSVT. Recommend continuation of BB for ectopy. Given his degree of diuresis + multiple antihypertensives (coreg, hydralazine, Imdur and lasix), orthostatic hypotension may be a possible cause, especially since symptoms occur while standing and/or changing positions. Recommend checking orthostatic vital signs.      LOS: 2 days    Brittainy M. Rosita Fire, PA-C 05/06/2014 8:16 AM   Personally seen and examined. Agree with above. Holding lasix. Overdiuresis could lead to near syncope.  Candee Furbish, MD

## 2014-05-06 NOTE — Evaluation (Signed)
Physical Therapy Evaluation Patient Details Name: [name] MRN: [id number] DOB: 12/04/1940 Today's Date: 05/06/2014   History of Present Illness  73 yo male with recent history of chemotherapy for his leukemia, now presents with light headed symptoms and new cardiac issues, EF 35-40% and pancytopenia.  PMHx:  Chron's, neuropathy, HTN, L5-S1 fusion   Clinical Impression  Pt was seen for evaluation of current physical function and is planning to go home if able.  His immediate issue is to be weak for transitioning to [facility] from RW here, which was PLOF.  Pt is unsafe to walk longer trips alone and will need some onsite assistance.  His prev care included HHPT so will await his discharge from hosp to decide if HHPT is adequate, as he may change in function before discharge.    Follow Up Recommendations Supervision/Assistance - 24 hour;Home health PT    Equipment Recommendations  Other (comment) (shower bench per pt symptoms and pt request)    Recommendations for Other Services       Precautions / Restrictions Precautions Precautions: Fall Restrictions Weight Bearing Restrictions: No      Mobility  Bed Mobility Overal bed mobility: Modified Independent                Transfers Overall transfer level: Modified independent Equipment used: Rolling walker (2 wheeled)             General transfer comment: good observation of safety with transfers  Ambulation/Gait Ambulation/Gait assistance: Min guard Ambulation Distance (Feet): 150 Feet Assistive device: Rolling walker (2 wheeled);1 person hand held assist Gait Pattern/deviations: Step-through pattern;Wide base of support;Drifts right/left;Decreased dorsiflexion - right;Decreased dorsiflexion - left;Decreased stride length Gait velocity: slow Gait velocity interpretation: Below normal speed for age/gender    Stairs            Wheelchair Mobility    Modified Rankin (Stroke Patients Only)       Balance  Overall balance assessment: Needs assistance       Postural control: Posterior lean Standing balance support: Bilateral upper extremity supported Standing balance-Leahy Scale: Fair Standing balance comment: dynamic balance is poor                             Pertinent Vitals/Pain Pain Assessment: No/denies pain    Home Living Family/patient expects to be discharged to:: Private residence Living Arrangements: Spouse/significant other Available Help at Discharge: Family Type of Home: House Home Access: Stairs to enter Entrance Stairs-Rails: None (using post on deck) Entrance Stairs-Number of Steps:  (4 total to enter) Home Layout: Two level;Other (Comment) (basement not needed) Home Equipment: Walker - 2 wheels;Wheelchair - manual;Cane - single point Technical brewer) Additional Comments: would like to get a shower bench    Prior Function Level of Independence: Independent with assistive device(s)               Hand Dominance        Extremity/Trunk Assessment   Upper Extremity Assessment: Overall WFL for tasks assessed           Lower Extremity Assessment: Generalized weakness      Cervical / Trunk Assessment: Normal  Communication   Communication: No difficulties  Cognition Arousal/Alertness: Awake/alert Behavior During Therapy: WFL for tasks assessed/performed Overall Cognitive Status: Within Functional Limits for tasks assessed                      General Comments General  comments (skin integrity, edema, etc.): Pt is demonstratign some care with all mobility that bodes well for safety at home.  Has asked for bench for shower and is careful with his balance and mobility     Exercises General Exercises - Lower Extremity Ankle Circles/Pumps: AROM;Both;10 reps Quad Sets: AROM;Both;5 reps Gluteal Sets: AROM;Both;5 reps Long Arc Quad: AROM;Both;5 reps Heel Slides: AROM;Both;5 reps      Assessment/Plan    PT Assessment Patient  needs continued PT services  PT Diagnosis Generalized weakness   PT Problem List Decreased strength;Decreased range of motion;Decreased activity tolerance;Decreased mobility;Decreased balance;Decreased coordination;Decreased knowledge of use of DME;Cardiopulmonary status limiting activity;Impaired sensation  PT Treatment Interventions DME instruction;Gait training;Stair training;Functional mobility training;Therapeutic activities;Therapeutic exercise;Balance training;Neuromuscular re-education;Cognitive remediation;Patient/family education   PT Goals (Current goals can be found in the Care Plan section) Acute Rehab PT Goals Patient Stated Goal: to get home to wife PT Goal Formulation: With patient Time For Goal Achievement: 05/20/14 Potential to Achieve Goals: Good    Frequency Min 3X/week   Barriers to discharge Decreased caregiver support (Wife is a [place] patient)      Co-evaluation               End of Session Equipment Utilized During Treatment: Other (comment) (FWW) Activity Tolerance: Patient tolerated treatment well;Patient limited by fatigue Patient left: in bed;with call bell/phone within reach Nurse Communication: Mobility status         Time: 1251-1318 PT Time Calculation (min) (ACUTE ONLY): 27 min   Charges:   PT Evaluation $Initial PT Evaluation Tier I: 1 Procedure PT Treatments $Gait Training: 8-22 mins   PT G CodesRamond Dial 07-May-2014, 1:29 PM   Mee Hives, PT MS Acute Rehab Dept. Number: [phone]

## 2014-05-06 NOTE — Plan of Care (Signed)
Problem: Acute Rehab PT Goals(only PT should resolve) Goal: Pt Will Ambulate Level surfaces     

## 2014-05-07 ENCOUNTER — Other Ambulatory Visit: Payer: Medicare Other

## 2014-05-07 LAB — BASIC METABOLIC PANEL
Anion gap: 4 — ABNORMAL LOW (ref 5–15)
BUN: 17 mg/dL (ref 6–23)
CHLORIDE: 105 meq/L (ref 96–112)
CO2: 32 mmol/L (ref 19–32)
Calcium: 8.3 mg/dL — ABNORMAL LOW (ref 8.4–10.5)
Creatinine, Ser: 1.3 mg/dL (ref 0.50–1.35)
GFR calc Af Amer: 61 mL/min — ABNORMAL LOW (ref >90)
GFR calc non Af Amer: 52 mL/min — ABNORMAL LOW (ref >90)
GLUCOSE: 90 mg/dL (ref 70–99)
POTASSIUM: 3.5 mmol/L (ref 3.5–5.1)
SODIUM: 141 mmol/L (ref 135–145)

## 2014-05-07 LAB — MAGNESIUM: MAGNESIUM: 1.8 mg/dL (ref 1.5–2.5)

## 2014-05-07 MED ORDER — CARVEDILOL 6.25 MG PO TABS: 6.2500 mg | ORAL_TABLET | Freq: Two times a day (BID) | ORAL | Status: DC

## 2014-05-07 MED ORDER — ISOSORBIDE MONONITRATE ER 30 MG PO TB24: 30.0000 mg | ORAL_TABLET | Freq: Every day | ORAL | Status: DC

## 2014-05-07 MED ORDER — HYDRALAZINE HCL 25 MG PO TABS: 25.0000 mg | ORAL_TABLET | Freq: Three times a day (TID) | ORAL | Status: DC

## 2014-05-07 MED ORDER — HEPARIN SOD (PORK) LOCK FLUSH 100 UNIT/ML IV SOLN
250.0000 [IU] | INTRAVENOUS | Status: AC | PRN
  Administered 2014-05-07: 250 [IU]
  Filled 2014-05-07: qty 5

## 2014-05-07 MED ORDER — HYDRALAZINE HCL 25 MG PO TABS
25.0000 mg | ORAL_TABLET | Freq: Three times a day (TID) | ORAL | Status: DC
  Administered 2014-05-07: 25 mg via ORAL
  Filled 2014-05-07: qty 1

## 2014-05-07 MED ORDER — HEPARIN SOD (PORK) LOCK FLUSH 10 UNIT/ML IV SOLN
10.0000 [IU] | Freq: Once | INTRAVENOUS | Status: DC
  Filled 2014-05-07: qty 1

## 2014-05-07 NOTE — Discharge Summary (Signed)
[name], is a 73 y.o. male  DOB 12/02/1940  MRN [id number].  Admission date:  05/04/2014  Admitting Physician  Thurnell Lose, MD  Discharge Date:  05/07/2014   Primary MD  [name], MD  Recommendations for primary care physician for things to follow:   Monitor weight, BMP closely. Must follow with cardiology in a week.   Admission Diagnosis  Acute congestive heart failure, unspecified congestive heart failure type [I50.9]   Discharge Diagnosis  Acute congestive heart failure, unspecified congestive heart failure type [I50.9]    Principal Problem:   Acute diastolic CHF (congestive heart failure) Active Problems:   Crohn's disease   Hypertension   BPH (benign prostatic hyperplasia)   AML (acute myeloblastic leukemia)   Pancytopenia   Atrial fibrillation   PVC (premature ventricular contraction)      Past Medical History  Diagnosis Date  . Crohn's disease   . Iron deficiency anemia   . Leukopenia   . Hypertension   . BPH (benign prostatic hyperplasia)   . 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 Kidney stones   . AML (acute myeloblastic leukemia)   . Shortness of breath dyspnea     Past Surgical History  Procedure Laterality Date  . Lumbar fusion      L5-S1  . Kidney stone surgery    . Cataract extraction Right   . Cardiac catheterization  2001       History of present illness and  Hospital Course:     Kindly see H&P for history of present illness and admission details, please review complete Labs, Consult reports and Test reports for all details in brief  HPI  from the history and physical done on the day of admission   [name] is a 73 y.o. male with a history of AML on Chemotherapy through [facility] and his last chemotherapy rx was 12/19 who  presents to the ED with complaints of worsening SOB over the past month, and worse tonight. He also reports having increased edema of both of his legs and orthopnea. He denies any fevers or chills. He was found in the ED to have A. Fib with RVR and was administered IV Cardizem x1 and his rate improved . A Chest X-ray revealed Pulmonary Edema and his BNP was 2124.1. He was administered 40 mg of IV Lasix X 1 and began to diurese and improve. He was referred for medical admission.    Hospital Course    1. Acute respiratory failure due to acute on chronic combined systolic and diastolic CHF. EF 35% with diffuse hypokinesis. He is improved switched to PO Lasix, he is diuresed around 8 L, on Imdur, hydralazine, allergic to beta blocker. 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 Discussed with Dr. Marlou Porch. No ACE inhibitor or Lasix upon discharge per cardiology. He will follow  with patient closely and place on these medications if needed.   Filed Weights   05/05/14 0509 05/06/14 0506 05/07/14 0600  Weight: 77.792 kg (171 lb 8 oz) 76.431 kg (168 lb 8 oz) 77.565 kg (171 lb)      2. AML with pancytopenia. Followed at [facility] undergoing chemotherapy. Has moderate neutropenia monitor. Follow CBC in the outpatient setting post discharge.    3. BPH. Continue finasteride.   4. ? New onset atrial fibrillation on EKG. Per cardiology  no A. fib.    5. Hypokalemia. Replaced.     Discharge Condition: Stable   Follow UP  Follow-up Information    Follow up with [facility].   Why:  Registered Nurse and Physical Therapy   Contact information:   [place] [address] [phone]       Follow up with [name], MD. Schedule an appointment as soon as possible for  a visit in 4 days.   Specialty:  Family Medicine   Contact information:   [address], [place] [address] [phone]       Follow up with Candee Furbish, MD. Schedule an appointment as soon as possible for a visit in 1 week.   Specialty:  Cardiology   Contact information:   [address] [address] [phone]         Discharge Instructions  and  Discharge Medications      Discharge Instructions    Discharge instructions    Complete by:  As directed   Follow with Primary MD [name], MD in 4 days   Get CBC, CMP, 2 view Chest X ray checked  by Primary MD next visit.    Activity: As tolerated with Full fall precautions use walker/cane & assistance as needed   Disposition Home     Diet: Heart Healthy  Check your Weight same time everyday, if you gain over 2 pounds, or you develop in leg swelling, experience more shortness of breath or chest pain, call your Primary MD immediately. Follow Cardiac Low Salt Diet and 1.8 lit/day fluid restriction.   On your next visit with your primary care physician please Get Medicines reviewed and adjusted.   Please request your Prim.MD to go over all Hospital Tests and Procedure/Radiological results at the follow up, please get all Hospital records sent to your Prim MD by signing hospital release before you go home.   If you experience worsening of your admission symptoms, develop shortness of breath, life threatening emergency, suicidal or homicidal thoughts you must seek medical attention immediately by calling 911 or calling your MD immediately  if symptoms less severe.  You Must read complete instructions/literature along with all the possible adverse reactions/side effects for all the Medicines you take and that have been prescribed to you. Take any new Medicines after you have completely understood and accpet all the possible adverse reactions/side effects.   Do not drive,  operating heavy machinery, perform activities at heights, swimming or participation in water activities or provide baby sitting services if your were admitted for syncope or siezures until you have seen by Primary MD or a Neurologist and advised to do so again.  Do not drive when taking Pain medications.    Do not take more than prescribed Pain, Sleep and Anxiety Medications  Special Instructions: If you have smoked or chewed Tobacco  in the last 2 yrs please stop smoking, stop any  regular Alcohol  and or any Recreational drug use.  Wear Seat belts while driving.   Please note  You were cared for by a hospitalist during your hospital stay. If you have any questions about your discharge medications or the care you received while you were in the hospital after you are discharged, you can call the unit and asked to speak with the hospitalist on call if the hospitalist that took care of you is not available. Once you are discharged, your primary care physician will handle any further medical issues. Please note that NO REFILLS for any discharge medications will be authorized once you are discharged, as it is imperative that you return to your primary care physician (or establish a relationship with a primary care physician if you do not have one) for your aftercare needs so that they can reassess your need for medications and monitor your lab values.     Increase activity slowly    Complete by:  As directed             Medication List    STOP taking these medications        levofloxacin 500 MG tablet  Commonly known as:  LEVAQUIN     metroNIDAZOLE 500 MG tablet  Commonly known as:  FLAGYL      TAKE these medications        carvedilol 6.25 MG tablet  Commonly known as:  COREG  Take 1 tablet (6.25 mg total) by mouth 2 (two) times daily with a meal.     finasteride 5 MG tablet  Commonly known as:  PROSCAR  Take 5 mg by mouth at bedtime.     fluconazole 200 MG tablet  Commonly  known as:  DIFLUCAN  Take 200 mg by mouth at bedtime.     fluticasone 50 MCG/ACT nasal spray  Commonly known as:  FLONASE  Place 1 spray into both nostrils daily.     hydrALAZINE 25 MG tablet  Commonly known as:  APRESOLINE  Take 1 tablet (25 mg total) by mouth every 8 (eight) hours.     isosorbide mononitrate 30 MG 24 hr tablet  Commonly known as:  IMDUR  Take 1 tablet (30 mg total) by mouth daily.     loratadine 10 MG tablet  Commonly known as:  CLARITIN  Take 10 mg by mouth daily as needed for allergies.     mirtazapine 15 MG tablet  Commonly known as:  REMERON  Take 15 mg by mouth at bedtime.     omeprazole 20 MG capsule  Commonly known as:  PRILOSEC  Take 20 mg by mouth daily before breakfast.     ondansetron 4 MG tablet  Commonly known as:  ZOFRAN  Take 1 tablet (4 mg total) by mouth every 8 (eight) hours as needed for nausea or vomiting.     oxyCODONE 5 MG immediate release tablet  Commonly known as:  Oxy IR/ROXICODONE  Take 5 mg by mouth every 4 (four) hours as needed for moderate pain or severe pain.     oxyCODONE-acetaminophen 5-325 MG per tablet  Commonly known as:  PERCOCET/ROXICET  Take 1-2 tablets by mouth every 3 (three) hours as needed for moderate pain.     polyethylene glycol packet  Commonly known as:  MIRALAX / GLYCOLAX  Take 17 g by mouth daily as needed for mild constipation.     potassium chloride 10 MEQ CR capsule  Commonly known as:  MICRO-K  Take 10 mEq  by mouth daily.     predniSONE 20 MG tablet  Commonly known as:  DELTASONE  Take 1 tablet (20 mg total) by mouth daily with breakfast.     prochlorperazine 5 MG tablet  Commonly known as:  COMPAZINE  Take 5 mg by mouth every 6 (six) hours as needed for nausea or vomiting.     sennosides-docusate sodium 8.6-50 MG tablet  Commonly known as:  SENOKOT-S  Take 1-2 tablets by mouth daily as needed for constipation. 1-2 tablets 2 times daily by mouth.          Diet and Activity  recommendation: See Discharge Instructions above   Consults obtained - Cards   Major procedures and Radiology Reports - PLEASE review detailed and final reports for all details, in brief -     TTE  - Left ventricle: The cavity size was normal. Wall thickness wasincreased in a pattern of moderate LVH. There was focal basalhypertrophy. Systolic function was moderately reduced. Theestimated ejection fraction was in the range of 35% to 40%.Diffuse hypokinesis. - Mitral valve: There was mild regurgitation. - Left atrium: The atrium was severely dilated. - Pulmonary arteries: Systolic pressure was moderately increased.PA peak pressure: 55 mm Hg (S).  Dg Chest Port 1 View  05/04/2014   CLINICAL DATA:  Tachycardia  EXAM: PORTABLE CHEST - 1 VIEW  COMPARISON:  09/30/2013  FINDINGS: New cardiomegaly which is accentuated by low lung volumes. There is widening of the vascular pedicle.  There is a left upper extremity PICC, with tip overlapping the descending thoracic aorta. 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  There is diffuse interstitial opacity and small bilateral pleural effusions. No pneumothorax.  These results were called by telephone at the time of interpretation on 05/04/2014 at 3:14 am to Dr. [name] , who verbally acknowledged these results.  IMPRESSION: 1. 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 history of AML, note that ATRA syndrome can have this appearance. 2. Atypical positioning of left upper extremity PICC. If clinically venous, the tip is likely within a persistent left SVC (present on 04/18/2013 abdominal CT). If arterial, the tip is in the descending thoracic aorta.   Electronically Signed   By: Jorje Guild M.D.   On: 05/04/2014 03:13    Micro Results      No results found for this or any previous visit (from the past 240 hour(s)).     Today   Subjective:   [name] today has no headache,no chest abdominal  pain,no new weakness tingling or numbness, feels much better wants to go home today.   Objective:   Blood pressure 112/67, pulse 75, temperature 98.4 F (36.9 C), temperature source Oral, resp. rate 16, height 5\' 9"  (1.753 m), weight 77.565 kg (171 lb), SpO2 100 %.   Intake/Output Summary (Last 24 hours) at 05/07/14 1344 Last data filed at 05/07/14 1300  Gross per 24 hour  Intake    980 ml  Output    975 ml  Net      5 ml    Exam Awake Alert, Oriented x 3, No new F.N deficits, Normal affect [place].AT,PERRAL Supple Neck,No JVD, No cervical lymphadenopathy appriciated.  Symmetrical Chest wall movement, Good air movement bilaterally, CTAB RRR,No Gallops,Rubs or new Murmurs, No Parasternal Heave +ve B.Sounds, Abd Soft, Non tender, No organomegaly appriciated, No rebound -guarding or rigidity. No Cyanosis, Clubbing or edema, No new Rash or bruise  Data Review   CBC w Diff: Lab  Results  Component Value Date   WBC 1.6* 05/06/2014   WBC 3.0* 04/29/2014   HGB 8.5* 05/06/2014   HGB 9.9* 04/29/2014   HCT 26.5* 05/06/2014   HCT 30.6* 04/29/2014   PLT 19* 05/06/2014   PLT 84* 04/29/2014   LYMPHOPCT 48* 05/06/2014   LYMPHOPCT 26.3 04/29/2014   MONOPCT 3 05/06/2014   MONOPCT 4.0 04/29/2014   EOSPCT 1 05/06/2014   EOSPCT 0.3 04/29/2014   BASOPCT 0 05/06/2014   BASOPCT 0.0 04/29/2014    CMP: Lab Results  Component Value Date   NA 141 05/07/2014   NA 143 04/29/2014   K 3.5 05/07/2014   K 3.4* 04/29/2014   CL 105 05/07/2014   CO2 32 05/07/2014   CO2 27 04/29/2014   BUN 17 05/07/2014   BUN 13.0 04/29/2014   CREATININE 1.30 05/07/2014   CREATININE 1.0 04/29/2014   PROT 5.6* 04/29/2014   PROT 6.4 09/30/2013   ALBUMIN 2.6* 04/29/2014   ALBUMIN 2.5* 09/30/2013   BILITOT 0.60 04/29/2014   BILITOT 1.4* 09/30/2013   ALKPHOS 72 04/29/2014   ALKPHOS 121* 09/30/2013   AST 10 04/29/2014   AST 29 09/30/2013   ALT 7 04/29/2014   ALT 42 09/30/2013  .   Total Time in preparing  paper work, data evaluation and todays exam - 35 minutes  Thurnell Lose M.D on 05/07/2014 at 1:44 PM  [place]  [phone]

## 2014-05-07 NOTE — Progress Notes (Signed)
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    BNP: 3304 EF: 35-40% on 2D echo  Subjective: Patient had several brief episodes of near syncope last night lasting only 2-3 seconds. No associated palpitations or chest pain. He was mildly dyspneic during episodes. He denies frank syncope. Now w/o symptoms. Breathing has improved  Objective: Vital signs in last 24 hours: Temp:  [97.9 F (36.6 C)-98.4 F (36.9 C)] 98.4 F (36.9 C) (12/31 0600) Pulse Rate:  [67-75] 75 (12/31 0600) Resp:  [16-18] 16 (12/31 0600) BP: (107-132)/(57-82) 112/67 mmHg (12/31 0600) SpO2:  [100 %] 100 % (12/31 0600) Weight:  [171 lb (77.565 kg)] 171 lb (77.565 kg) (12/31 0600) Last BM Date: 05/05/14  Intake/Output from previous day: 12/30 0701 - 12/31 0700 In: 1340 [P.O.:1340] Out: 875 [Urine:875] Intake/Output this shift:    Medications Current Facility-Administered Medications  Medication Dose Route Frequency Provider Last Rate Last Dose  . 0.9 %  sodium chloride infusion  250 mL Intravenous PRN Theressa Millard, MD      . acetaminophen (TYLENOL) tablet 650 mg  650 mg Oral Q4H PRN Theressa Millard, MD      . carvedilol (COREG) tablet 6.25 mg  6.25 mg Oral BID WC Candee Furbish, MD   6.25 mg at 05/07/14 0826  . finasteride (PROSCAR) tablet 5 mg  5 mg Oral QHS Theressa Millard, MD   5 mg at 05/06/14 2124  . fluconazole (DIFLUCAN) tablet 200 mg  200 mg Oral QHS Theressa Millard, MD   200 mg at 05/06/14 2124  . furosemide (LASIX) tablet 40 mg  40 mg Oral BID Thurnell Lose, MD   40 mg at 05/07/14 4782  . hydrALAZINE (APRESOLINE) tablet 25 mg  25 mg Oral 3 times per day Thurnell Lose, MD      . isosorbide mononitrate (IMDUR) 24 hr tablet 30 mg  30 mg Oral Daily Thurnell Lose, MD   30 mg at 05/07/14 9562  .  loratadine (CLARITIN) tablet 10 mg  10 mg Oral Daily PRN Theressa Millard, MD      . mirtazapine (REMERON) tablet 15 mg  15 mg Oral QHS Theressa Millard, MD   15 mg at 05/06/14 2124  . ondansetron (ZOFRAN) injection 4 mg  4 mg Intravenous Q6H PRN Theressa Millard, MD      . pantoprazole (PROTONIX) EC tablet 40 mg  40 mg Oral Daily Theressa Millard, MD   40 mg at 05/07/14 1308  . polyethylene glycol (MIRALAX / GLYCOLAX) packet 17 g  17 g Oral Daily PRN Theressa Millard, MD      . senna-docusate (Senokot-S) tablet 1-2 tablet  1-2 tablet Oral Daily PRN Theressa Millard, MD      . sodium chloride 0.9 % injection 10-40 mL  10-40 mL Intracatheter PRN Thurnell Lose, MD   12 mL at 05/05/14 1025  . sodium chloride 0.9 % injection 3 mL  3 mL Intravenous Q12H Theressa Millard, MD   3 mL at 05/06/14 2126  . sodium chloride 0.9 % injection 3 mL  3 mL Intravenous PRN Theressa Millard, MD        PE: General appearance: alert, cooperative and no distress Neck: no carotid bruit and no JVD Lungs: clear to auscultation bilaterally Heart: regular rate and rhythm, S1, S2  normal, no murmur, click, rub or gallop Extremities: no LEE Pulses: 2+ and symmetric Skin: warm and dry Neurologic: Grossly normal  Lab Results:   Recent Labs  05/06/14 0630  WBC 1.6*  HGB 8.5*  HCT 26.5*  PLT 19*   BMET  Recent Labs  05/05/14 0450 05/06/14 0500 05/07/14 0426  NA 141 142 141  K 3.3* 3.6 3.5  CL 103 102 105  CO2 30 32 32  GLUCOSE 94 95 90  BUN 15 17 17   CREATININE 1.20 1.37* 1.30  CALCIUM 8.4 8.6 8.3*   Cardiac Panel (last 3 results) No results for input(s): CKTOTAL, CKMB, TROPONINI, RELINDX in the last 72 hours.  Studies/Results:  2D echo 05/04/14 Study Conclusions  - Left ventricle: The cavity size was normal. Wall thickness was increased in a pattern of moderate LVH. There was focal basal hypertrophy. Systolic function was moderately reduced. The estimated ejection  fraction was in the range of 35% to 40%. Diffuse hypokinesis. - Mitral valve: There was mild regurgitation. - Left atrium: The atrium was severely dilated. - Pulmonary arteries: Systolic pressure was moderately increased. PA peak pressure: 55 mm Hg (S).  Filed Weights   05/05/14 0509 05/06/14 0506 05/07/14 0600  Weight: 171 lb 8 oz (77.792 kg) 168 lb 8 oz (76.431 kg) 171 lb (77.565 kg)    Assessment/Plan  Principal Problem:   Acute Systolic CHF (congestive heart failure) Active Problems:   Crohn's disease   Hypertension   BPH (benign prostatic hyperplasia)   AML (acute myeloblastic leukemia)   Pancytopenia   Atrial fibrillation   1. Acute Systolic CHF: breathing and edema both significantly improved. He has had good diuresis and appears euvolemic. He has diuresed 8.8 L since admission. Scr bumped overnight from 1.20-->1.37. Recommend holding IV lasix today and reassess in the am. Can resume lose dose PO lasix if renal function is improved. Continue to monitor fluid status closely with daily weights. Continue low sodium diet.   2. Cardiomyopathy: EF 35-40%. 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 This is currently on hold. Cardizem also discontinued. 6.25 mg of Coreg BID started yesterday. He is tolerating well with stable BP and HR. Will hold off on starting an ACE given bump in SCr from 1.20-->1.34-->1.3 overnight. Continue hydralazie + nitrate for afterload reduction. Although cardiomyopathy is suspected to be potentially drug induced, we will still plan on arranging an OP NST to rule out ischemic etiologies (no h/o of anginal like symptoms).    3. Hypokalemia: Resolved with supplementation. Stable Continue to follow electrolytes closely while treating with diuretics.  4. Question of AFIB - I agree with Dr. Tamala Julian, especially after reviewing tele. Looks like NSR with PVC/PAC's. NOT AFIB. No need for anticoagulation.    5. Near Syncope: patient reports several brief episodes last night while ambulating around room. Also occurred after bending over to pick object off the floor. No frank syncope. No palpitations or CP. He was mildly dyspneic. Telemetry shows occasional PVCs and 2 runs of 3 beat NSVT. Recommend continuation of BB for ectopy. Given his degree of diuresis + multiple antihypertensives (coreg, hydralazine, Imdur and lasix), orthostatic hypotension may be a possible cause, especially since symptoms occur while standing and/or changing positions.   Hold lasix. May need restart in clinic.   OK with DC  Will see back in clinic in 7 days.  Will check BMET then.  Fluttering feeling/ SOB last night - no adverse rhythms on tele. PVC's only.  LOS: 3 days    Candee Furbish, MD

## 2014-05-07 NOTE — Discharge Instructions (Signed)
Follow with Primary MD [name], MD in 4 days   Get CBC, CMP, 2 view Chest X ray checked  by Primary MD next visit.    Activity: As tolerated with Full fall precautions use walker/cane & assistance as needed   Disposition Home     Diet: Heart Healthy  Check your Weight same time everyday, if you gain over 2 pounds, or you develop in leg swelling, experience more shortness of breath or chest pain, call your Primary MD immediately. Follow Cardiac Low Salt Diet and 1.8 lit/day fluid restriction.   On your next visit with your primary care physician please Get Medicines reviewed and adjusted.   Please request your Prim.MD to go over all Hospital Tests and Procedure/Radiological results at the follow up, please get all Hospital records sent to your Prim MD by signing hospital release before you go home.   If you experience worsening of your admission symptoms, develop shortness of breath, life threatening emergency, suicidal or homicidal thoughts you must seek medical attention immediately by calling 911 or calling your MD immediately  if symptoms less severe.  You Must read complete instructions/literature along with all the possible adverse reactions/side effects for all the Medicines you take and that have been prescribed to you. Take any new Medicines after you have completely understood and accpet all the possible adverse reactions/side effects.   Do not drive, operating heavy machinery, perform activities at heights, swimming or participation in water activities or provide baby sitting services if your were admitted for syncope or siezures until you have seen by Primary MD or a Neurologist and advised to do so again.  Do not drive when taking Pain medications.    Do not take more than prescribed Pain, Sleep and Anxiety Medications  Special Instructions: If you have smoked or chewed Tobacco  in the last 2 yrs please stop smoking, stop any regular Alcohol  and or any  Recreational drug use.  Wear Seat belts while driving.   Please note  You were cared for by a hospitalist during your hospital stay. If you have any questions about your discharge medications or the care you received while you were in the hospital after you are discharged, you can call the unit and asked to speak with the hospitalist on call if the hospitalist that took care of you is not available. Once you are discharged, your primary care physician will handle any further medical issues. Please note that NO REFILLS for any discharge medications will be authorized once you are discharged, as it is imperative that you return to your primary care physician (or establish a relationship with a primary care physician if you do not have one) for your aftercare needs so that they can reassess your need for medications and monitor your lab values.

## 2014-05-07 NOTE — Progress Notes (Signed)
Physical Therapy Treatment Patient Details Name: [name] MRN: [id number] DOB: 21-Nov-1940 Today's Date: 05/07/2014    History of Present Illness 73 yo male with recent history of chemotherapy for his leukemia, now presents with light headed symptoms and new cardiac issues, EF 35-40% and pancytopenia.  PMHx:  Chron's, neuropathy, HTN, L5-S1 fusion     PT Comments    PT performed home oxygen qualification with pt during today's session. Pt found resting on RA in recliner and had SpO2 97-98%. Pt's SpO2 dropped to 92% upon initial standing from recliner and rebounded to 98% within seconds of standing. Pt ambulated safely with RW in hallway and maintained SpO2 of 98% throughout ambulation without complaint of SOB. Pt did complain of some lightheadedness upon initial standing which he attributed to some of the chemo medications he is taking. PT took BP in standing (131/66) and sitting EOB (120/85) after ambulation and pt stated he was no longer feeling lightheaded.    Follow Up Recommendations  Supervision/Assistance - 24 hour;Home health PT     Equipment Recommendations  Other (comment) (shower bench per pt symptoms and request)    Recommendations for Other Services       Precautions / Restrictions Precautions Precautions: Fall Restrictions Weight Bearing Restrictions: No    Mobility  Bed Mobility Overal bed mobility: Modified Independent             General bed mobility comments: Use used bed rails to adjust self in bed but required no assistance or cuing.   Transfers Overall transfer level: Modified independent Equipment used: Rolling walker (2 wheeled)             General transfer comment: Pt used arm rest to push off for transfer to stand. Pt did not need assistance or cuing and had no loss of balance.   Ambulation/Gait Ambulation/Gait assistance: Supervision Ambulation Distance (Feet): 180 Feet Assistive device: Rolling walker (2 wheeled) Gait  Pattern/deviations: Step-through pattern;Decreased stride length   Gait velocity interpretation: Below normal speed for age/gender General Gait Details: Pt showing good safety awareness during ambulation and is able to recognize he needs to take him time when moving around on his feet. Pt had no loss of balance and did well using RW.    Stairs            Wheelchair Mobility    Modified Rankin (Stroke Patients Only)       Balance Overall balance assessment: Needs assistance         Standing balance support: During functional activity;No upper extremity supported Standing balance-Leahy Scale: Fair Standing balance comment: Pt able to statically maintain balance without support but benefits from RW for dynamic mobility.                     Cognition Arousal/Alertness: Awake/alert Behavior During Therapy: WFL for tasks assessed/performed Overall Cognitive Status: Within Functional Limits for tasks assessed                      Exercises      General Comments        Pertinent Vitals/Pain Pain Assessment: No/denies pain  SpO2 at rest in recliner on RA: 97-98% SpO2 throughout ambulation on RA: 98% BP standing after ambulation: 131/66 BP sitting EOB after ambulation: 120/85    Home Living                      Prior Function  PT Goals (current goals can now be found in the care plan section) Acute Rehab PT Goals PT Goal Formulation: With patient Time For Goal Achievement: 05/20/14 Potential to Achieve Goals: Good Progress towards PT goals: Progressing toward goals    Frequency  Min 3X/week    PT Plan Current plan remains appropriate    Co-evaluation             End of Session Equipment Utilized During Treatment: Gait belt Activity Tolerance: Patient tolerated treatment well Patient left: in bed;with call bell/phone within reach     Time: 0847-0911 PT Time Calculation (min) (ACUTE ONLY): 24 min  Charges:   $Gait Training: 8-22 mins $Therapeutic Activity: 8-22 mins                    G CodesJearld Shines SPT 05/07/2014, 9:59 AM  Jearld Shines, SPT  Acute Rehabilitation [phone]

## 2014-05-07 NOTE — Progress Notes (Signed)
Patient Demographics  [name], is a 73 y.o. male, DOB - 09/05/1940, IWP:[id number]  Admit date - 05/04/2014   Admitting Physician Thurnell Lose, MD  Outpatient Primary MD for the patient is [name], MD  LOS - 3   Chief Complaint  Patient presents with  . Tachycardia        Subjective:   [name] today has, No headache, No chest pain, No abdominal pain - No Nausea, No new weakness tingling or numbness, No Cough - Improved SOB  Assessment & Plan    1. Acute respiratory failure due to acute on chronic combined systolic and diastolic CHF. EF 35% with diffuse hypokinesis. He is improved switched to PO Lasix, he is diuresed around 8 L, on Imdur, hydralazine, allergic to beta blocker , on ACE inhibitor if renal function stays stable. Cardiology following. 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 Discussed with Dr. Marlou Porch.   Filed Weights   05/05/14 0509 05/06/14 0506 05/07/14 0600  Weight: 77.792 kg (171 lb 8 oz) 76.431 kg (168 lb 8 oz) 77.565 kg (171 lb)      2. AML with pancytopenia. Followed at [facility] undergoing chemotherapy. Has moderate neutropenia monitor.    3. BPH. Continue finasteride.   4. ? New onset atrial fibrillation on EKG. Per cardiology likely no A. fib.    5. Hypokalemia. Replaced.     Code Status: Full  Family Communication: Wife  Disposition Plan: Home likely Friday   Procedures   TTE  - Left ventricle: The cavity size was normal. Wall thickness wasincreased in a pattern of moderate LVH. There was focal basalhypertrophy. Systolic function was moderately reduced. Theestimated ejection fraction was in the range of 35% to  40%.Diffuse hypokinesis. - Mitral valve: There was mild regurgitation. - Left atrium: The atrium was severely dilated. - Pulmonary arteries: Systolic pressure was moderately increased.PA peak pressure: 55 mm Hg (S).   Consults  Cards   Medications  Scheduled Meds: . carvedilol  6.25 mg Oral BID WC  . finasteride  5 mg Oral QHS  . fluconazole  200 mg Oral QHS  . furosemide  40 mg Oral BID  . hydrALAZINE  50 mg Oral 3 times per day  . isosorbide mononitrate  30 mg Oral Daily  . mirtazapine  15 mg Oral QHS  . pantoprazole  40 mg Oral Daily  . sodium chloride  3 mL Intravenous Q12H   Continuous Infusions:  PRN Meds:.sodium chloride, acetaminophen, loratadine, ondansetron (ZOFRAN) IV, polyethylene glycol, senna-docusate, sodium chloride, sodium chloride  DVT Prophylaxis  SCD  Lab Results  Component Value Date   PLT 19* 05/06/2014    Antibiotics     Anti-infectives    Start     Dose/Rate Route Frequency Ordered Stop   05/04/14 2230  levofloxacin (LEVAQUIN) tablet 500 mg     500 mg Oral  Once 05/04/14 2218 05/04/14 2314   05/04/14 2200  fluconazole (DIFLUCAN) tablet 200 mg     200 mg Oral Daily at bedtime 05/04/14 0707            Objective:   Filed Vitals:   05/06/14 1728 05/06/14 1729 05/06/14 2041 05/07/14 0600  BP: 132/82  116/61 112/67  Pulse:  74 67 75  Temp:   97.9 F (36.6 C) 98.4 F (36.9 C)  TempSrc:   Oral Oral  Resp:   18 16  Height:      Weight:    77.565 kg (171 lb)  SpO2:   100% 100%    Wt Readings from Last 3 Encounters:  05/07/14 77.565 kg (171 lb)  12/25/13 80.876 kg (178 lb 4.8 oz)  09/30/13 88.4 kg (194 lb 14.2 oz)     Intake/Output Summary (Last 24 hours) at 05/07/14 0847 Last data filed at 05/07/14 0600  Gross per 24 hour  Intake   1080 ml  Output    575 ml  Net    505 ml     Physical Exam  Awake Alert, Oriented X 3, No new F.N deficits, Normal affect [place].AT,PERRAL Supple Neck,No JVD, No cervical lymphadenopathy  appriciated.  Symmetrical Chest wall movement, Good air movement bilaterally, few +ve rales RRR,No Gallops,Rubs or new Murmurs, No Parasternal Heave +ve B.Sounds, Abd Soft, No tenderness, No organomegaly appriciated, No rebound - guarding or rigidity. No Cyanosis, Clubbing, trace edema, No new Rash or bruise      Data Review   Micro Results No results found for this or any previous visit (from the past 240 hour(s)).  Radiology Reports Dg Chest Port 1 View  05/04/2014   CLINICAL DATA:  Tachycardia  EXAM: PORTABLE CHEST - 1 VIEW  COMPARISON:  09/30/2013  FINDINGS: New cardiomegaly which is accentuated by low lung volumes. There is widening of the vascular pedicle.  There is a left upper extremity PICC, with tip overlapping the descending thoracic aorta. 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  There is diffuse interstitial opacity and small bilateral pleural effusions. No pneumothorax.  These results were called by telephone at the time of interpretation on 05/04/2014 at 3:14 am to Dr. [name] , who verbally acknowledged these results.  IMPRESSION: 1. 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 history of AML, note that ATRA syndrome can have this appearance. 2. Atypical positioning of left upper extremity PICC. If clinically venous, the tip is likely within a persistent left SVC (present on 04/18/2013 abdominal CT). If arterial, the tip is in the descending thoracic aorta.   Electronically Signed   By: Jorje Guild M.D.   On: 05/04/2014 03:13     CBC  Recent Labs Lab 05/04/14 0202 05/06/14 0630  WBC 2.3* 1.6*  HGB 9.8* 8.5*  HCT 30.8* 26.5*  PLT 32* 19*  MCV 92.2 92.3  MCH 29.3 29.6  MCHC 31.8 32.1  RDW 21.2* 21.8*  LYMPHSABS  --  0.8  MONOABS  --  0.0*  EOSABS  --  0.0  BASOSABS  --  0.0    Chemistries   Recent Labs Lab 05/04/14 0202 05/05/14 0450 05/06/14 0500 05/07/14 0426  NA 143 141 142 141  K 3.5 3.3* 3.6 3.5  CL  106 103 102 105  CO2 29 30 32 32  GLUCOSE 117* 94 95 90  BUN 13 15 17 17   CREATININE 1.09 1.20 1.37* 1.30  CALCIUM 8.5 8.4 8.6 8.3*  MG  --   --  1.6 1.8   ------------------------------------------------------------------------------------------------------------------ estimated creatinine clearance is 49.9 mL/min (by C-G formula based on Cr of 1.3). ------------------------------------------------------------------------------------------------------------------ No results for input(s): HGBA1C in the last 72 hours. ------------------------------------------------------------------------------------------------------------------ No results for input(s): CHOL, HDL, LDLCALC, TRIG, CHOLHDL, LDLDIRECT in the last 72 hours. ------------------------------------------------------------------------------------------------------------------  Recent Labs  05/04/14 1255  TSH 1.404   ------------------------------------------------------------------------------------------------------------------ No results for input(s): VITAMINB12, FOLATE, FERRITIN, TIBC, IRON, RETICCTPCT in the last 72 hours.  Coagulation profile No results for input(s): INR, PROTIME in the last 168 hours.  No results for input(s): DDIMER in the last 72 hours.  Cardiac Enzymes No results for input(s): CKMB, TROPONINI, MYOGLOBIN in the last 168 hours.  Invalid input(s): CK ------------------------------------------------------------------------------------------------------------------ Invalid input(s): POCBNP     Time Spent in minutes  35   Elmar Antigua K M.D on 05/07/2014 at 8:47 AM  Between 7am to 7pm - Pager - [phone]  After 7pm go to [email or web address] - [facility] Office  [phone]

## 2014-05-11 ENCOUNTER — Other Ambulatory Visit: Payer: Self-pay | Admitting: *Deleted

## 2014-05-11 ENCOUNTER — Ambulatory Visit (HOSPITAL_BASED_OUTPATIENT_CLINIC_OR_DEPARTMENT_OTHER): Payer: Medicare Other

## 2014-05-11 ENCOUNTER — Other Ambulatory Visit (HOSPITAL_BASED_OUTPATIENT_CLINIC_OR_DEPARTMENT_OTHER): Payer: Medicare Other

## 2014-05-11 ENCOUNTER — Encounter (HOSPITAL_COMMUNITY): Payer: Medicare Other

## 2014-05-11 VITALS — BP 144/78 | HR 75 | Temp 97.6°F

## 2014-05-11 DIAGNOSIS — C9200 Acute myeloblastic leukemia, not having achieved remission: Secondary | ICD-10-CM

## 2014-05-11 DIAGNOSIS — Z452 Encounter for adjustment and management of vascular access device: Secondary | ICD-10-CM

## 2014-05-11 DIAGNOSIS — D72819 Decreased white blood cell count, unspecified: Secondary | ICD-10-CM

## 2014-05-11 LAB — CBC WITH DIFFERENTIAL/PLATELET
BASO%: 0 % (ref 0.0–2.0)
Basophils Absolute: 0 10*3/uL (ref 0.0–0.1)
EOS%: 2.9 % (ref 0.0–7.0)
Eosinophils Absolute: 0 10*3/uL (ref 0.0–0.5)
HCT: 27.5 % — ABNORMAL LOW (ref 38.4–49.9)
HGB: 9.1 g/dL — ABNORMAL LOW (ref 13.0–17.1)
LYMPH%: 68.6 % — ABNORMAL HIGH (ref 14.0–49.0)
MCH: 31.5 pg (ref 27.2–33.4)
MCHC: 33.1 g/dL (ref 32.0–36.0)
MCV: 95.2 fL (ref 79.3–98.0)
MONO#: 0 10*3/uL — ABNORMAL LOW (ref 0.1–0.9)
MONO%: 2 % (ref 0.0–14.0)
NEUT#: 0.3 10*3/uL — CL (ref 1.5–6.5)
NEUT%: 26.5 % — ABNORMAL LOW (ref 39.0–75.0)
Platelets: 24 10*3/uL — ABNORMAL LOW (ref 140–400)
RBC: 2.89 10*6/uL — ABNORMAL LOW (ref 4.20–5.82)
RDW: 23.3 % — ABNORMAL HIGH (ref 11.0–14.6)
WBC: 1 10*3/uL — ABNORMAL LOW (ref 4.0–10.3)
lymph#: 0.7 10*3/uL — ABNORMAL LOW (ref 0.9–3.3)

## 2014-05-11 LAB — COMPREHENSIVE METABOLIC PANEL (CC13)
ANION GAP: 8 meq/L (ref 3–11)
AST: 9 U/L (ref 5–34)
Albumin: 2.9 g/dL — ABNORMAL LOW (ref 3.5–5.0)
Alkaline Phosphatase: 68 U/L (ref 40–150)
BILIRUBIN TOTAL: 0.77 mg/dL (ref 0.20–1.20)
BUN: 13.2 mg/dL (ref 7.0–26.0)
CHLORIDE: 109 meq/L (ref 98–109)
CO2: 26 mEq/L (ref 22–29)
Calcium: 8.3 mg/dL — ABNORMAL LOW (ref 8.4–10.4)
Creatinine: 0.9 mg/dL (ref 0.7–1.3)
EGFR: 81 mL/min/{1.73_m2} — ABNORMAL LOW (ref >90)
Glucose: 125 mg/dl (ref 70–140)
Potassium: 3.4 mEq/L — ABNORMAL LOW (ref 3.5–5.1)
Sodium: 143 mEq/L (ref 136–145)
Total Protein: 5.9 g/dL — ABNORMAL LOW (ref 6.4–8.3)

## 2014-05-11 LAB — HOLD TUBE, BLOOD BANK

## 2014-05-11 LAB — MAGNESIUM (CC13): Magnesium: 2.2 mg/dl (ref 1.5–2.5)

## 2014-05-11 MED ORDER — SODIUM CHLORIDE 0.9 % IJ SOLN
10.0000 mL | INTRAMUSCULAR | Status: DC | PRN
  Administered 2014-05-11: 10 mL via INTRAVENOUS
  Filled 2014-05-11: qty 10

## 2014-05-11 MED ORDER — HEPARIN SOD (PORK) LOCK FLUSH 100 UNIT/ML IV SOLN
500.0000 [IU] | Freq: Once | INTRAVENOUS | Status: AC
  Administered 2014-05-11: 250 [IU] via INTRAVENOUS
  Filled 2014-05-11: qty 5

## 2014-05-11 NOTE — Patient Instructions (Signed)
PICC Home Guide A peripherally inserted central catheter (PICC) is a long, thin, flexible tube that is inserted into a vein in the upper arm. It is a form of intravenous (IV) access. It is considered to be a "central" line because the tip of the PICC ends in a large vein in your chest. This large vein is called the superior vena cava (SVC). The PICC tip ends in the SVC because there is a lot of blood flow in the SVC. This allows medicines and IV fluids to be quickly distributed throughout the body. The PICC is inserted using a sterile technique by a specially trained nurse or physician. After the PICC is inserted, a chest X-ray exam is done to be sure it is in the correct place.  A PICC may be placed for different reasons, such as:  To give medicines and liquid nutrition that can only be given through a central line. Examples are:  Certain antibiotic treatments.  Chemotherapy.  Total parenteral nutrition (TPN).  To take frequent blood samples.  To give IV fluids and blood products.  If there is difficulty placing a peripheral intravenous (PIV) catheter. If taken care of properly, a PICC can remain in place for several months. A PICC can also allow a person to go home from the hospital early. Medicine and PICC care can be managed at home by a family member or home health care team. WHAT PROBLEMS CAN HAPPEN WHEN I HAVE A PICC? Problems with a PICC can occasionally occur. These may include the following:  A blood clot (thrombus) forming in or at the tip of the PICC. This can cause the PICC to become clogged. A clot-dissolving medicine called tissue plasminogen activator (tPA) can be given through the PICC to help break up the clot.  Inflammation of the vein (phlebitis) in which the PICC is placed. Signs of inflammation may include redness, pain at the insertion site, red streaks, or being able to feel a "cord" in the vein where the PICC is located.  Infection in the PICC or at the insertion  site. Signs of infection may include fever, chills, redness, swelling, or pus drainage from the PICC insertion site.  PICC movement (malposition). The PICC tip may move from its original position due to excessive physical activity, forceful coughing, sneezing, or vomiting.  A break or cut in the PICC. It is important to not use scissors near the PICC.  Nerve or tendon irritation or injury during PICC insertion. WHAT SHOULD I KEEP IN MIND ABOUT ACTIVITIES WHEN I HAVE A PICC?  You may bend your arm and move it freely. If your PICC is near or at the bend of your elbow, avoid activity with repeated motion at the elbow.  Rest at home for the remainder of the day following PICC line insertion.  Avoid lifting heavy objects as instructed by your health care provider.  Avoid using a crutch with the arm on the same side as your PICC. You may need to use a walker. WHAT SHOULD I KNOW ABOUT MY PICC DRESSING?  Keep your PICC bandage (dressing) clean and dry to prevent infection.  Ask your health care provider when you may shower. Ask your health care provider to teach you how to wrap the PICC when you do take a shower.  Change the PICC dressing as instructed by your health care provider.  Change your PICC dressing if it becomes loose or wet. WHAT SHOULD I KNOW ABOUT PICC CARE?  Check the PICC insertion site   daily for leakage, redness, swelling, or pain.  Do not take a bath, swim, or use hot tubs when you have a PICC. Cover PICC line with clear plastic wrap and tape to keep it dry while showering.  Flush the PICC as directed by your health care provider. Let your health care provider know right away if the PICC is difficult to flush or does not flush. Do not use force to flush the PICC.  Do not use a syringe that is less than 10 mL to flush the PICC.  Never pull or tug on the PICC.  Avoid blood pressure checks on the arm with the PICC.  Keep your PICC identification card with you at all  times.  Do not take the PICC out yourself. Only a trained clinical professional should remove the PICC. SEEK IMMEDIATE MEDICAL CARE IF:  Your PICC is accidentally pulled all the way out. If this happens, cover the insertion site with a bandage or gauze dressing. Do not throw the PICC away. Your health care provider will need to inspect it.  Your PICC was tugged or pulled and has partially come out. Do not  push the PICC back in.  There is any type of drainage, redness, or swelling where the PICC enters the skin.  You cannot flush the PICC, it is difficult to flush, or the PICC leaks around the insertion site when it is flushed.  You hear a "flushing" sound when the PICC is flushed.  You have pain, discomfort, or numbness in your arm, shoulder, or jaw on the same side as the PICC.  You feel your heart "racing" or skipping beats.  You notice a hole or tear in the PICC.  You develop chills or a fever. MAKE SURE YOU:   Understand these instructions.  Will watch your condition.  Will get help right away if you are not doing well or get worse. Document Released: 10/29/2002 Document Revised: 09/08/2013 Document Reviewed: 12/30/2012 ExitCare Patient Information 2015 [employer]. This information is not intended to replace advice given to you by your health care provider. Make sure you discuss any questions you have with your health care provider.  

## 2014-05-12 ENCOUNTER — Telehealth: Payer: Self-pay | Admitting: Oncology

## 2014-05-12 ENCOUNTER — Telehealth: Payer: Self-pay | Admitting: *Deleted

## 2014-05-12 NOTE — Telephone Encounter (Signed)
Per staff message and POF I have scheduled appts. Advised scheduler of appts and first available is 1130am JMW

## 2014-05-12 NOTE — Telephone Encounter (Signed)
s.w. pt and advised on 1.8 appts....pt ok and aware

## 2014-05-14 ENCOUNTER — Other Ambulatory Visit: Payer: Non-veteran care

## 2014-05-15 ENCOUNTER — Ambulatory Visit (HOSPITAL_COMMUNITY)
Admission: RE | Admit: 2014-05-15 | Discharge: 2014-05-15 | Disposition: A | Discharge status: Home / Self Care | Payer: Medicare Other | Source: Ambulatory Visit | Attending: Oncology | Admitting: Oncology

## 2014-05-15 ENCOUNTER — Ambulatory Visit: Payer: Medicare Other

## 2014-05-15 ENCOUNTER — Other Ambulatory Visit (HOSPITAL_BASED_OUTPATIENT_CLINIC_OR_DEPARTMENT_OTHER): Payer: Medicare Other

## 2014-05-15 ENCOUNTER — Ambulatory Visit (HOSPITAL_BASED_OUTPATIENT_CLINIC_OR_DEPARTMENT_OTHER): Payer: Medicare Other

## 2014-05-15 VITALS — BP 132/78 | HR 65 | Temp 98.9°F | Resp 18

## 2014-05-15 DIAGNOSIS — C9290 Myeloid leukemia, unspecified, not having achieved remission: Secondary | ICD-10-CM | POA: Insufficient documentation

## 2014-05-15 DIAGNOSIS — C9200 Acute myeloblastic leukemia, not having achieved remission: Secondary | ICD-10-CM | POA: Insufficient documentation

## 2014-05-15 DIAGNOSIS — D72819 Decreased white blood cell count, unspecified: Secondary | ICD-10-CM

## 2014-05-15 DIAGNOSIS — C9210 Chronic myeloid leukemia, BCR/ABL-positive, not having achieved remission: Secondary | ICD-10-CM

## 2014-05-15 LAB — CBC WITH DIFFERENTIAL/PLATELET
BASO%: 0.8 % (ref 0.0–2.0)
BASOS ABS: 0 10*3/uL (ref 0.0–0.1)
EOS%: 3.1 % (ref 0.0–7.0)
Eosinophils Absolute: 0 10*3/uL (ref 0.0–0.5)
HEMATOCRIT: 25.4 % — AB (ref 38.4–49.9)
HGB: 8.3 g/dL — ABNORMAL LOW (ref 13.0–17.1)
LYMPH#: 1 10*3/uL (ref 0.9–3.3)
LYMPH%: 74.6 % — ABNORMAL HIGH (ref 14.0–49.0)
MCH: 31.4 pg (ref 27.2–33.4)
MCHC: 32.7 g/dL (ref 32.0–36.0)
MCV: 96.2 fL (ref 79.3–98.0)
MONO#: 0 10*3/uL — ABNORMAL LOW (ref 0.1–0.9)
MONO%: 1.5 % (ref 0.0–14.0)
NEUT%: 20 % — AB (ref 39.0–75.0)
NEUTROS ABS: 0.3 10*3/uL — AB (ref 1.5–6.5)
PLATELETS: 77 10*3/uL — AB (ref 140–400)
RBC: 2.64 10*6/uL — ABNORMAL LOW (ref 4.20–5.82)
RDW: 24.3 % — ABNORMAL HIGH (ref 11.0–14.6)
WBC: 1.3 10*3/uL — AB (ref 4.0–10.3)
nRBC: 0 % (ref 0–0)

## 2014-05-15 LAB — HOLD TUBE, BLOOD BANK

## 2014-05-15 LAB — PREPARE RBC (CROSSMATCH)

## 2014-05-15 MED ORDER — ACETAMINOPHEN 325 MG PO TABS
ORAL_TABLET | ORAL | Status: AC
  Filled 2014-05-15: qty 2

## 2014-05-15 MED ORDER — ACETAMINOPHEN 325 MG PO TABS
650.0000 mg | ORAL_TABLET | Freq: Once | ORAL | Status: AC
  Administered 2014-05-15: 650 mg via ORAL

## 2014-05-15 MED ORDER — SODIUM CHLORIDE 0.9 % IJ SOLN
10.0000 mL | INTRAMUSCULAR | Status: DC | PRN
  Filled 2014-05-15: qty 10

## 2014-05-15 MED ORDER — HEPARIN SOD (PORK) LOCK FLUSH 100 UNIT/ML IV SOLN
250.0000 [IU] | INTRAVENOUS | Status: DC | PRN
  Filled 2014-05-15: qty 5

## 2014-05-15 MED ORDER — FUROSEMIDE 10 MG/ML IJ SOLN
20.0000 mg | Freq: Once | INTRAMUSCULAR | Status: AC
  Administered 2014-05-15: 20 mg via INTRAVENOUS

## 2014-05-15 MED ORDER — DIPHENHYDRAMINE HCL 25 MG PO CAPS
ORAL_CAPSULE | ORAL | Status: AC
  Filled 2014-05-15: qty 1

## 2014-05-15 MED ORDER — SODIUM CHLORIDE 0.9 % IV SOLN
250.0000 mL | Freq: Once | INTRAVENOUS | Status: AC
  Administered 2014-05-15: 250 mL via INTRAVENOUS

## 2014-05-15 MED ORDER — DIPHENHYDRAMINE HCL 25 MG PO CAPS
25.0000 mg | ORAL_CAPSULE | Freq: Once | ORAL | Status: AC
  Administered 2014-05-15: 25 mg via ORAL

## 2014-05-15 NOTE — Progress Notes (Signed)
1230: Dr. Alen Blew made aware of pt's recent admission and diagnosis of congestive heart failure. Order received to run each unit of blood over two hours and give 20 mg Lasix IVP in between units.  Pt notified and verbalized understanding

## 2014-05-15 NOTE — Progress Notes (Signed)
Received a call from charge nurse [place] to assist with interpretation of this patient's CXR report on 05/04/2014.  Tip placement note as "atypical positional of left upper extremity PICC. If clinically venous, the tip is likely within a persistent left SVC. If arterial, the tip is in the descending thoracic aorta."  Back tracking back to insertion report via [place] from [place] at [facility], report stated, " Tip location: Projects over the distal left SVC in this patient with a persistent left SVC."  Dr. Alen Blew away from the office.  Called [facility] and spoke to radiologist Dr. Anselm Pancoast to assist with report interpretation. Per Dr. 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  Due to the CXR being a one view CXR,  the view shows the aorta over the SVC, does not show if pt. has an  existing right SVC. Dr. Anselm Pancoast made aware that this patient will be receiving RBC today. Recommendation from radiologist,  We could do ABG test to identify venous or arterial blood, draw blood to note colour of blood sample to identify venous or arterial blood, CT. Of chest and best option is to repeat a  Chest x-ray with 2 views for quick view.  At this time, he does not recommend the use of this PICC with best option of quick confirmation with CXR.   Recommendation for charge nurse is to proceed with peripheral line for blood transfusion today to avoid further delay of patient's procedure and notify pt. of plan to confirm tip placement. We will discuss future test to note tip with Dr. Alen Blew.  I will notify Dr. Alen Blew of above.   HL

## 2014-05-15 NOTE — Patient Instructions (Signed)

## 2014-05-16 LAB — TYPE AND SCREEN
ABO/RH(D): O POS
ANTIBODY SCREEN: NEGATIVE
UNIT DIVISION: 0
UNIT DIVISION: 0

## 2014-05-19 ENCOUNTER — Other Ambulatory Visit: Payer: Self-pay | Admitting: *Deleted

## 2014-05-19 DIAGNOSIS — C9200 Acute myeloblastic leukemia, not having achieved remission: Secondary | ICD-10-CM

## 2014-05-20 ENCOUNTER — Encounter: Payer: Self-pay | Admitting: *Deleted

## 2014-05-20 ENCOUNTER — Telehealth: Payer: Self-pay | Admitting: Oncology

## 2014-05-20 NOTE — Telephone Encounter (Signed)
, °

## 2014-05-20 NOTE — Progress Notes (Signed)
rec'd call from [place] rn @ [facility]. She states a cxr was done.  their doctors feel it is okay to use patient's picc and they will not do any further other scans. Per dr Alen Blew, okay to use present picc line

## 2014-05-22 ENCOUNTER — Other Ambulatory Visit: Payer: Self-pay | Admitting: *Deleted

## 2014-05-22 ENCOUNTER — Telehealth: Payer: Self-pay | Admitting: Certified Registered Nurse Anesthetist

## 2014-05-22 NOTE — Telephone Encounter (Signed)
On Wednesday 1/13, per Randolm Idol RN, Adonis Huguenin discussed plan of care with her in regards to the use of PICC line.  Dixie informed Adonis Huguenin to contact me also. I received a message from [place] from [facility] to discuss pt's PICC line. Attempted to call Adonis Huguenin back, phone number left  [phone] does not allow for further contact to other numbers.  Do not have Vivian's direct number.  Informed Dixie to document her conversation with Adonis Huguenin and inform Dr. Alen Blew of outcome and obtain plan of care.   At 12:00pm today, located Cameron Proud number [phone], Adonis Huguenin stated that she was off yesterday and also had already discussed this with nurse Dixie. She did repeat her conversation to me. She stated that an abg was done on this patient to confirm it is a venous line. A report was faxed to Dr. Alen Blew. If they did not get this report, please have nurse Dixie call her to have it refaxed.  Above message sent via email to both Dixie and Dr. Alen Blew.  I will go ahead and document the ABG result in this patient's file as it has not been completed at this time. HL

## 2014-05-23 ENCOUNTER — Telehealth: Payer: Self-pay | Admitting: Oncology

## 2014-05-25 ENCOUNTER — Other Ambulatory Visit: Payer: Self-pay | Admitting: *Deleted

## 2014-05-25 ENCOUNTER — Other Ambulatory Visit (HOSPITAL_BASED_OUTPATIENT_CLINIC_OR_DEPARTMENT_OTHER): Payer: Medicare Other

## 2014-05-25 ENCOUNTER — Ambulatory Visit (HOSPITAL_BASED_OUTPATIENT_CLINIC_OR_DEPARTMENT_OTHER): Payer: Medicare Other

## 2014-05-25 ENCOUNTER — Encounter: Payer: Self-pay | Admitting: *Deleted

## 2014-05-25 VITALS — BP 117/63 | HR 64 | Temp 97.8°F

## 2014-05-25 DIAGNOSIS — C9200 Acute myeloblastic leukemia, not having achieved remission: Secondary | ICD-10-CM

## 2014-05-25 DIAGNOSIS — Z452 Encounter for adjustment and management of vascular access device: Secondary | ICD-10-CM

## 2014-05-25 LAB — CBC WITH DIFFERENTIAL/PLATELET
BASO%: 0.6 % (ref 0.0–2.0)
Basophils Absolute: 0 10*3/uL (ref 0.0–0.1)
EOS ABS: 0 10*3/uL (ref 0.0–0.5)
EOS%: 1 % (ref 0.0–7.0)
HCT: 30.5 % — ABNORMAL LOW (ref 38.4–49.9)
HGB: 9.8 g/dL — ABNORMAL LOW (ref 13.0–17.1)
LYMPH%: 35.1 % (ref 14.0–49.0)
MCH: 31.5 pg (ref 27.2–33.4)
MCHC: 32.1 g/dL (ref 32.0–36.0)
MCV: 98.1 fL — AB (ref 79.3–98.0)
MONO#: 0.1 10*3/uL (ref 0.1–0.9)
MONO%: 4.2 % (ref 0.0–14.0)
NEUT#: 1.1 10*3/uL — ABNORMAL LOW (ref 1.5–6.5)
NEUT%: 59.1 % (ref 39.0–75.0)
Platelets: 80 10*3/uL — ABNORMAL LOW (ref 140–400)
RBC: 3.1 10*6/uL — ABNORMAL LOW (ref 4.20–5.82)
RDW: 24.9 % — ABNORMAL HIGH (ref 11.0–14.6)
WBC: 1.8 10*3/uL — ABNORMAL LOW (ref 4.0–10.3)
lymph#: 0.6 10*3/uL — ABNORMAL LOW (ref 0.9–3.3)

## 2014-05-25 LAB — COMPREHENSIVE METABOLIC PANEL (CC13)
ALT: <6 U/L (ref 0–55)
AST: 9 U/L (ref 5–34)
Albumin: 2.7 g/dL — ABNORMAL LOW (ref 3.5–5.0)
Alkaline Phosphatase: 64 U/L (ref 40–150)
Anion Gap: 8 mEq/L (ref 3–11)
BUN: 12.2 mg/dL (ref 7.0–26.0)
CHLORIDE: 109 meq/L (ref 98–109)
CO2: 26 mEq/L (ref 22–29)
Calcium: 7.8 mg/dL — ABNORMAL LOW (ref 8.4–10.4)
Creatinine: 1 mg/dL (ref 0.7–1.3)
EGFR: 73 mL/min/{1.73_m2} — ABNORMAL LOW (ref >90)
Glucose: 82 mg/dl (ref 70–140)
Potassium: 3.4 mEq/L — ABNORMAL LOW (ref 3.5–5.1)
Sodium: 144 mEq/L (ref 136–145)
TOTAL PROTEIN: 5.4 g/dL — AB (ref 6.4–8.3)
Total Bilirubin: 0.54 mg/dL (ref 0.20–1.20)

## 2014-05-25 LAB — MAGNESIUM (CC13): Magnesium: 2 mg/dl (ref 1.5–2.5)

## 2014-05-25 MED ORDER — SODIUM CHLORIDE 0.9 % IJ SOLN
10.0000 mL | INTRAMUSCULAR | Status: DC | PRN
  Administered 2014-05-25: 10 mL via INTRAVENOUS
  Filled 2014-05-25: qty 10

## 2014-05-25 MED ORDER — HEPARIN SOD (PORK) LOCK FLUSH 100 UNIT/ML IV SOLN
500.0000 [IU] | Freq: Once | INTRAVENOUS | Status: AC
  Administered 2014-05-25: 500 [IU] via INTRAVENOUS
  Filled 2014-05-25: qty 5

## 2014-05-25 NOTE — Patient Instructions (Signed)
PICC Home Guide A peripherally inserted central catheter (PICC) is a long, thin, flexible tube that is inserted into a vein in the upper arm. It is a form of intravenous (IV) access. It is considered to be a "central" line because the tip of the PICC ends in a large vein in your chest. This large vein is called the superior vena cava (SVC). The PICC tip ends in the SVC because there is a lot of blood flow in the SVC. This allows medicines and IV fluids to be quickly distributed throughout the body. The PICC is inserted using a sterile technique by a specially trained nurse or physician. After the PICC is inserted, a chest X-ray exam is done to be sure it is in the correct place.  A PICC may be placed for different reasons, such as:  To give medicines and liquid nutrition that can only be given through a central line. Examples are:  Certain antibiotic treatments.  Chemotherapy.  Total parenteral nutrition (TPN).  To take frequent blood samples.  To give IV fluids and blood products.  If there is difficulty placing a peripheral intravenous (PIV) catheter. If taken care of properly, a PICC can remain in place for several months. A PICC can also allow a person to go home from the hospital early. Medicine and PICC care can be managed at home by a family member or home health care team. WHAT PROBLEMS CAN HAPPEN WHEN I HAVE A PICC? Problems with a PICC can occasionally occur. These may include the following:  A blood clot (thrombus) forming in or at the tip of the PICC. This can cause the PICC to become clogged. A clot-dissolving medicine called tissue plasminogen activator (tPA) can be given through the PICC to help break up the clot.  Inflammation of the vein (phlebitis) in which the PICC is placed. Signs of inflammation may include redness, pain at the insertion site, red streaks, or being able to feel a "cord" in the vein where the PICC is located.  Infection in the PICC or at the insertion  site. Signs of infection may include fever, chills, redness, swelling, or pus drainage from the PICC insertion site.  PICC movement (malposition). The PICC tip may move from its original position due to excessive physical activity, forceful coughing, sneezing, or vomiting.  A break or cut in the PICC. It is important to not use scissors near the PICC.  Nerve or tendon irritation or injury during PICC insertion. WHAT SHOULD I KEEP IN MIND ABOUT ACTIVITIES WHEN I HAVE A PICC?  You may bend your arm and move it freely. If your PICC is near or at the bend of your elbow, avoid activity with repeated motion at the elbow.  Rest at home for the remainder of the day following PICC line insertion.  Avoid lifting heavy objects as instructed by your health care provider.  Avoid using a crutch with the arm on the same side as your PICC. You may need to use a walker. WHAT SHOULD I KNOW ABOUT MY PICC DRESSING?  Keep your PICC bandage (dressing) clean and dry to prevent infection.  Ask your health care provider when you may shower. Ask your health care provider to teach you how to wrap the PICC when you do take a shower.  Change the PICC dressing as instructed by your health care provider.  Change your PICC dressing if it becomes loose or wet. WHAT SHOULD I KNOW ABOUT PICC CARE?  Check the PICC insertion site   daily for leakage, redness, swelling, or pain.  Do not take a bath, swim, or use hot tubs when you have a PICC. Cover PICC line with clear plastic wrap and tape to keep it dry while showering.  Flush the PICC as directed by your health care provider. Let your health care provider know right away if the PICC is difficult to flush or does not flush. Do not use force to flush the PICC.  Do not use a syringe that is less than 10 mL to flush the PICC.  Never pull or tug on the PICC.  Avoid blood pressure checks on the arm with the PICC.  Keep your PICC identification card with you at all  times.  Do not take the PICC out yourself. Only a trained clinical professional should remove the PICC. SEEK IMMEDIATE MEDICAL CARE IF:  Your PICC is accidentally pulled all the way out. If this happens, cover the insertion site with a bandage or gauze dressing. Do not throw the PICC away. Your health care provider will need to inspect it.  Your PICC was tugged or pulled and has partially come out. Do not  push the PICC back in.  There is any type of drainage, redness, or swelling where the PICC enters the skin.  You cannot flush the PICC, it is difficult to flush, or the PICC leaks around the insertion site when it is flushed.  You hear a "flushing" sound when the PICC is flushed.  You have pain, discomfort, or numbness in your arm, shoulder, or jaw on the same side as the PICC.  You feel your heart "racing" or skipping beats.  You notice a hole or tear in the PICC.  You develop chills or a fever. MAKE SURE YOU:   Understand these instructions.  Will watch your condition.  Will get help right away if you are not doing well or get worse. Document Released: 10/29/2002 Document Revised: 09/08/2013 Document Reviewed: 12/30/2012 ExitCare Patient Information 2015 [employer]. This information is not intended to replace advice given to you by your health care provider. Make sure you discuss any questions you have with your health care provider.  

## 2014-05-28 ENCOUNTER — Ambulatory Visit (HOSPITAL_BASED_OUTPATIENT_CLINIC_OR_DEPARTMENT_OTHER): Payer: Medicare Other

## 2014-05-28 ENCOUNTER — Other Ambulatory Visit (HOSPITAL_BASED_OUTPATIENT_CLINIC_OR_DEPARTMENT_OTHER): Payer: Medicare Other

## 2014-05-28 VITALS — BP 136/80 | HR 68

## 2014-05-28 DIAGNOSIS — D72819 Decreased white blood cell count, unspecified: Secondary | ICD-10-CM

## 2014-05-28 DIAGNOSIS — C9200 Acute myeloblastic leukemia, not having achieved remission: Secondary | ICD-10-CM

## 2014-05-28 DIAGNOSIS — Z452 Encounter for adjustment and management of vascular access device: Secondary | ICD-10-CM

## 2014-05-28 LAB — CBC WITH DIFFERENTIAL/PLATELET
BASO%: 0 % (ref 0.0–2.0)
Basophils Absolute: 0 10*3/uL (ref 0.0–0.1)
EOS%: 0.9 % (ref 0.0–7.0)
Eosinophils Absolute: 0 10*3/uL (ref 0.0–0.5)
HCT: 31.1 % — ABNORMAL LOW (ref 38.4–49.9)
HGB: 10.2 g/dL — ABNORMAL LOW (ref 13.0–17.1)
LYMPH%: 40.2 % (ref 14.0–49.0)
MCH: 31.9 pg (ref 27.2–33.4)
MCHC: 32.8 g/dL (ref 32.0–36.0)
MCV: 97.2 fL (ref 79.3–98.0)
MONO#: 0.1 10*3/uL (ref 0.1–0.9)
MONO%: 3.1 % (ref 0.0–14.0)
NEUT%: 55.8 % (ref 39.0–75.0)
NEUTROS ABS: 1.3 10*3/uL — AB (ref 1.5–6.5)
PLATELETS: 51 10*3/uL — AB (ref 140–400)
RBC: 3.2 10*6/uL — ABNORMAL LOW (ref 4.20–5.82)
RDW: 21.7 % — ABNORMAL HIGH (ref 11.0–14.6)
WBC: 2.3 10*3/uL — ABNORMAL LOW (ref 4.0–10.3)
lymph#: 0.9 10*3/uL (ref 0.9–3.3)
nRBC: 0 % (ref 0–0)

## 2014-05-28 LAB — HOLD TUBE, BLOOD BANK

## 2014-05-28 MED ORDER — SODIUM CHLORIDE 0.9 % IJ SOLN
10.0000 mL | INTRAMUSCULAR | Status: DC | PRN
  Administered 2014-05-28: 10 mL via INTRAVENOUS
  Filled 2014-05-28: qty 10

## 2014-05-28 MED ORDER — HEPARIN SOD (PORK) LOCK FLUSH 100 UNIT/ML IV SOLN
500.0000 [IU] | Freq: Once | INTRAVENOUS | Status: AC
  Administered 2014-05-28: 250 [IU] via INTRAVENOUS
  Filled 2014-05-28: qty 5

## 2014-05-28 NOTE — Patient Instructions (Signed)
PICC Home Guide A peripherally inserted central catheter (PICC) is a long, thin, flexible tube that is inserted into a vein in the upper arm. It is a form of intravenous (IV) access. It is considered to be a "central" line because the tip of the PICC ends in a large vein in your chest. This large vein is called the superior vena cava (SVC). The PICC tip ends in the SVC because there is a lot of blood flow in the SVC. This allows medicines and IV fluids to be quickly distributed throughout the body. The PICC is inserted using a sterile technique by a specially trained nurse or physician. After the PICC is inserted, a chest X-ray exam is done to be sure it is in the correct place.  A PICC may be placed for different reasons, such as:  To give medicines and liquid nutrition that can only be given through a central line. Examples are:  Certain antibiotic treatments.  Chemotherapy.  Total parenteral nutrition (TPN).  To take frequent blood samples.  To give IV fluids and blood products.  If there is difficulty placing a peripheral intravenous (PIV) catheter. If taken care of properly, a PICC can remain in place for several months. A PICC can also allow a person to go home from the hospital early. Medicine and PICC care can be managed at home by a family member or home health care team. WHAT PROBLEMS CAN HAPPEN WHEN I HAVE A PICC? Problems with a PICC can occasionally occur. These may include the following:  A blood clot (thrombus) forming in or at the tip of the PICC. This can cause the PICC to become clogged. A clot-dissolving medicine called tissue plasminogen activator (tPA) can be given through the PICC to help break up the clot.  Inflammation of the vein (phlebitis) in which the PICC is placed. Signs of inflammation may include redness, pain at the insertion site, red streaks, or being able to feel a "cord" in the vein where the PICC is located.  Infection in the PICC or at the insertion  site. Signs of infection may include fever, chills, redness, swelling, or pus drainage from the PICC insertion site.  PICC movement (malposition). The PICC tip may move from its original position due to excessive physical activity, forceful coughing, sneezing, or vomiting.  A break or cut in the PICC. It is important to not use scissors near the PICC.  Nerve or tendon irritation or injury during PICC insertion. WHAT SHOULD I KEEP IN MIND ABOUT ACTIVITIES WHEN I HAVE A PICC?  You may bend your arm and move it freely. If your PICC is near or at the bend of your elbow, avoid activity with repeated motion at the elbow.  Rest at home for the remainder of the day following PICC line insertion.  Avoid lifting heavy objects as instructed by your health care provider.  Avoid using a crutch with the arm on the same side as your PICC. You may need to use a walker. WHAT SHOULD I KNOW ABOUT MY PICC DRESSING?  Keep your PICC bandage (dressing) clean and dry to prevent infection.  Ask your health care provider when you may shower. Ask your health care provider to teach you how to wrap the PICC when you do take a shower.  Change the PICC dressing as instructed by your health care provider.  Change your PICC dressing if it becomes loose or wet. WHAT SHOULD I KNOW ABOUT PICC CARE?  Check the PICC insertion site   daily for leakage, redness, swelling, or pain.  Do not take a bath, swim, or use hot tubs when you have a PICC. Cover PICC line with clear plastic wrap and tape to keep it dry while showering.  Flush the PICC as directed by your health care provider. Let your health care provider know right away if the PICC is difficult to flush or does not flush. Do not use force to flush the PICC.  Do not use a syringe that is less than 10 mL to flush the PICC.  Never pull or tug on the PICC.  Avoid blood pressure checks on the arm with the PICC.  Keep your PICC identification card with you at all  times.  Do not take the PICC out yourself. Only a trained clinical professional should remove the PICC. SEEK IMMEDIATE MEDICAL CARE IF:  Your PICC is accidentally pulled all the way out. If this happens, cover the insertion site with a bandage or gauze dressing. Do not throw the PICC away. Your health care provider will need to inspect it.  Your PICC was tugged or pulled and has partially come out. Do not  push the PICC back in.  There is any type of drainage, redness, or swelling where the PICC enters the skin.  You cannot flush the PICC, it is difficult to flush, or the PICC leaks around the insertion site when it is flushed.  You hear a "flushing" sound when the PICC is flushed.  You have pain, discomfort, or numbness in your arm, shoulder, or jaw on the same side as the PICC.  You feel your heart "racing" or skipping beats.  You notice a hole or tear in the PICC.  You develop chills or a fever. MAKE SURE YOU:   Understand these instructions.  Will watch your condition.  Will get help right away if you are not doing well or get worse. Document Released: 10/29/2002 Document Revised: 09/08/2013 Document Reviewed: 12/30/2012 ExitCare Patient Information 2015 [employer]. This information is not intended to replace advice given to you by your health care provider. Make sure you discuss any questions you have with your health care provider.  

## 2014-06-01 ENCOUNTER — Other Ambulatory Visit (HOSPITAL_BASED_OUTPATIENT_CLINIC_OR_DEPARTMENT_OTHER): Payer: Medicare Other

## 2014-06-01 ENCOUNTER — Telehealth: Payer: Self-pay | Admitting: *Deleted

## 2014-06-01 ENCOUNTER — Telehealth: Payer: Self-pay | Admitting: Oncology

## 2014-06-01 ENCOUNTER — Ambulatory Visit (HOSPITAL_BASED_OUTPATIENT_CLINIC_OR_DEPARTMENT_OTHER): Payer: Medicare Other

## 2014-06-01 ENCOUNTER — Encounter: Payer: Self-pay | Admitting: *Deleted

## 2014-06-01 DIAGNOSIS — Z452 Encounter for adjustment and management of vascular access device: Secondary | ICD-10-CM

## 2014-06-01 DIAGNOSIS — C9200 Acute myeloblastic leukemia, not having achieved remission: Secondary | ICD-10-CM

## 2014-06-01 LAB — MAGNESIUM (CC13): Magnesium: 2 mg/dl (ref 1.5–2.5)

## 2014-06-01 LAB — CBC WITH DIFFERENTIAL/PLATELET
BASO%: 0 % (ref 0.0–2.0)
Basophils Absolute: 0 10*3/uL (ref 0.0–0.1)
EOS ABS: 0 10*3/uL (ref 0.0–0.5)
EOS%: 1.8 % (ref 0.0–7.0)
HEMATOCRIT: 29.9 % — AB (ref 38.4–49.9)
HGB: 9.9 g/dL — ABNORMAL LOW (ref 13.0–17.1)
LYMPH%: 41.7 % (ref 14.0–49.0)
MCH: 32 pg (ref 27.2–33.4)
MCHC: 33.1 g/dL (ref 32.0–36.0)
MCV: 96.8 fL (ref 79.3–98.0)
MONO#: 0.1 10*3/uL (ref 0.1–0.9)
MONO%: 3 % (ref 0.0–14.0)
NEUT#: 0.9 10*3/uL — ABNORMAL LOW (ref 1.5–6.5)
NEUT%: 53.5 % (ref 39.0–75.0)
Platelets: 22 10*3/uL — ABNORMAL LOW (ref 140–400)
RBC: 3.09 10*6/uL — ABNORMAL LOW (ref 4.20–5.82)
RDW: 21.5 % — AB (ref 11.0–14.6)
WBC: 1.7 10*3/uL — ABNORMAL LOW (ref 4.0–10.3)
lymph#: 0.7 10*3/uL — ABNORMAL LOW (ref 0.9–3.3)
nRBC: 0 % (ref 0–0)

## 2014-06-01 LAB — COMPREHENSIVE METABOLIC PANEL (CC13)
ALK PHOS: 71 U/L (ref 40–150)
ALT: 7 U/L (ref 0–55)
ANION GAP: 11 meq/L (ref 3–11)
AST: 11 U/L (ref 5–34)
Albumin: 3.1 g/dL — ABNORMAL LOW (ref 3.5–5.0)
BUN: 12.9 mg/dL (ref 7.0–26.0)
CALCIUM: 8.2 mg/dL — AB (ref 8.4–10.4)
CO2: 25 mEq/L (ref 22–29)
CREATININE: 0.9 mg/dL (ref 0.7–1.3)
Chloride: 109 mEq/L (ref 98–109)
EGFR: 80 mL/min/{1.73_m2} — ABNORMAL LOW (ref >90)
GLUCOSE: 111 mg/dL (ref 70–140)
POTASSIUM: 3.1 meq/L — AB (ref 3.5–5.1)
Sodium: 145 mEq/L (ref 136–145)
TOTAL PROTEIN: 6.1 g/dL — AB (ref 6.4–8.3)
Total Bilirubin: 0.82 mg/dL (ref 0.20–1.20)

## 2014-06-01 LAB — HOLD TUBE, BLOOD BANK

## 2014-06-01 MED ORDER — HEPARIN SOD (PORK) LOCK FLUSH 100 UNIT/ML IV SOLN
500.0000 [IU] | Freq: Once | INTRAVENOUS | Status: AC
  Administered 2014-06-01: 250 [IU] via INTRAVENOUS
  Filled 2014-06-01: qty 5

## 2014-06-01 MED ORDER — SODIUM CHLORIDE 0.9 % IJ SOLN
10.0000 mL | INTRAMUSCULAR | Status: DC | PRN
  Administered 2014-06-01: 10 mL via INTRAVENOUS
  Filled 2014-06-01: qty 10

## 2014-06-01 NOTE — Progress Notes (Signed)
No need for blood transfusion today, per dr Alen Blew

## 2014-06-01 NOTE — Telephone Encounter (Signed)
s.w. pt and advised on Jan appts...ok and aware

## 2014-06-01 NOTE — Patient Instructions (Signed)
PICC Home Guide A peripherally inserted central catheter (PICC) is a long, thin, flexible tube that is inserted into a vein in the upper arm. It is a form of intravenous (IV) access. It is considered to be a "central" line because the tip of the PICC ends in a large vein in your chest. This large vein is called the superior vena cava (SVC). The PICC tip ends in the SVC because there is a lot of blood flow in the SVC. This allows medicines and IV fluids to be quickly distributed throughout the body. The PICC is inserted using a sterile technique by a specially trained nurse or physician. After the PICC is inserted, a chest X-ray exam is done to be sure it is in the correct place.  A PICC may be placed for different reasons, such as:  To give medicines and liquid nutrition that can only be given through a central line. Examples are:  Certain antibiotic treatments.  Chemotherapy.  Total parenteral nutrition (TPN).  To take frequent blood samples.  To give IV fluids and blood products.  If there is difficulty placing a peripheral intravenous (PIV) catheter. If taken care of properly, a PICC can remain in place for several months. A PICC can also allow a person to go home from the hospital early. Medicine and PICC care can be managed at home by a family member or home health care team. WHAT PROBLEMS CAN HAPPEN WHEN I HAVE A PICC? Problems with a PICC can occasionally occur. These may include the following:  A blood clot (thrombus) forming in or at the tip of the PICC. This can cause the PICC to become clogged. A clot-dissolving medicine called tissue plasminogen activator (tPA) can be given through the PICC to help break up the clot.  Inflammation of the vein (phlebitis) in which the PICC is placed. Signs of inflammation may include redness, pain at the insertion site, red streaks, or being able to feel a "cord" in the vein where the PICC is located.  Infection in the PICC or at the insertion  site. Signs of infection may include fever, chills, redness, swelling, or pus drainage from the PICC insertion site.  PICC movement (malposition). The PICC tip may move from its original position due to excessive physical activity, forceful coughing, sneezing, or vomiting.  A break or cut in the PICC. It is important to not use scissors near the PICC.  Nerve or tendon irritation or injury during PICC insertion. WHAT SHOULD I KEEP IN MIND ABOUT ACTIVITIES WHEN I HAVE A PICC?  You may bend your arm and move it freely. If your PICC is near or at the bend of your elbow, avoid activity with repeated motion at the elbow.  Rest at home for the remainder of the day following PICC line insertion.  Avoid lifting heavy objects as instructed by your health care provider.  Avoid using a crutch with the arm on the same side as your PICC. You may need to use a walker. WHAT SHOULD I KNOW ABOUT MY PICC DRESSING?  Keep your PICC bandage (dressing) clean and dry to prevent infection.  Ask your health care provider when you may shower. Ask your health care provider to teach you how to wrap the PICC when you do take a shower.  Change the PICC dressing as instructed by your health care provider.  Change your PICC dressing if it becomes loose or wet. WHAT SHOULD I KNOW ABOUT PICC CARE?  Check the PICC insertion site   daily for leakage, redness, swelling, or pain.  Do not take a bath, swim, or use hot tubs when you have a PICC. Cover PICC line with clear plastic wrap and tape to keep it dry while showering.  Flush the PICC as directed by your health care provider. Let your health care provider know right away if the PICC is difficult to flush or does not flush. Do not use force to flush the PICC.  Do not use a syringe that is less than 10 mL to flush the PICC.  Never pull or tug on the PICC.  Avoid blood pressure checks on the arm with the PICC.  Keep your PICC identification card with you at all  times.  Do not take the PICC out yourself. Only a trained clinical professional should remove the PICC. SEEK IMMEDIATE MEDICAL CARE IF:  Your PICC is accidentally pulled all the way out. If this happens, cover the insertion site with a bandage or gauze dressing. Do not throw the PICC away. Your health care provider will need to inspect it.  Your PICC was tugged or pulled and has partially come out. Do not  push the PICC back in.  There is any type of drainage, redness, or swelling where the PICC enters the skin.  You cannot flush the PICC, it is difficult to flush, or the PICC leaks around the insertion site when it is flushed.  You hear a "flushing" sound when the PICC is flushed.  You have pain, discomfort, or numbness in your arm, shoulder, or jaw on the same side as the PICC.  You feel your heart "racing" or skipping beats.  You notice a hole or tear in the PICC.  You develop chills or a fever. MAKE SURE YOU:   Understand these instructions.  Will watch your condition.  Will get help right away if you are not doing well or get worse. Document Released: 10/29/2002 Document Revised: 09/08/2013 Document Reviewed: 12/30/2012 ExitCare Patient Information 2015 [employer]. This information is not intended to replace advice given to you by your health care provider. Make sure you discuss any questions you have with your health care provider.  

## 2014-06-01 NOTE — Telephone Encounter (Signed)
Per staff message and POF I have scheduled appts. Advised scheduler of appts and to move lab/flush appts. No avail;able on 2/1 sickle cell center [place]

## 2014-06-04 ENCOUNTER — Other Ambulatory Visit (HOSPITAL_BASED_OUTPATIENT_CLINIC_OR_DEPARTMENT_OTHER): Payer: Medicare Other

## 2014-06-04 ENCOUNTER — Encounter: Payer: Self-pay | Admitting: *Deleted

## 2014-06-04 ENCOUNTER — Ambulatory Visit (HOSPITAL_BASED_OUTPATIENT_CLINIC_OR_DEPARTMENT_OTHER): Payer: Medicare Other

## 2014-06-04 ENCOUNTER — Other Ambulatory Visit: Payer: Self-pay | Admitting: *Deleted

## 2014-06-04 ENCOUNTER — Other Ambulatory Visit: Payer: Medicare Other

## 2014-06-04 ENCOUNTER — Ambulatory Visit (HOSPITAL_BASED_OUTPATIENT_CLINIC_OR_DEPARTMENT_OTHER): Payer: Non-veteran care

## 2014-06-04 VITALS — BP 118/65 | HR 68 | Temp 98.0°F | Resp 18

## 2014-06-04 DIAGNOSIS — Z452 Encounter for adjustment and management of vascular access device: Secondary | ICD-10-CM

## 2014-06-04 DIAGNOSIS — C9200 Acute myeloblastic leukemia, not having achieved remission: Secondary | ICD-10-CM

## 2014-06-04 DIAGNOSIS — D72819 Decreased white blood cell count, unspecified: Secondary | ICD-10-CM

## 2014-06-04 DIAGNOSIS — C9210 Chronic myeloid leukemia, BCR/ABL-positive, not having achieved remission: Secondary | ICD-10-CM

## 2014-06-04 LAB — HOLD TUBE, BLOOD BANK

## 2014-06-04 LAB — CBC WITH DIFFERENTIAL/PLATELET
BASO%: 0 % (ref 0.0–2.0)
Basophils Absolute: 0 10*3/uL (ref 0.0–0.1)
EOS%: 2.1 % (ref 0.0–7.0)
Eosinophils Absolute: 0 10*3/uL (ref 0.0–0.5)
HEMATOCRIT: 25.3 % — AB (ref 38.4–49.9)
HEMOGLOBIN: 8.5 g/dL — AB (ref 13.0–17.1)
LYMPH#: 0.6 10*3/uL — AB (ref 0.9–3.3)
LYMPH%: 56.7 % — ABNORMAL HIGH (ref 14.0–49.0)
MCH: 32.8 pg (ref 27.2–33.4)
MCHC: 33.6 g/dL (ref 32.0–36.0)
MCV: 97.7 fL (ref 79.3–98.0)
MONO#: 0 10*3/uL — AB (ref 0.1–0.9)
MONO%: 3.1 % (ref 0.0–14.0)
NEUT#: 0.4 10*3/uL — CL (ref 1.5–6.5)
NEUT%: 38.1 % — AB (ref 39.0–75.0)
PLATELETS: 15 10*3/uL — AB (ref 140–400)
RBC: 2.59 10*6/uL — ABNORMAL LOW (ref 4.20–5.82)
RDW: 21.4 % — AB (ref 11.0–14.6)
WBC: 1 10*3/uL — AB (ref 4.0–10.3)

## 2014-06-04 LAB — PREPARE RBC (CROSSMATCH)

## 2014-06-04 MED ORDER — DIPHENHYDRAMINE HCL 25 MG PO CAPS
ORAL_CAPSULE | ORAL | Status: AC
  Filled 2014-06-04: qty 1

## 2014-06-04 MED ORDER — HEPARIN SOD (PORK) LOCK FLUSH 100 UNIT/ML IV SOLN
500.0000 [IU] | Freq: Once | INTRAVENOUS | Status: AC
  Administered 2014-06-04: 500 [IU] via INTRAVENOUS
  Filled 2014-06-04: qty 5

## 2014-06-04 MED ORDER — SODIUM CHLORIDE 0.9 % IJ SOLN
10.0000 mL | INTRAMUSCULAR | Status: DC | PRN
  Administered 2014-06-04: 10 mL via INTRAVENOUS
  Filled 2014-06-04: qty 10

## 2014-06-04 MED ORDER — HEPARIN SOD (PORK) LOCK FLUSH 100 UNIT/ML IV SOLN
250.0000 [IU] | INTRAVENOUS | Status: AC | PRN
  Administered 2014-06-04: 250 [IU]
  Filled 2014-06-04: qty 5

## 2014-06-04 MED ORDER — ACETAMINOPHEN 325 MG PO TABS
ORAL_TABLET | ORAL | Status: AC
  Filled 2014-06-04: qty 2

## 2014-06-04 MED ORDER — ACETAMINOPHEN 325 MG PO TABS
650.0000 mg | ORAL_TABLET | Freq: Once | ORAL | Status: AC
  Administered 2014-06-04: 650 mg via ORAL

## 2014-06-04 MED ORDER — DIPHENHYDRAMINE HCL 25 MG PO CAPS
25.0000 mg | ORAL_CAPSULE | Freq: Once | ORAL | Status: AC
  Administered 2014-06-04: 25 mg via ORAL

## 2014-06-04 MED ORDER — FUROSEMIDE 10 MG/ML IJ SOLN
20.0000 mg | Freq: Once | INTRAMUSCULAR | Status: AC
  Administered 2014-06-04: 20 mg via INTRAVENOUS

## 2014-06-04 NOTE — Patient Instructions (Signed)
Blood Transfusion Information WHAT IS A BLOOD TRANSFUSION? A transfusion is the replacement of blood or some of its parts. Blood is made up of multiple cells which provide different functions.  Red blood cells carry oxygen and are used for blood loss replacement.  White blood cells fight against infection.  Platelets control bleeding.  Plasma helps clot blood.  Other blood products are available for specialized needs, such as hemophilia or other clotting disorders. BEFORE THE TRANSFUSION  Who gives blood for transfusions?   You may be able to donate blood to be used at a later date on yourself (autologous donation).  Relatives can be asked to donate blood. This is generally not any safer than if you have received blood from a stranger. The same precautions are taken to ensure safety when a relative's blood is donated.  Healthy volunteers who are fully evaluated to make sure their blood is safe. This is blood bank blood. Transfusion therapy is the safest it has ever been in the practice of medicine. Before blood is taken from a donor, a complete history is taken to make sure that person has no history of diseases nor engages in risky social behavior (examples are intravenous drug use or sexual activity with multiple partners). The donor's travel history is screened to minimize risk of transmitting infections, such as malaria. The donated blood is tested for signs of infectious diseases, such as HIV and hepatitis. The blood is then tested to be sure it is compatible with you in order to minimize the chance of a transfusion reaction. If you or a relative donates blood, this is often done in anticipation of surgery and is not appropriate for emergency situations. It takes many days to process the donated blood. RISKS AND COMPLICATIONS Although transfusion therapy is very safe and saves many lives, the main dangers of transfusion include:   Getting an infectious disease.  Developing a  transfusion reaction. This is an allergic reaction to something in the blood you were given. Every precaution is taken to prevent this. The decision to have a blood transfusion has been considered carefully by your caregiver before blood is given. Blood is not given unless the benefits outweigh the risks. AFTER THE TRANSFUSION  Right after receiving a blood transfusion, you will usually feel much better and more energetic. This is especially true if your red blood cells have gotten low (anemic). The transfusion raises the level of the red blood cells which carry oxygen, and this usually causes an energy increase.  The nurse administering the transfusion will monitor you carefully for complications. HOME CARE INSTRUCTIONS  No special instructions are needed after a transfusion. You may find your energy is better. Speak with your caregiver about any limitations on activity for underlying diseases you may have. SEEK MEDICAL CARE IF:   Your condition is not improving after your transfusion.  You develop redness or irritation at the intravenous (IV) site. SEEK IMMEDIATE MEDICAL CARE IF:  Any of the following symptoms occur over the next 12 hours:  Shaking chills.  You have a temperature by mouth above 102 F (38.9 C), not controlled by medicine.  Chest, back, or muscle pain.  People around you feel you are not acting correctly or are confused.  Shortness of breath or difficulty breathing.  Dizziness and fainting.  You get a rash or develop hives.  You have a decrease in urine output.  Your urine turns a dark color or changes to pink, red, or brown. Any of the following   symptoms occur over the next 10 days:  You have a temperature by mouth above 102 F (38.9 C), not controlled by medicine.  Shortness of breath.  Weakness after normal activity.  The white part of the eye turns yellow (jaundice).  You have a decrease in the amount of urine or are urinating less often.  Your  urine turns a dark color or changes to pink, red, or brown. Document Released: 04/21/2000 Document Revised: 07/17/2011 Document Reviewed: 12/09/2007 ExitCare Patient Information 2015 [employer]. This information is not intended to replace advice given to you by your health care provider. Make sure you discuss any questions you have with your health care provider.  Platelet Transfusion Information This is information about transfusions of platelets. Platelets are tiny cells made by the bone marrow and found in the blood. When a blood vessel is damaged, platelets rush to the damaged area to help form a clot. This begins the healing process. When platelets get very low, your blood may have trouble clotting. This may be from:  Illness.  Blood disorder.  Chemotherapy to treat cancer. Often, lower platelet counts do not cause problems.  Platelets usually last for 7 to 10 days. If they are not used in an injury, they are broken down by the liver or spleen. Symptoms of low platelet count include:  Nosebleeds.  Bleeding gums.  Heavy periods.  Bruising and tiny blood spots in the skin.  Pinpoint spots of bleeding (petechiae).  Larger bruises (purpura).  Bleeding can be more serious if it happens in the brain or bowel. Platelet transfusions are often used to keep the platelet count at an acceptable level. Serious bleeding due to low platelets is uncommon. RISKS AND COMPLICATIONS Severe side effects from platelet transfusions are uncommon. Minor reactions may include:  Itching.  Rashes.  High temperature and shivering. Medications are available to stop transfusion reactions. Let your health care provider know if you develop any of the above problems.  If you are having platelet transfusions frequently, they may get less effective. This is called becoming refractory to platelets. It is uncommon. This can happen from non-immune causes and immune causes. Non-immune causes  include:  High temperatures.  Some medications.  An enlarged spleen. Immune causes happen when your body discovers the platelets are not your own and begins making antibodies against them. The antibodies kill the platelets quickly. Even with platelet transfusions, you may still notice problems with bleeding or bruising. Let your health care providers know about this. Other things can be done to help if this happens.  BEFORE THE PROCEDURE   Your health care provider will check your platelet count regularly.  If the platelet count is too low, it may be necessary to have a platelet transfusion.  This is more important before certain procedures with a risk of bleeding, such as a spinal tap.  Platelet transfusion reduces the risk of bleeding during or after the procedure.  Except in emergencies, giving a transfusion requires a written consent. Before blood is taken from a donor, a complete history is taken to make sure the person has no history of previous diseases, nor engages in risky social behavior. Examples of this are intravenous drug use or sexual activity with multiple partners. This could lead to infected blood or blood products being used. This history is taken in spite of the extensive testing to make sure the blood is safe. All blood products transfused are tested to make sure it is a match for the person getting   the blood. It is also checked for infections. Blood is the safest it has ever been. The risk of getting an infection is very low. PROCEDURE  The platelets are stored in small plastic bags that are kept at a low temperature.  Each bag is called a unit and sometimes two units are given. They are given through an intravenous line by drip infusion over about one-half hour.  Usually blood is collected from multiple people to get enough to transfuse.  Sometimes, the platelets are collected from a single person. This is done using a special machine that separates the platelets  from the blood. The machine is called an apheresis machine. Platelets collected in this way are called apheresed platelets. Apheresed platelets reduce the risk of becoming sensitive to the platelets. This lowers the chances of having a transfusion reaction.  As it only takes a short time to give the platelets, this treatment can be given in an outpatient department. Platelets can also be given before or after other treatments. SEEK IMMEDIATE MEDICAL CARE IF: You have any of the following symptoms over the next 12 hours or several days:  Shaking chills.  Fever with a temperature greater than 102F (38.9C) develops.  Back pain or muscle pain.  People around you feel you are not acting correctly, or you are confused.  Blood in the urine or bowel movements, or bleeding from any place in your body.  Shortness of breath, or difficulty breathing.  Dizziness.  Fainting.  You break out in a rash or develop hives.  Decrease in the amount of urine you are putting out, or the urine turns a dark color or changes to pink, red, or brown.  A severe headache or stiff neck.  Bruising more easily. Document Released: 02/19/2007 Document Revised: 09/08/2013 Document Reviewed: 02/19/2007 ExitCare Patient Information 2015 [employer]. This information is not intended to replace advice given to you by your health care provider. Make sure you discuss any questions you have with your health care provider.  

## 2014-06-05 LAB — TYPE AND SCREEN
ABO/RH(D): O POS
Antibody Screen: NEGATIVE
Unit division: 0

## 2014-06-05 LAB — PREPARE PLATELET PHERESIS: UNIT DIVISION: 0

## 2014-06-08 ENCOUNTER — Ambulatory Visit (HOSPITAL_COMMUNITY)
Admission: RE | Admit: 2014-06-08 | Discharge: 2014-06-08 | Disposition: A | Discharge status: Home / Self Care | Payer: Medicare Other | Source: Ambulatory Visit | Attending: Oncology | Admitting: Oncology

## 2014-06-08 ENCOUNTER — Other Ambulatory Visit (HOSPITAL_BASED_OUTPATIENT_CLINIC_OR_DEPARTMENT_OTHER): Payer: Medicare Other

## 2014-06-08 ENCOUNTER — Ambulatory Visit (HOSPITAL_BASED_OUTPATIENT_CLINIC_OR_DEPARTMENT_OTHER): Payer: Medicare Other

## 2014-06-08 VITALS — BP 117/68 | HR 67 | Temp 98.4°F

## 2014-06-08 DIAGNOSIS — C9200 Acute myeloblastic leukemia, not having achieved remission: Secondary | ICD-10-CM

## 2014-06-08 DIAGNOSIS — Z452 Encounter for adjustment and management of vascular access device: Secondary | ICD-10-CM

## 2014-06-08 LAB — CBC WITH DIFFERENTIAL/PLATELET
BASO%: 1.1 % (ref 0.0–2.0)
BASOS ABS: 0 10*3/uL (ref 0.0–0.1)
EOS%: 2.2 % (ref 0.0–7.0)
Eosinophils Absolute: 0 10*3/uL (ref 0.0–0.5)
HCT: 27.8 % — ABNORMAL LOW (ref 38.4–49.9)
HGB: 9.2 g/dL — ABNORMAL LOW (ref 13.0–17.1)
LYMPH%: 76.7 % — AB (ref 14.0–49.0)
MCH: 32.7 pg (ref 27.2–33.4)
MCHC: 33.1 g/dL (ref 32.0–36.0)
MCV: 98.9 fL — AB (ref 79.3–98.0)
MONO#: 0 10*3/uL — ABNORMAL LOW (ref 0.1–0.9)
MONO%: 1.1 % (ref 0.0–14.0)
NEUT%: 18.9 % — AB (ref 39.0–75.0)
NEUTROS ABS: 0.2 10*3/uL — AB (ref 1.5–6.5)
NRBC: 0 % (ref 0–0)
Platelets: 34 10*3/uL — ABNORMAL LOW (ref 140–400)
RBC: 2.81 10*6/uL — ABNORMAL LOW (ref 4.20–5.82)
RDW: 21.2 % — AB (ref 11.0–14.6)
WBC: 0.9 10*3/uL — AB (ref 4.0–10.3)
lymph#: 0.7 10*3/uL — ABNORMAL LOW (ref 0.9–3.3)

## 2014-06-08 LAB — COMPREHENSIVE METABOLIC PANEL (CC13)
ALT: 6 U/L (ref 0–55)
AST: 7 U/L (ref 5–34)
Albumin: 2.7 g/dL — ABNORMAL LOW (ref 3.5–5.0)
Alkaline Phosphatase: 67 U/L (ref 40–150)
Anion Gap: 12 mEq/L — ABNORMAL HIGH (ref 3–11)
BUN: 13.9 mg/dL (ref 7.0–26.0)
CALCIUM: 8 mg/dL — AB (ref 8.4–10.4)
CO2: 26 mEq/L (ref 22–29)
Chloride: 106 mEq/L (ref 98–109)
Creatinine: 0.9 mg/dL (ref 0.7–1.3)
EGFR: 84 mL/min/{1.73_m2} — ABNORMAL LOW (ref >90)
GLUCOSE: 102 mg/dL (ref 70–140)
POTASSIUM: 3.2 meq/L — AB (ref 3.5–5.1)
Sodium: 144 mEq/L (ref 136–145)
TOTAL PROTEIN: 5.6 g/dL — AB (ref 6.4–8.3)
Total Bilirubin: 0.68 mg/dL (ref 0.20–1.20)

## 2014-06-08 LAB — HOLD TUBE, BLOOD BANK

## 2014-06-08 LAB — MAGNESIUM (CC13): Magnesium: 2.1 mg/dl (ref 1.5–2.5)

## 2014-06-08 LAB — TECHNOLOGIST REVIEW

## 2014-06-08 MED ORDER — SODIUM CHLORIDE 0.9 % IJ SOLN
10.0000 mL | INTRAMUSCULAR | Status: DC | PRN
  Administered 2014-06-08: 10 mL via INTRAVENOUS
  Filled 2014-06-08: qty 10

## 2014-06-08 MED ORDER — HEPARIN SOD (PORK) LOCK FLUSH 100 UNIT/ML IV SOLN
500.0000 [IU] | Freq: Once | INTRAVENOUS | Status: AC
  Administered 2014-06-08: 500 [IU] via INTRAVENOUS
  Filled 2014-06-08: qty 5

## 2014-06-08 NOTE — Patient Instructions (Signed)
PICC Home Guide A peripherally inserted central catheter (PICC) is a long, thin, flexible tube that is inserted into a vein in the upper arm. It is a form of intravenous (IV) access. It is considered to be a "central" line because the tip of the PICC ends in a large vein in your chest. This large vein is called the superior vena cava (SVC). The PICC tip ends in the SVC because there is a lot of blood flow in the SVC. This allows medicines and IV fluids to be quickly distributed throughout the body. The PICC is inserted using a sterile technique by a specially trained nurse or physician. After the PICC is inserted, a chest X-ray exam is done to be sure it is in the correct place.  A PICC may be placed for different reasons, such as:  To give medicines and liquid nutrition that can only be given through a central line. Examples are:  Certain antibiotic treatments.  Chemotherapy.  Total parenteral nutrition (TPN).  To take frequent blood samples.  To give IV fluids and blood products.  If there is difficulty placing a peripheral intravenous (PIV) catheter. If taken care of properly, a PICC can remain in place for several months. A PICC can also allow a person to go home from the hospital early. Medicine and PICC care can be managed at home by a family member or home health care team. WHAT PROBLEMS CAN HAPPEN WHEN I HAVE A PICC? Problems with a PICC can occasionally occur. These may include the following:  A blood clot (thrombus) forming in or at the tip of the PICC. This can cause the PICC to become clogged. A clot-dissolving medicine called tissue plasminogen activator (tPA) can be given through the PICC to help break up the clot.  Inflammation of the vein (phlebitis) in which the PICC is placed. Signs of inflammation may include redness, pain at the insertion site, red streaks, or being able to feel a "cord" in the vein where the PICC is located.  Infection in the PICC or at the insertion  site. Signs of infection may include fever, chills, redness, swelling, or pus drainage from the PICC insertion site.  PICC movement (malposition). The PICC tip may move from its original position due to excessive physical activity, forceful coughing, sneezing, or vomiting.  A break or cut in the PICC. It is important to not use scissors near the PICC.  Nerve or tendon irritation or injury during PICC insertion. WHAT SHOULD I KEEP IN MIND ABOUT ACTIVITIES WHEN I HAVE A PICC?  You may bend your arm and move it freely. If your PICC is near or at the bend of your elbow, avoid activity with repeated motion at the elbow.  Rest at home for the remainder of the day following PICC line insertion.  Avoid lifting heavy objects as instructed by your health care provider.  Avoid using a crutch with the arm on the same side as your PICC. You may need to use a walker. WHAT SHOULD I KNOW ABOUT MY PICC DRESSING?  Keep your PICC bandage (dressing) clean and dry to prevent infection.  Ask your health care provider when you may shower. Ask your health care provider to teach you how to wrap the PICC when you do take a shower.  Change the PICC dressing as instructed by your health care provider.  Change your PICC dressing if it becomes loose or wet. WHAT SHOULD I KNOW ABOUT PICC CARE?  Check the PICC insertion site   daily for leakage, redness, swelling, or pain.  Do not take a bath, swim, or use hot tubs when you have a PICC. Cover PICC line with clear plastic wrap and tape to keep it dry while showering.  Flush the PICC as directed by your health care provider. Let your health care provider know right away if the PICC is difficult to flush or does not flush. Do not use force to flush the PICC.  Do not use a syringe that is less than 10 mL to flush the PICC.  Never pull or tug on the PICC.  Avoid blood pressure checks on the arm with the PICC.  Keep your PICC identification card with you at all  times.  Do not take the PICC out yourself. Only a trained clinical professional should remove the PICC. SEEK IMMEDIATE MEDICAL CARE IF:  Your PICC is accidentally pulled all the way out. If this happens, cover the insertion site with a bandage or gauze dressing. Do not throw the PICC away. Your health care provider will need to inspect it.  Your PICC was tugged or pulled and has partially come out. Do not  push the PICC back in.  There is any type of drainage, redness, or swelling where the PICC enters the skin.  You cannot flush the PICC, it is difficult to flush, or the PICC leaks around the insertion site when it is flushed.  You hear a "flushing" sound when the PICC is flushed.  You have pain, discomfort, or numbness in your arm, shoulder, or jaw on the same side as the PICC.  You feel your heart "racing" or skipping beats.  You notice a hole or tear in the PICC.  You develop chills or a fever. MAKE SURE YOU:   Understand these instructions.  Will watch your condition.  Will get help right away if you are not doing well or get worse. Document Released: 10/29/2002 Document Revised: 09/08/2013 Document Reviewed: 12/30/2012 ExitCare Patient Information 2015 [employer]. This information is not intended to replace advice given to you by your health care provider. Make sure you discuss any questions you have with your health care provider.  

## 2014-06-11 ENCOUNTER — Ambulatory Visit (HOSPITAL_BASED_OUTPATIENT_CLINIC_OR_DEPARTMENT_OTHER): Payer: Medicare Other

## 2014-06-11 ENCOUNTER — Other Ambulatory Visit (HOSPITAL_BASED_OUTPATIENT_CLINIC_OR_DEPARTMENT_OTHER): Payer: Medicare Other

## 2014-06-11 ENCOUNTER — Encounter: Payer: Self-pay | Admitting: *Deleted

## 2014-06-11 ENCOUNTER — Ambulatory Visit: Payer: Medicare Other

## 2014-06-11 VITALS — BP 126/71 | HR 64 | Temp 98.0°F

## 2014-06-11 DIAGNOSIS — C9200 Acute myeloblastic leukemia, not having achieved remission: Secondary | ICD-10-CM

## 2014-06-11 DIAGNOSIS — D72819 Decreased white blood cell count, unspecified: Secondary | ICD-10-CM

## 2014-06-11 DIAGNOSIS — Z452 Encounter for adjustment and management of vascular access device: Secondary | ICD-10-CM

## 2014-06-11 LAB — COMPREHENSIVE METABOLIC PANEL (CC13)
ALT: <6 U/L (ref 0–55)
AST: 6 U/L (ref 5–34)
Albumin: 2.6 g/dL — ABNORMAL LOW (ref 3.5–5.0)
Alkaline Phosphatase: 76 U/L (ref 40–150)
Anion Gap: 8 mEq/L (ref 3–11)
BUN: 11.9 mg/dL (ref 7.0–26.0)
CO2: 29 mEq/L (ref 22–29)
Calcium: 8.2 mg/dL — ABNORMAL LOW (ref 8.4–10.4)
Chloride: 105 mEq/L (ref 98–109)
Creatinine: 0.9 mg/dL (ref 0.7–1.3)
EGFR: 84 mL/min/{1.73_m2} — ABNORMAL LOW (ref >90)
Glucose: 94 mg/dl (ref 70–140)
Potassium: 3.4 mEq/L — ABNORMAL LOW (ref 3.5–5.1)
Sodium: 141 mEq/L (ref 136–145)
Total Bilirubin: 0.66 mg/dL (ref 0.20–1.20)
Total Protein: 5.6 g/dL — ABNORMAL LOW (ref 6.4–8.3)

## 2014-06-11 LAB — CBC WITH DIFFERENTIAL/PLATELET
BASO%: 1.1 % (ref 0.0–2.0)
Basophils Absolute: 0 10*3/uL (ref 0.0–0.1)
EOS%: 2.3 % (ref 0.0–7.0)
Eosinophils Absolute: 0 10*3/uL (ref 0.0–0.5)
HCT: 26.2 % — ABNORMAL LOW (ref 38.4–49.9)
HGB: 8.6 g/dL — ABNORMAL LOW (ref 13.0–17.1)
LYMPH%: 67.8 % — ABNORMAL HIGH (ref 14.0–49.0)
MCH: 32.5 pg (ref 27.2–33.4)
MCHC: 32.8 g/dL (ref 32.0–36.0)
MCV: 98.9 fL — ABNORMAL HIGH (ref 79.3–98.0)
MONO#: 0 10*3/uL — ABNORMAL LOW (ref 0.1–0.9)
MONO%: 2.3 % (ref 0.0–14.0)
NEUT#: 0.2 10*3/uL — CL (ref 1.5–6.5)
NEUT%: 26.5 % — ABNORMAL LOW (ref 39.0–75.0)
Platelets: 69 10*3/uL — ABNORMAL LOW (ref 140–400)
RBC: 2.65 10*6/uL — ABNORMAL LOW (ref 4.20–5.82)
RDW: 21 % — ABNORMAL HIGH (ref 11.0–14.6)
WBC: 0.9 10*3/uL — CL (ref 4.0–10.3)
lymph#: 0.6 10*3/uL — ABNORMAL LOW (ref 0.9–3.3)
nRBC: 0 % (ref 0–0)

## 2014-06-11 LAB — HOLD TUBE, BLOOD BANK

## 2014-06-11 LAB — MAGNESIUM (CC13): Magnesium: 2 mg/dl (ref 1.5–2.5)

## 2014-06-11 MED ORDER — SODIUM CHLORIDE 0.9 % IJ SOLN
10.0000 mL | INTRAMUSCULAR | Status: DC | PRN
  Administered 2014-06-11: 10 mL via INTRAVENOUS
  Filled 2014-06-11: qty 10

## 2014-06-11 MED ORDER — HEPARIN SOD (PORK) LOCK FLUSH 100 UNIT/ML IV SOLN
500.0000 [IU] | Freq: Once | INTRAVENOUS | Status: AC
  Administered 2014-06-11: 250 [IU] via INTRAVENOUS
  Filled 2014-06-11: qty 5

## 2014-06-11 NOTE — Patient Instructions (Signed)
PICC Home Guide A peripherally inserted central catheter (PICC) is a long, thin, flexible tube that is inserted into a vein in the upper arm. It is a form of intravenous (IV) access. It is considered to be a "central" line because the tip of the PICC ends in a large vein in your chest. This large vein is called the superior vena cava (SVC). The PICC tip ends in the SVC because there is a lot of blood flow in the SVC. This allows medicines and IV fluids to be quickly distributed throughout the body. The PICC is inserted using a sterile technique by a specially trained nurse or physician. After the PICC is inserted, a chest X-ray exam is done to be sure it is in the correct place.  A PICC may be placed for different reasons, such as:  To give medicines and liquid nutrition that can only be given through a central line. Examples are:  Certain antibiotic treatments.  Chemotherapy.  Total parenteral nutrition (TPN).  To take frequent blood samples.  To give IV fluids and blood products.  If there is difficulty placing a peripheral intravenous (PIV) catheter. If taken care of properly, a PICC can remain in place for several months. A PICC can also allow a person to go home from the hospital early. Medicine and PICC care can be managed at home by a family member or home health care team. WHAT PROBLEMS CAN HAPPEN WHEN I HAVE A PICC? Problems with a PICC can occasionally occur. These may include the following:  A blood clot (thrombus) forming in or at the tip of the PICC. This can cause the PICC to become clogged. A clot-dissolving medicine called tissue plasminogen activator (tPA) can be given through the PICC to help break up the clot.  Inflammation of the vein (phlebitis) in which the PICC is placed. Signs of inflammation may include redness, pain at the insertion site, red streaks, or being able to feel a "cord" in the vein where the PICC is located.  Infection in the PICC or at the insertion  site. Signs of infection may include fever, chills, redness, swelling, or pus drainage from the PICC insertion site.  PICC movement (malposition). The PICC tip may move from its original position due to excessive physical activity, forceful coughing, sneezing, or vomiting.  A break or cut in the PICC. It is important to not use scissors near the PICC.  Nerve or tendon irritation or injury during PICC insertion. WHAT SHOULD I KEEP IN MIND ABOUT ACTIVITIES WHEN I HAVE A PICC?  You may bend your arm and move it freely. If your PICC is near or at the bend of your elbow, avoid activity with repeated motion at the elbow.  Rest at home for the remainder of the day following PICC line insertion.  Avoid lifting heavy objects as instructed by your health care provider.  Avoid using a crutch with the arm on the same side as your PICC. You may need to use a walker. WHAT SHOULD I KNOW ABOUT MY PICC DRESSING?  Keep your PICC bandage (dressing) clean and dry to prevent infection.  Ask your health care provider when you may shower. Ask your health care provider to teach you how to wrap the PICC when you do take a shower.  Change the PICC dressing as instructed by your health care provider.  Change your PICC dressing if it becomes loose or wet. WHAT SHOULD I KNOW ABOUT PICC CARE?  Check the PICC insertion site   daily for leakage, redness, swelling, or pain.  Do not take a bath, swim, or use hot tubs when you have a PICC. Cover PICC line with clear plastic wrap and tape to keep it dry while showering.  Flush the PICC as directed by your health care provider. Let your health care provider know right away if the PICC is difficult to flush or does not flush. Do not use force to flush the PICC.  Do not use a syringe that is less than 10 mL to flush the PICC.  Never pull or tug on the PICC.  Avoid blood pressure checks on the arm with the PICC.  Keep your PICC identification card with you at all  times.  Do not take the PICC out yourself. Only a trained clinical professional should remove the PICC. SEEK IMMEDIATE MEDICAL CARE IF:  Your PICC is accidentally pulled all the way out. If this happens, cover the insertion site with a bandage or gauze dressing. Do not throw the PICC away. Your health care provider will need to inspect it.  Your PICC was tugged or pulled and has partially come out. Do not  push the PICC back in.  There is any type of drainage, redness, or swelling where the PICC enters the skin.  You cannot flush the PICC, it is difficult to flush, or the PICC leaks around the insertion site when it is flushed.  You hear a "flushing" sound when the PICC is flushed.  You have pain, discomfort, or numbness in your arm, shoulder, or jaw on the same side as the PICC.  You feel your heart "racing" or skipping beats.  You notice a hole or tear in the PICC.  You develop chills or a fever. MAKE SURE YOU:   Understand these instructions.  Will watch your condition.  Will get help right away if you are not doing well or get worse. Document Released: 10/29/2002 Document Revised: 09/08/2013 Document Reviewed: 12/30/2012 ExitCare Patient Information 2015 [employer]. This information is not intended to replace advice given to you by your health care provider. Make sure you discuss any questions you have with your health care provider.  

## 2014-06-11 NOTE — Progress Notes (Signed)
This RN faxed today's labs to [facility]. Fax # is [phone].

## 2014-06-11 NOTE — Progress Notes (Signed)
Recent chest xray given to Dr. Alen Blew for review of PICC placement. Per MD, OK to use PICC.   Patient in lobby wearing mask. Labs reviewed, Hgb 8.6. Per Dr. Alen Blew, no transfusion required today. Patient informed and infection precautions reviewed. Patient instructed to call with fever, shaking, or chills, or any changes from baseline. He and wife verbalize understanding.

## 2014-06-18 ENCOUNTER — Telehealth: Payer: Self-pay | Admitting: Oncology

## 2014-06-18 ENCOUNTER — Telehealth: Payer: Self-pay | Admitting: *Deleted

## 2014-06-18 ENCOUNTER — Other Ambulatory Visit: Payer: Self-pay | Admitting: *Deleted

## 2014-06-18 DIAGNOSIS — C9200 Acute myeloblastic leukemia, not having achieved remission: Secondary | ICD-10-CM

## 2014-06-18 NOTE — Telephone Encounter (Signed)
Confirm appointment for 02/15. Patient will get calendar from [facility].

## 2014-06-18 NOTE — Telephone Encounter (Signed)
Per staff message and POF I have scheduled appts. Advised scheduler of appts and no available on 2/16 or 2/23   JMW

## 2014-06-22 ENCOUNTER — Other Ambulatory Visit (HOSPITAL_BASED_OUTPATIENT_CLINIC_OR_DEPARTMENT_OTHER): Payer: Medicare Other

## 2014-06-22 ENCOUNTER — Ambulatory Visit (HOSPITAL_BASED_OUTPATIENT_CLINIC_OR_DEPARTMENT_OTHER): Payer: Medicare Other

## 2014-06-22 VITALS — BP 139/69 | HR 66 | Temp 98.0°F

## 2014-06-22 DIAGNOSIS — C9200 Acute myeloblastic leukemia, not having achieved remission: Secondary | ICD-10-CM

## 2014-06-22 DIAGNOSIS — Z452 Encounter for adjustment and management of vascular access device: Secondary | ICD-10-CM

## 2014-06-22 LAB — COMPREHENSIVE METABOLIC PANEL (CC13)
ALT: <6 U/L (ref 0–55)
AST: 8 U/L (ref 5–34)
Albumin: 2.9 g/dL — ABNORMAL LOW (ref 3.5–5.0)
Alkaline Phosphatase: 65 U/L (ref 40–150)
Anion Gap: 12 mEq/L — ABNORMAL HIGH (ref 3–11)
BUN: 13.3 mg/dL (ref 7.0–26.0)
CALCIUM: 8.2 mg/dL — AB (ref 8.4–10.4)
CO2: 24 mEq/L (ref 22–29)
Chloride: 107 mEq/L (ref 98–109)
Creatinine: 0.9 mg/dL (ref 0.7–1.3)
EGFR: 81 mL/min/{1.73_m2} — ABNORMAL LOW (ref >90)
GLUCOSE: 112 mg/dL (ref 70–140)
Potassium: 3.5 mEq/L (ref 3.5–5.1)
Sodium: 144 mEq/L (ref 136–145)
Total Bilirubin: 0.79 mg/dL (ref 0.20–1.20)
Total Protein: 5.7 g/dL — ABNORMAL LOW (ref 6.4–8.3)

## 2014-06-22 LAB — CBC WITH DIFFERENTIAL/PLATELET
BASO%: 0.4 % (ref 0.0–2.0)
BASOS ABS: 0 10*3/uL (ref 0.0–0.1)
EOS%: 0.9 % (ref 0.0–7.0)
Eosinophils Absolute: 0 10*3/uL (ref 0.0–0.5)
HCT: 30.3 % — ABNORMAL LOW (ref 38.4–49.9)
HGB: 9.8 g/dL — ABNORMAL LOW (ref 13.0–17.1)
LYMPH%: 26.3 % (ref 14.0–49.0)
MCH: 32.8 pg (ref 27.2–33.4)
MCHC: 32.3 g/dL (ref 32.0–36.0)
MCV: 101.3 fL — AB (ref 79.3–98.0)
MONO#: 0.1 10*3/uL (ref 0.1–0.9)
MONO%: 3 % (ref 0.0–14.0)
NEUT#: 1.6 10*3/uL (ref 1.5–6.5)
NEUT%: 69.4 % (ref 39.0–75.0)
Platelets: 114 10*3/uL — ABNORMAL LOW (ref 140–400)
RBC: 2.99 10*6/uL — ABNORMAL LOW (ref 4.20–5.82)
RDW: 20.5 % — AB (ref 11.0–14.6)
WBC: 2.3 10*3/uL — ABNORMAL LOW (ref 4.0–10.3)
lymph#: 0.6 10*3/uL — ABNORMAL LOW (ref 0.9–3.3)

## 2014-06-22 LAB — HOLD TUBE, BLOOD BANK

## 2014-06-22 LAB — MAGNESIUM (CC13): MAGNESIUM: 2.2 mg/dL (ref 1.5–2.5)

## 2014-06-22 MED ORDER — SODIUM CHLORIDE 0.9 % IJ SOLN
10.0000 mL | INTRAMUSCULAR | Status: DC | PRN
  Administered 2014-06-22: 10 mL via INTRAVENOUS
  Filled 2014-06-22: qty 10

## 2014-06-22 MED ORDER — HEPARIN SOD (PORK) LOCK FLUSH 100 UNIT/ML IV SOLN
500.0000 [IU] | Freq: Once | INTRAVENOUS | Status: AC
  Administered 2014-06-22: 250 [IU] via INTRAVENOUS
  Filled 2014-06-22: qty 5

## 2014-06-22 NOTE — Patient Instructions (Signed)
PICC Home Guide A peripherally inserted central catheter (PICC) is a long, thin, flexible tube that is inserted into a vein in the upper arm. It is a form of intravenous (IV) access. It is considered to be a "central" line because the tip of the PICC ends in a large vein in your chest. This large vein is called the superior vena cava (SVC). The PICC tip ends in the SVC because there is a lot of blood flow in the SVC. This allows medicines and IV fluids to be quickly distributed throughout the body. The PICC is inserted using a sterile technique by a specially trained nurse or physician. After the PICC is inserted, a chest X-ray exam is done to be sure it is in the correct place.  A PICC may be placed for different reasons, such as:  To give medicines and liquid nutrition that can only be given through a central line. Examples are:  Certain antibiotic treatments.  Chemotherapy.  Total parenteral nutrition (TPN).  To take frequent blood samples.  To give IV fluids and blood products.  If there is difficulty placing a peripheral intravenous (PIV) catheter. If taken care of properly, a PICC can remain in place for several months. A PICC can also allow a person to go home from the hospital early. Medicine and PICC care can be managed at home by a family member or home health care team. WHAT PROBLEMS CAN HAPPEN WHEN I HAVE A PICC? Problems with a PICC can occasionally occur. These may include the following:  A blood clot (thrombus) forming in or at the tip of the PICC. This can cause the PICC to become clogged. A clot-dissolving medicine called tissue plasminogen activator (tPA) can be given through the PICC to help break up the clot.  Inflammation of the vein (phlebitis) in which the PICC is placed. Signs of inflammation may include redness, pain at the insertion site, red streaks, or being able to feel a "cord" in the vein where the PICC is located.  Infection in the PICC or at the insertion  site. Signs of infection may include fever, chills, redness, swelling, or pus drainage from the PICC insertion site.  PICC movement (malposition). The PICC tip may move from its original position due to excessive physical activity, forceful coughing, sneezing, or vomiting.  A break or cut in the PICC. It is important to not use scissors near the PICC.  Nerve or tendon irritation or injury during PICC insertion. WHAT SHOULD I KEEP IN MIND ABOUT ACTIVITIES WHEN I HAVE A PICC?  You may bend your arm and move it freely. If your PICC is near or at the bend of your elbow, avoid activity with repeated motion at the elbow.  Rest at home for the remainder of the day following PICC line insertion.  Avoid lifting heavy objects as instructed by your health care provider.  Avoid using a crutch with the arm on the same side as your PICC. You may need to use a walker. WHAT SHOULD I KNOW ABOUT MY PICC DRESSING?  Keep your PICC bandage (dressing) clean and dry to prevent infection.  Ask your health care provider when you may shower. Ask your health care provider to teach you how to wrap the PICC when you do take a shower.  Change the PICC dressing as instructed by your health care provider.  Change your PICC dressing if it becomes loose or wet. WHAT SHOULD I KNOW ABOUT PICC CARE?  Check the PICC insertion site   daily for leakage, redness, swelling, or pain.  Do not take a bath, swim, or use hot tubs when you have a PICC. Cover PICC line with clear plastic wrap and tape to keep it dry while showering.  Flush the PICC as directed by your health care provider. Let your health care provider know right away if the PICC is difficult to flush or does not flush. Do not use force to flush the PICC.  Do not use a syringe that is less than 10 mL to flush the PICC.  Never pull or tug on the PICC.  Avoid blood pressure checks on the arm with the PICC.  Keep your PICC identification card with you at all  times.  Do not take the PICC out yourself. Only a trained clinical professional should remove the PICC. SEEK IMMEDIATE MEDICAL CARE IF:  Your PICC is accidentally pulled all the way out. If this happens, cover the insertion site with a bandage or gauze dressing. Do not throw the PICC away. Your health care provider will need to inspect it.  Your PICC was tugged or pulled and has partially come out. Do not  push the PICC back in.  There is any type of drainage, redness, or swelling where the PICC enters the skin.  You cannot flush the PICC, it is difficult to flush, or the PICC leaks around the insertion site when it is flushed.  You hear a "flushing" sound when the PICC is flushed.  You have pain, discomfort, or numbness in your arm, shoulder, or jaw on the same side as the PICC.  You feel your heart "racing" or skipping beats.  You notice a hole or tear in the PICC.  You develop chills or a fever. MAKE SURE YOU:   Understand these instructions.  Will watch your condition.  Will get help right away if you are not doing well or get worse. Document Released: 10/29/2002 Document Revised: 09/08/2013 Document Reviewed: 12/30/2012 ExitCare Patient Information 2015 [employer]. This information is not intended to replace advice given to you by your health care provider. Make sure you discuss any questions you have with your health care provider.  

## 2014-06-25 ENCOUNTER — Ambulatory Visit (HOSPITAL_BASED_OUTPATIENT_CLINIC_OR_DEPARTMENT_OTHER): Payer: Medicare Other

## 2014-06-25 ENCOUNTER — Other Ambulatory Visit (HOSPITAL_BASED_OUTPATIENT_CLINIC_OR_DEPARTMENT_OTHER): Payer: Medicare Other

## 2014-06-25 VITALS — BP 144/79 | HR 54 | Temp 98.2°F

## 2014-06-25 DIAGNOSIS — D72819 Decreased white blood cell count, unspecified: Secondary | ICD-10-CM

## 2014-06-25 DIAGNOSIS — Z452 Encounter for adjustment and management of vascular access device: Secondary | ICD-10-CM

## 2014-06-25 DIAGNOSIS — C9200 Acute myeloblastic leukemia, not having achieved remission: Secondary | ICD-10-CM

## 2014-06-25 LAB — CBC WITH DIFFERENTIAL/PLATELET
BASO%: 0.4 % (ref 0.0–2.0)
Basophils Absolute: 0 10*3/uL (ref 0.0–0.1)
EOS%: 0.9 % (ref 0.0–7.0)
Eosinophils Absolute: 0 10*3/uL (ref 0.0–0.5)
HEMATOCRIT: 27.8 % — AB (ref 38.4–49.9)
HGB: 9.3 g/dL — ABNORMAL LOW (ref 13.0–17.1)
LYMPH%: 38.2 % (ref 14.0–49.0)
MCH: 33.7 pg — AB (ref 27.2–33.4)
MCHC: 33.5 g/dL (ref 32.0–36.0)
MCV: 100.7 fL — ABNORMAL HIGH (ref 79.3–98.0)
MONO#: 0 10*3/uL — AB (ref 0.1–0.9)
MONO%: 1.7 % (ref 0.0–14.0)
NEUT#: 1.4 10*3/uL — ABNORMAL LOW (ref 1.5–6.5)
NEUT%: 58.8 % (ref 39.0–75.0)
PLATELETS: 73 10*3/uL — AB (ref 140–400)
RBC: 2.76 10*6/uL — AB (ref 4.20–5.82)
RDW: 20 % — ABNORMAL HIGH (ref 11.0–14.6)
WBC: 2.3 10*3/uL — AB (ref 4.0–10.3)
lymph#: 0.9 10*3/uL (ref 0.9–3.3)

## 2014-06-25 LAB — HOLD TUBE, BLOOD BANK

## 2014-06-25 MED ORDER — SODIUM CHLORIDE 0.9 % IJ SOLN
10.0000 mL | INTRAMUSCULAR | Status: DC | PRN
  Administered 2014-06-25: 10 mL via INTRAVENOUS
  Filled 2014-06-25: qty 10

## 2014-06-25 MED ORDER — HEPARIN SOD (PORK) LOCK FLUSH 100 UNIT/ML IV SOLN
500.0000 [IU] | Freq: Once | INTRAVENOUS | Status: AC
  Administered 2014-06-25: 250 [IU] via INTRAVENOUS
  Filled 2014-06-25: qty 5

## 2014-06-25 NOTE — Patient Instructions (Signed)
PICC Home Guide A peripherally inserted central catheter (PICC) is a long, thin, flexible tube that is inserted into a vein in the upper arm. It is a form of intravenous (IV) access. It is considered to be a "central" line because the tip of the PICC ends in a large vein in your chest. This large vein is called the superior vena cava (SVC). The PICC tip ends in the SVC because there is a lot of blood flow in the SVC. This allows medicines and IV fluids to be quickly distributed throughout the body. The PICC is inserted using a sterile technique by a specially trained nurse or physician. After the PICC is inserted, a chest X-ray exam is done to be sure it is in the correct place.  A PICC may be placed for different reasons, such as:  To give medicines and liquid nutrition that can only be given through a central line. Examples are:  Certain antibiotic treatments.  Chemotherapy.  Total parenteral nutrition (TPN).  To take frequent blood samples.  To give IV fluids and blood products.  If there is difficulty placing a peripheral intravenous (PIV) catheter. If taken care of properly, a PICC can remain in place for several months. A PICC can also allow a person to go home from the hospital early. Medicine and PICC care can be managed at home by a family member or home health care team. WHAT PROBLEMS CAN HAPPEN WHEN I HAVE A PICC? Problems with a PICC can occasionally occur. These may include the following:  A blood clot (thrombus) forming in or at the tip of the PICC. This can cause the PICC to become clogged. A clot-dissolving medicine called tissue plasminogen activator (tPA) can be given through the PICC to help break up the clot.  Inflammation of the vein (phlebitis) in which the PICC is placed. Signs of inflammation may include redness, pain at the insertion site, red streaks, or being able to feel a "cord" in the vein where the PICC is located.  Infection in the PICC or at the insertion  site. Signs of infection may include fever, chills, redness, swelling, or pus drainage from the PICC insertion site.  PICC movement (malposition). The PICC tip may move from its original position due to excessive physical activity, forceful coughing, sneezing, or vomiting.  A break or cut in the PICC. It is important to not use scissors near the PICC.  Nerve or tendon irritation or injury during PICC insertion. WHAT SHOULD I KEEP IN MIND ABOUT ACTIVITIES WHEN I HAVE A PICC?  You may bend your arm and move it freely. If your PICC is near or at the bend of your elbow, avoid activity with repeated motion at the elbow.  Rest at home for the remainder of the day following PICC line insertion.  Avoid lifting heavy objects as instructed by your health care provider.  Avoid using a crutch with the arm on the same side as your PICC. You may need to use a walker. WHAT SHOULD I KNOW ABOUT MY PICC DRESSING?  Keep your PICC bandage (dressing) clean and dry to prevent infection.  Ask your health care provider when you may shower. Ask your health care provider to teach you how to wrap the PICC when you do take a shower.  Change the PICC dressing as instructed by your health care provider.  Change your PICC dressing if it becomes loose or wet. WHAT SHOULD I KNOW ABOUT PICC CARE?  Check the PICC insertion site   daily for leakage, redness, swelling, or pain.  Do not take a bath, swim, or use hot tubs when you have a PICC. Cover PICC line with clear plastic wrap and tape to keep it dry while showering.  Flush the PICC as directed by your health care provider. Let your health care provider know right away if the PICC is difficult to flush or does not flush. Do not use force to flush the PICC.  Do not use a syringe that is less than 10 mL to flush the PICC.  Never pull or tug on the PICC.  Avoid blood pressure checks on the arm with the PICC.  Keep your PICC identification card with you at all  times.  Do not take the PICC out yourself. Only a trained clinical professional should remove the PICC. SEEK IMMEDIATE MEDICAL CARE IF:  Your PICC is accidentally pulled all the way out. If this happens, cover the insertion site with a bandage or gauze dressing. Do not throw the PICC away. Your health care provider will need to inspect it.  Your PICC was tugged or pulled and has partially come out. Do not  push the PICC back in.  There is any type of drainage, redness, or swelling where the PICC enters the skin.  You cannot flush the PICC, it is difficult to flush, or the PICC leaks around the insertion site when it is flushed.  You hear a "flushing" sound when the PICC is flushed.  You have pain, discomfort, or numbness in your arm, shoulder, or jaw on the same side as the PICC.  You feel your heart "racing" or skipping beats.  You notice a hole or tear in the PICC.  You develop chills or a fever. MAKE SURE YOU:   Understand these instructions.  Will watch your condition.  Will get help right away if you are not doing well or get worse. Document Released: 10/29/2002 Document Revised: 09/08/2013 Document Reviewed: 12/30/2012 ExitCare Patient Information 2015 [employer]. This information is not intended to replace advice given to you by your health care provider. Make sure you discuss any questions you have with your health care provider.  

## 2014-06-29 ENCOUNTER — Ambulatory Visit (HOSPITAL_BASED_OUTPATIENT_CLINIC_OR_DEPARTMENT_OTHER): Payer: Medicare Other

## 2014-06-29 ENCOUNTER — Other Ambulatory Visit (HOSPITAL_BASED_OUTPATIENT_CLINIC_OR_DEPARTMENT_OTHER): Payer: Medicare Other

## 2014-06-29 VITALS — BP 154/89 | HR 70 | Temp 97.7°F

## 2014-06-29 DIAGNOSIS — C9200 Acute myeloblastic leukemia, not having achieved remission: Secondary | ICD-10-CM

## 2014-06-29 DIAGNOSIS — Z452 Encounter for adjustment and management of vascular access device: Secondary | ICD-10-CM

## 2014-06-29 LAB — CBC WITH DIFFERENTIAL/PLATELET
BASO%: 0.6 % (ref 0.0–2.0)
BASOS ABS: 0 10*3/uL (ref 0.0–0.1)
EOS%: 0.9 % (ref 0.0–7.0)
Eosinophils Absolute: 0 10*3/uL (ref 0.0–0.5)
HCT: 25.7 % — ABNORMAL LOW (ref 38.4–49.9)
HGB: 8.5 g/dL — ABNORMAL LOW (ref 13.0–17.1)
LYMPH%: 52 % — ABNORMAL HIGH (ref 14.0–49.0)
MCH: 33.1 pg (ref 27.2–33.4)
MCHC: 33.2 g/dL (ref 32.0–36.0)
MCV: 99.8 fL — AB (ref 79.3–98.0)
MONO#: 0.1 10*3/uL (ref 0.1–0.9)
MONO%: 3.2 % (ref 0.0–14.0)
NEUT#: 0.7 10*3/uL — ABNORMAL LOW (ref 1.5–6.5)
NEUT%: 43.3 % (ref 39.0–75.0)
Platelets: 29 10*3/uL — ABNORMAL LOW (ref 140–400)
RBC: 2.58 10*6/uL — ABNORMAL LOW (ref 4.20–5.82)
RDW: 21.9 % — ABNORMAL HIGH (ref 11.0–14.6)
WBC: 1.6 10*3/uL — ABNORMAL LOW (ref 4.0–10.3)
lymph#: 0.8 10*3/uL — ABNORMAL LOW (ref 0.9–3.3)

## 2014-06-29 LAB — COMPREHENSIVE METABOLIC PANEL (CC13)
ALK PHOS: 60 U/L (ref 40–150)
ALT: <6 U/L (ref 0–55)
ANION GAP: 8 meq/L (ref 3–11)
AST: 9 U/L (ref 5–34)
Albumin: 2.9 g/dL — ABNORMAL LOW (ref 3.5–5.0)
BUN: 11.4 mg/dL (ref 7.0–26.0)
CALCIUM: 8.3 mg/dL — AB (ref 8.4–10.4)
CHLORIDE: 108 meq/L (ref 98–109)
CO2: 27 mEq/L (ref 22–29)
Creatinine: 0.8 mg/dL (ref 0.7–1.3)
EGFR: 90 mL/min/{1.73_m2} — ABNORMAL LOW (ref >90)
Glucose: 92 mg/dl (ref 70–140)
POTASSIUM: 3.2 meq/L — AB (ref 3.5–5.1)
SODIUM: 143 meq/L (ref 136–145)
TOTAL PROTEIN: 5.8 g/dL — AB (ref 6.4–8.3)
Total Bilirubin: 0.92 mg/dL (ref 0.20–1.20)

## 2014-06-29 LAB — MAGNESIUM (CC13): MAGNESIUM: 1.9 mg/dL (ref 1.5–2.5)

## 2014-06-29 LAB — HOLD TUBE, BLOOD BANK

## 2014-06-29 MED ORDER — SODIUM CHLORIDE 0.9 % IJ SOLN
10.0000 mL | INTRAMUSCULAR | Status: DC | PRN
  Administered 2014-06-29: 10 mL via INTRAVENOUS
  Filled 2014-06-29: qty 10

## 2014-06-29 MED ORDER — HEPARIN SOD (PORK) LOCK FLUSH 100 UNIT/ML IV SOLN
500.0000 [IU] | Freq: Once | INTRAVENOUS | Status: AC
  Administered 2014-06-29: 500 [IU] via INTRAVENOUS
  Filled 2014-06-29: qty 5

## 2014-06-29 NOTE — Patient Instructions (Signed)
PICC Home Guide A peripherally inserted central catheter (PICC) is a long, thin, flexible tube that is inserted into a vein in the upper arm. It is a form of intravenous (IV) access. It is considered to be a "central" line because the tip of the PICC ends in a large vein in your chest. This large vein is called the superior vena cava (SVC). The PICC tip ends in the SVC because there is a lot of blood flow in the SVC. This allows medicines and IV fluids to be quickly distributed throughout the body. The PICC is inserted using a sterile technique by a specially trained nurse or physician. After the PICC is inserted, a chest X-ray exam is done to be sure it is in the correct place.  A PICC may be placed for different reasons, such as:  To give medicines and liquid nutrition that can only be given through a central line. Examples are:  Certain antibiotic treatments.  Chemotherapy.  Total parenteral nutrition (TPN).  To take frequent blood samples.  To give IV fluids and blood products.  If there is difficulty placing a peripheral intravenous (PIV) catheter. If taken care of properly, a PICC can remain in place for several months. A PICC can also allow a person to go home from the hospital early. Medicine and PICC care can be managed at home by a family member or home health care team. WHAT PROBLEMS CAN HAPPEN WHEN I HAVE A PICC? Problems with a PICC can occasionally occur. These may include the following:  A blood clot (thrombus) forming in or at the tip of the PICC. This can cause the PICC to become clogged. A clot-dissolving medicine called tissue plasminogen activator (tPA) can be given through the PICC to help break up the clot.  Inflammation of the vein (phlebitis) in which the PICC is placed. Signs of inflammation may include redness, pain at the insertion site, red streaks, or being able to feel a "cord" in the vein where the PICC is located.  Infection in the PICC or at the insertion  site. Signs of infection may include fever, chills, redness, swelling, or pus drainage from the PICC insertion site.  PICC movement (malposition). The PICC tip may move from its original position due to excessive physical activity, forceful coughing, sneezing, or vomiting.  A break or cut in the PICC. It is important to not use scissors near the PICC.  Nerve or tendon irritation or injury during PICC insertion. WHAT SHOULD I KEEP IN MIND ABOUT ACTIVITIES WHEN I HAVE A PICC?  You may bend your arm and move it freely. If your PICC is near or at the bend of your elbow, avoid activity with repeated motion at the elbow.  Rest at home for the remainder of the day following PICC line insertion.  Avoid lifting heavy objects as instructed by your health care provider.  Avoid using a crutch with the arm on the same side as your PICC. You may need to use a walker. WHAT SHOULD I KNOW ABOUT MY PICC DRESSING?  Keep your PICC bandage (dressing) clean and dry to prevent infection.  Ask your health care provider when you may shower. Ask your health care provider to teach you how to wrap the PICC when you do take a shower.  Change the PICC dressing as instructed by your health care provider.  Change your PICC dressing if it becomes loose or wet. WHAT SHOULD I KNOW ABOUT PICC CARE?  Check the PICC insertion site   daily for leakage, redness, swelling, or pain.  Do not take a bath, swim, or use hot tubs when you have a PICC. Cover PICC line with clear plastic wrap and tape to keep it dry while showering.  Flush the PICC as directed by your health care provider. Let your health care provider know right away if the PICC is difficult to flush or does not flush. Do not use force to flush the PICC.  Do not use a syringe that is less than 10 mL to flush the PICC.  Never pull or tug on the PICC.  Avoid blood pressure checks on the arm with the PICC.  Keep your PICC identification card with you at all  times.  Do not take the PICC out yourself. Only a trained clinical professional should remove the PICC. SEEK IMMEDIATE MEDICAL CARE IF:  Your PICC is accidentally pulled all the way out. If this happens, cover the insertion site with a bandage or gauze dressing. Do not throw the PICC away. Your health care provider will need to inspect it.  Your PICC was tugged or pulled and has partially come out. Do not  push the PICC back in.  There is any type of drainage, redness, or swelling where the PICC enters the skin.  You cannot flush the PICC, it is difficult to flush, or the PICC leaks around the insertion site when it is flushed.  You hear a "flushing" sound when the PICC is flushed.  You have pain, discomfort, or numbness in your arm, shoulder, or jaw on the same side as the PICC.  You feel your heart "racing" or skipping beats.  You notice a hole or tear in the PICC.  You develop chills or a fever. MAKE SURE YOU:   Understand these instructions.  Will watch your condition.  Will get help right away if you are not doing well or get worse. Document Released: 10/29/2002 Document Revised: 09/08/2013 Document Reviewed: 12/30/2012 ExitCare Patient Information 2015 [employer]. This information is not intended to replace advice given to you by your health care provider. Make sure you discuss any questions you have with your health care provider.  

## 2014-07-02 ENCOUNTER — Other Ambulatory Visit (HOSPITAL_BASED_OUTPATIENT_CLINIC_OR_DEPARTMENT_OTHER): Payer: Medicare Other

## 2014-07-02 ENCOUNTER — Ambulatory Visit (HOSPITAL_BASED_OUTPATIENT_CLINIC_OR_DEPARTMENT_OTHER): Payer: Medicare Other

## 2014-07-02 ENCOUNTER — Telehealth: Payer: Self-pay | Admitting: *Deleted

## 2014-07-02 ENCOUNTER — Other Ambulatory Visit: Payer: Self-pay | Admitting: *Deleted

## 2014-07-02 VITALS — BP 138/54 | HR 76 | Temp 98.0°F

## 2014-07-02 DIAGNOSIS — C9200 Acute myeloblastic leukemia, not having achieved remission: Secondary | ICD-10-CM

## 2014-07-02 DIAGNOSIS — D72819 Decreased white blood cell count, unspecified: Secondary | ICD-10-CM

## 2014-07-02 DIAGNOSIS — Z452 Encounter for adjustment and management of vascular access device: Secondary | ICD-10-CM | POA: Diagnosis not present

## 2014-07-02 LAB — CBC WITH DIFFERENTIAL/PLATELET
BASO%: 0 % (ref 0.0–2.0)
BASOS ABS: 0 10*3/uL (ref 0.0–0.1)
EOS ABS: 0 10*3/uL (ref 0.0–0.5)
EOS%: 1.4 % (ref 0.0–7.0)
HCT: 22.9 % — ABNORMAL LOW (ref 38.4–49.9)
HEMOGLOBIN: 7.7 g/dL — AB (ref 13.0–17.1)
LYMPH#: 0.4 10*3/uL — AB (ref 0.9–3.3)
LYMPH%: 61.4 % — ABNORMAL HIGH (ref 14.0–49.0)
MCH: 33.9 pg — ABNORMAL HIGH (ref 27.2–33.4)
MCHC: 33.6 g/dL (ref 32.0–36.0)
MCV: 100.9 fL — ABNORMAL HIGH (ref 79.3–98.0)
MONO#: 0 10*3/uL — ABNORMAL LOW (ref 0.1–0.9)
MONO%: 2.9 % (ref 0.0–14.0)
NEUT#: 0.2 10*3/uL — CL (ref 1.5–6.5)
NEUT%: 34.3 % — ABNORMAL LOW (ref 39.0–75.0)
Platelets: 15 10*3/uL — ABNORMAL LOW (ref 140–400)
RBC: 2.27 10*6/uL — ABNORMAL LOW (ref 4.20–5.82)
RDW: 19.7 % — AB (ref 11.0–14.6)
WBC: 0.7 10*3/uL — CL (ref 4.0–10.3)

## 2014-07-02 LAB — COMPREHENSIVE METABOLIC PANEL (CC13)
ALT: <6 U/L (ref 0–55)
AST: 7 U/L (ref 5–34)
Albumin: 2.7 g/dL — ABNORMAL LOW (ref 3.5–5.0)
Alkaline Phosphatase: 59 U/L (ref 40–150)
Anion Gap: 9 meq/L (ref 3–11)
BUN: 12.4 mg/dL (ref 7.0–26.0)
CO2: 28 meq/L (ref 22–29)
Calcium: 8.2 mg/dL — ABNORMAL LOW (ref 8.4–10.4)
Chloride: 108 meq/L (ref 98–109)
Creatinine: 0.8 mg/dL (ref 0.7–1.3)
EGFR: 89 ml/min/1.73 m2 — ABNORMAL LOW
Glucose: 99 mg/dL (ref 70–140)
Potassium: 2.9 meq/L — CL (ref 3.5–5.1)
Sodium: 144 meq/L (ref 136–145)
Total Bilirubin: 0.77 mg/dL (ref 0.20–1.20)
Total Protein: 5.6 g/dL — ABNORMAL LOW (ref 6.4–8.3)

## 2014-07-02 LAB — HOLD TUBE, BLOOD BANK

## 2014-07-02 MED ORDER — SODIUM CHLORIDE 0.9 % IJ SOLN
10.0000 mL | INTRAMUSCULAR | Status: DC | PRN
Start: 2014-07-02 — End: 2014-07-02
  Administered 2014-07-02: 10 mL via INTRAVENOUS
  Filled 2014-07-02: qty 10

## 2014-07-02 MED ORDER — HEPARIN SOD (PORK) LOCK FLUSH 100 UNIT/ML IV SOLN
500.0000 [IU] | Freq: Once | INTRAVENOUS | Status: AC
  Administered 2014-07-02: 250 [IU] via INTRAVENOUS
  Filled 2014-07-02: qty 5

## 2014-07-02 NOTE — Telephone Encounter (Signed)
Patient set up for 2 units p-rbc's and 1 unit platelet pheresis 07/03/14, labs faxed to [facility].

## 2014-07-02 NOTE — Telephone Encounter (Signed)
Spoke with wife nonnie, patient's potassium is very low. States he has potassium at home, but has not been taking it. Instructed to resume taking it daily. Wife verbalizes understanding.

## 2014-07-02 NOTE — Patient Instructions (Signed)
PICC Home Guide A peripherally inserted central catheter (PICC) is a long, thin, flexible tube that is inserted into a vein in the upper arm. It is a form of intravenous (IV) access. It is considered to be a "central" line because the tip of the PICC ends in a large vein in your chest. This large vein is called the superior vena cava (SVC). The PICC tip ends in the SVC because there is a lot of blood flow in the SVC. This allows medicines and IV fluids to be quickly distributed throughout the body. The PICC is inserted using a sterile technique by a specially trained nurse or physician. After the PICC is inserted, a chest X-ray exam is done to be sure it is in the correct place.  A PICC may be placed for different reasons, such as:  To give medicines and liquid nutrition that can only be given through a central line. Examples are:  Certain antibiotic treatments.  Chemotherapy.  Total parenteral nutrition (TPN).  To take frequent blood samples.  To give IV fluids and blood products.  If there is difficulty placing a peripheral intravenous (PIV) catheter. If taken care of properly, a PICC can remain in place for several months. A PICC can also allow a person to go home from the hospital early. Medicine and PICC care can be managed at home by a family member or home health care team. WHAT PROBLEMS CAN HAPPEN WHEN I HAVE A PICC? Problems with a PICC can occasionally occur. These may include the following:  A blood clot (thrombus) forming in or at the tip of the PICC. This can cause the PICC to become clogged. A clot-dissolving medicine called tissue plasminogen activator (tPA) can be given through the PICC to help break up the clot.  Inflammation of the vein (phlebitis) in which the PICC is placed. Signs of inflammation may include redness, pain at the insertion site, red streaks, or being able to feel a "cord" in the vein where the PICC is located.  Infection in the PICC or at the insertion  site. Signs of infection may include fever, chills, redness, swelling, or pus drainage from the PICC insertion site.  PICC movement (malposition). The PICC tip may move from its original position due to excessive physical activity, forceful coughing, sneezing, or vomiting.  A break or cut in the PICC. It is important to not use scissors near the PICC.  Nerve or tendon irritation or injury during PICC insertion. WHAT SHOULD I KEEP IN MIND ABOUT ACTIVITIES WHEN I HAVE A PICC?  You may bend your arm and move it freely. If your PICC is near or at the bend of your elbow, avoid activity with repeated motion at the elbow.  Rest at home for the remainder of the day following PICC line insertion.  Avoid lifting heavy objects as instructed by your health care provider.  Avoid using a crutch with the arm on the same side as your PICC. You may need to use a walker. WHAT SHOULD I KNOW ABOUT MY PICC DRESSING?  Keep your PICC bandage (dressing) clean and dry to prevent infection.  Ask your health care provider when you may shower. Ask your health care provider to teach you how to wrap the PICC when you do take a shower.  Change the PICC dressing as instructed by your health care provider.  Change your PICC dressing if it becomes loose or wet. WHAT SHOULD I KNOW ABOUT PICC CARE?  Check the PICC insertion site   daily for leakage, redness, swelling, or pain.  Do not take a bath, swim, or use hot tubs when you have a PICC. Cover PICC line with clear plastic wrap and tape to keep it dry while showering.  Flush the PICC as directed by your health care provider. Let your health care provider know right away if the PICC is difficult to flush or does not flush. Do not use force to flush the PICC.  Do not use a syringe that is less than 10 mL to flush the PICC.  Never pull or tug on the PICC.  Avoid blood pressure checks on the arm with the PICC.  Keep your PICC identification card with you at all  times.  Do not take the PICC out yourself. Only a trained clinical professional should remove the PICC. SEEK IMMEDIATE MEDICAL CARE IF:  Your PICC is accidentally pulled all the way out. If this happens, cover the insertion site with a bandage or gauze dressing. Do not throw the PICC away. Your health care provider will need to inspect it.  Your PICC was tugged or pulled and has partially come out. Do not  push the PICC back in.  There is any type of drainage, redness, or swelling where the PICC enters the skin.  You cannot flush the PICC, it is difficult to flush, or the PICC leaks around the insertion site when it is flushed.  You hear a "flushing" sound when the PICC is flushed.  You have pain, discomfort, or numbness in your arm, shoulder, or jaw on the same side as the PICC.  You feel your heart "racing" or skipping beats.  You notice a hole or tear in the PICC.  You develop chills or a fever. MAKE SURE YOU:   Understand these instructions.  Will watch your condition.  Will get help right away if you are not doing well or get worse. Document Released: 10/29/2002 Document Revised: 09/08/2013 Document Reviewed: 12/30/2012 ExitCare Patient Information 2015 [employer]. This information is not intended to replace advice given to you by your health care provider. Make sure you discuss any questions you have with your health care provider.  

## 2014-07-03 ENCOUNTER — Other Ambulatory Visit: Payer: Self-pay | Admitting: Oncology

## 2014-07-03 ENCOUNTER — Ambulatory Visit (HOSPITAL_BASED_OUTPATIENT_CLINIC_OR_DEPARTMENT_OTHER): Payer: Medicare Other

## 2014-07-03 VITALS — BP 152/84 | HR 70 | Temp 98.5°F | Resp 16

## 2014-07-03 DIAGNOSIS — C9200 Acute myeloblastic leukemia, not having achieved remission: Secondary | ICD-10-CM

## 2014-07-03 DIAGNOSIS — D72819 Decreased white blood cell count, unspecified: Secondary | ICD-10-CM

## 2014-07-03 MED ORDER — DIPHENHYDRAMINE HCL 25 MG PO CAPS
25.0000 mg | ORAL_CAPSULE | Freq: Once | ORAL | Status: AC
  Administered 2014-07-03: 25 mg via ORAL

## 2014-07-03 MED ORDER — SODIUM CHLORIDE 0.9 % IV SOLN
250.0000 mL | Freq: Once | INTRAVENOUS | Status: AC
  Administered 2014-07-03: 250 mL via INTRAVENOUS

## 2014-07-03 MED ORDER — FUROSEMIDE 10 MG/ML IJ SOLN
INTRAMUSCULAR | Status: AC
  Filled 2014-07-03: qty 2

## 2014-07-03 MED ORDER — HEPARIN SOD (PORK) LOCK FLUSH 100 UNIT/ML IV SOLN
250.0000 [IU] | INTRAVENOUS | Status: DC | PRN
  Filled 2014-07-03: qty 5

## 2014-07-03 MED ORDER — ACETAMINOPHEN 325 MG PO TABS
ORAL_TABLET | ORAL | Status: AC
  Filled 2014-07-03: qty 2

## 2014-07-03 MED ORDER — FUROSEMIDE 10 MG/ML IJ SOLN
20.0000 mg | Freq: Once | INTRAMUSCULAR | Status: AC
  Administered 2014-07-03: 20 mg via INTRAVENOUS

## 2014-07-03 MED ORDER — SODIUM CHLORIDE 0.9 % IJ SOLN
3.0000 mL | INTRAMUSCULAR | Status: DC | PRN
  Filled 2014-07-03: qty 10

## 2014-07-03 MED ORDER — DIPHENHYDRAMINE HCL 25 MG PO CAPS
ORAL_CAPSULE | ORAL | Status: AC
  Filled 2014-07-03: qty 1

## 2014-07-03 MED ORDER — ACETAMINOPHEN 325 MG PO TABS
650.0000 mg | ORAL_TABLET | Freq: Once | ORAL | Status: AC
  Administered 2014-07-03: 650 mg via ORAL

## 2014-07-04 LAB — TYPE AND SCREEN
ABO/RH(D): O POS
Antibody Screen: NEGATIVE
Unit division: 0
Unit division: 0

## 2014-07-04 LAB — PREPARE PLATELET PHERESIS: UNIT DIVISION: 0

## 2014-07-06 ENCOUNTER — Ambulatory Visit (HOSPITAL_BASED_OUTPATIENT_CLINIC_OR_DEPARTMENT_OTHER): Payer: Medicare Other

## 2014-07-06 ENCOUNTER — Other Ambulatory Visit (HOSPITAL_BASED_OUTPATIENT_CLINIC_OR_DEPARTMENT_OTHER): Payer: Medicare Other

## 2014-07-06 ENCOUNTER — Telehealth: Payer: Self-pay | Admitting: Oncology

## 2014-07-06 ENCOUNTER — Other Ambulatory Visit: Payer: Self-pay | Admitting: *Deleted

## 2014-07-06 ENCOUNTER — Telehealth: Payer: Self-pay | Admitting: *Deleted

## 2014-07-06 VITALS — BP 133/76 | HR 67 | Temp 97.9°F

## 2014-07-06 DIAGNOSIS — Z452 Encounter for adjustment and management of vascular access device: Secondary | ICD-10-CM

## 2014-07-06 DIAGNOSIS — C9200 Acute myeloblastic leukemia, not having achieved remission: Secondary | ICD-10-CM | POA: Diagnosis not present

## 2014-07-06 DIAGNOSIS — D72819 Decreased white blood cell count, unspecified: Secondary | ICD-10-CM

## 2014-07-06 LAB — COMPREHENSIVE METABOLIC PANEL (CC13)
ALK PHOS: 61 U/L (ref 40–150)
ALT: <6 U/L (ref 0–55)
AST: 8 U/L (ref 5–34)
Albumin: 2.6 g/dL — ABNORMAL LOW (ref 3.5–5.0)
Anion Gap: 6 mEq/L (ref 3–11)
BILIRUBIN TOTAL: 0.92 mg/dL (ref 0.20–1.20)
BUN: 12.9 mg/dL (ref 7.0–26.0)
CO2: 27 mEq/L (ref 22–29)
Calcium: 8.2 mg/dL — ABNORMAL LOW (ref 8.4–10.4)
Chloride: 106 mEq/L (ref 98–109)
Creatinine: 0.8 mg/dL (ref 0.7–1.3)
EGFR: 88 mL/min/{1.73_m2} — AB (ref >90)
Glucose: 112 mg/dl (ref 70–140)
POTASSIUM: 3.3 meq/L — AB (ref 3.5–5.1)
Sodium: 140 mEq/L (ref 136–145)
Total Protein: 5.7 g/dL — ABNORMAL LOW (ref 6.4–8.3)

## 2014-07-06 LAB — HOLD TUBE, BLOOD BANK

## 2014-07-06 LAB — CBC WITH DIFFERENTIAL/PLATELET
BASO%: 0 % (ref 0.0–2.0)
BASOS ABS: 0 10*3/uL (ref 0.0–0.1)
EOS ABS: 0 10*3/uL (ref 0.0–0.5)
EOS%: 1.5 % (ref 0.0–7.0)
HCT: 26.6 % — ABNORMAL LOW (ref 38.4–49.9)
HGB: 8.8 g/dL — ABNORMAL LOW (ref 13.0–17.1)
LYMPH#: 0.5 10*3/uL — AB (ref 0.9–3.3)
LYMPH%: 74.6 % — ABNORMAL HIGH (ref 14.0–49.0)
MCH: 32.5 pg (ref 27.2–33.4)
MCHC: 33.1 g/dL (ref 32.0–36.0)
MCV: 98.2 fL — ABNORMAL HIGH (ref 79.3–98.0)
MONO#: 0 10*3/uL — AB (ref 0.1–0.9)
MONO%: 0 % (ref 0.0–14.0)
NEUT%: 23.9 % — AB (ref 39.0–75.0)
NEUTROS ABS: 0.2 10*3/uL — AB (ref 1.5–6.5)
Platelets: 41 10*3/uL — ABNORMAL LOW (ref 140–400)
RBC: 2.71 10*6/uL — ABNORMAL LOW (ref 4.20–5.82)
RDW: 20.7 % — ABNORMAL HIGH (ref 11.0–14.6)
WBC: 0.7 10*3/uL — AB (ref 4.0–10.3)

## 2014-07-06 LAB — MAGNESIUM (CC13): Magnesium: 2.1 mg/dl (ref 1.5–2.5)

## 2014-07-06 MED ORDER — HEPARIN SOD (PORK) LOCK FLUSH 100 UNIT/ML IV SOLN
500.0000 [IU] | Freq: Once | INTRAVENOUS | Status: AC
Start: 2014-07-06 — End: 2014-07-06
  Administered 2014-07-06: 250 [IU] via INTRAVENOUS
  Filled 2014-07-06: qty 5

## 2014-07-06 MED ORDER — SODIUM CHLORIDE 0.9 % IJ SOLN
10.0000 mL | INTRAMUSCULAR | Status: DC | PRN
  Administered 2014-07-06: 10 mL via INTRAVENOUS
  Filled 2014-07-06: qty 10

## 2014-07-06 NOTE — Telephone Encounter (Signed)
Per staff message and POF I have scheduled appts. Advised scheduler of appts. JMW  

## 2014-07-06 NOTE — Telephone Encounter (Signed)
Lft msg for pt confirming 5 hour blood visit per 02/29 POF, mailed copy out to pt..... KJ

## 2014-07-06 NOTE — Patient Instructions (Signed)
PICC Home Guide A peripherally inserted central catheter (PICC) is a long, thin, flexible tube that is inserted into a vein in the upper arm. It is a form of intravenous (IV) access. It is considered to be a "central" line because the tip of the PICC ends in a large vein in your chest. This large vein is called the superior vena cava (SVC). The PICC tip ends in the SVC because there is a lot of blood flow in the SVC. This allows medicines and IV fluids to be quickly distributed throughout the body. The PICC is inserted using a sterile technique by a specially trained nurse or physician. After the PICC is inserted, a chest X-ray exam is done to be sure it is in the correct place.  A PICC may be placed for different reasons, such as:  To give medicines and liquid nutrition that can only be given through a central line. Examples are:  Certain antibiotic treatments.  Chemotherapy.  Total parenteral nutrition (TPN).  To take frequent blood samples.  To give IV fluids and blood products.  If there is difficulty placing a peripheral intravenous (PIV) catheter. If taken care of properly, a PICC can remain in place for several months. A PICC can also allow a person to go home from the hospital early. Medicine and PICC care can be managed at home by a family member or home health care team. WHAT PROBLEMS CAN HAPPEN WHEN I HAVE A PICC? Problems with a PICC can occasionally occur. These may include the following:  A blood clot (thrombus) forming in or at the tip of the PICC. This can cause the PICC to become clogged. A clot-dissolving medicine called tissue plasminogen activator (tPA) can be given through the PICC to help break up the clot.  Inflammation of the vein (phlebitis) in which the PICC is placed. Signs of inflammation may include redness, pain at the insertion site, red streaks, or being able to feel a "cord" in the vein where the PICC is located.  Infection in the PICC or at the insertion  site. Signs of infection may include fever, chills, redness, swelling, or pus drainage from the PICC insertion site.  PICC movement (malposition). The PICC tip may move from its original position due to excessive physical activity, forceful coughing, sneezing, or vomiting.  A break or cut in the PICC. It is important to not use scissors near the PICC.  Nerve or tendon irritation or injury during PICC insertion. WHAT SHOULD I KEEP IN MIND ABOUT ACTIVITIES WHEN I HAVE A PICC?  You may bend your arm and move it freely. If your PICC is near or at the bend of your elbow, avoid activity with repeated motion at the elbow.  Rest at home for the remainder of the day following PICC line insertion.  Avoid lifting heavy objects as instructed by your health care provider.  Avoid using a crutch with the arm on the same side as your PICC. You may need to use a walker. WHAT SHOULD I KNOW ABOUT MY PICC DRESSING?  Keep your PICC bandage (dressing) clean and dry to prevent infection.  Ask your health care provider when you may shower. Ask your health care provider to teach you how to wrap the PICC when you do take a shower.  Change the PICC dressing as instructed by your health care provider.  Change your PICC dressing if it becomes loose or wet. WHAT SHOULD I KNOW ABOUT PICC CARE?  Check the PICC insertion site   daily for leakage, redness, swelling, or pain.  Do not take a bath, swim, or use hot tubs when you have a PICC. Cover PICC line with clear plastic wrap and tape to keep it dry while showering.  Flush the PICC as directed by your health care provider. Let your health care provider know right away if the PICC is difficult to flush or does not flush. Do not use force to flush the PICC.  Do not use a syringe that is less than 10 mL to flush the PICC.  Never pull or tug on the PICC.  Avoid blood pressure checks on the arm with the PICC.  Keep your PICC identification card with you at all  times.  Do not take the PICC out yourself. Only a trained clinical professional should remove the PICC. SEEK IMMEDIATE MEDICAL CARE IF:  Your PICC is accidentally pulled all the way out. If this happens, cover the insertion site with a bandage or gauze dressing. Do not throw the PICC away. Your health care provider will need to inspect it.  Your PICC was tugged or pulled and has partially come out. Do not  push the PICC back in.  There is any type of drainage, redness, or swelling where the PICC enters the skin.  You cannot flush the PICC, it is difficult to flush, or the PICC leaks around the insertion site when it is flushed.  You hear a "flushing" sound when the PICC is flushed.  You have pain, discomfort, or numbness in your arm, shoulder, or jaw on the same side as the PICC.  You feel your heart "racing" or skipping beats.  You notice a hole or tear in the PICC.  You develop chills or a fever. MAKE SURE YOU:   Understand these instructions.  Will watch your condition.  Will get help right away if you are not doing well or get worse. Document Released: 10/29/2002 Document Revised: 09/08/2013 Document Reviewed: 12/30/2012 ExitCare Patient Information 2015 [employer]. This information is not intended to replace advice given to you by your health care provider. Make sure you discuss any questions you have with your health care provider.  

## 2014-07-07 ENCOUNTER — Ambulatory Visit (HOSPITAL_COMMUNITY)
Admission: RE | Admit: 2014-07-07 | Discharge: 2014-07-07 | Disposition: A | Discharge status: Home / Self Care | Payer: Medicare Other | Source: Ambulatory Visit | Attending: Oncology | Admitting: Oncology

## 2014-07-07 ENCOUNTER — Other Ambulatory Visit: Payer: Self-pay | Admitting: *Deleted

## 2014-07-07 DIAGNOSIS — C9210 Chronic myeloid leukemia, BCR/ABL-positive, not having achieved remission: Secondary | ICD-10-CM | POA: Insufficient documentation

## 2014-07-07 DIAGNOSIS — D649 Anemia, unspecified: Secondary | ICD-10-CM | POA: Insufficient documentation

## 2014-07-09 ENCOUNTER — Other Ambulatory Visit (HOSPITAL_BASED_OUTPATIENT_CLINIC_OR_DEPARTMENT_OTHER): Payer: Medicare Other

## 2014-07-09 ENCOUNTER — Ambulatory Visit (HOSPITAL_BASED_OUTPATIENT_CLINIC_OR_DEPARTMENT_OTHER): Payer: Medicare Other

## 2014-07-09 VITALS — BP 141/85 | HR 82 | Temp 97.9°F

## 2014-07-09 DIAGNOSIS — C9200 Acute myeloblastic leukemia, not having achieved remission: Secondary | ICD-10-CM | POA: Diagnosis not present

## 2014-07-09 DIAGNOSIS — Z452 Encounter for adjustment and management of vascular access device: Secondary | ICD-10-CM

## 2014-07-09 DIAGNOSIS — D72819 Decreased white blood cell count, unspecified: Secondary | ICD-10-CM

## 2014-07-09 LAB — COMPREHENSIVE METABOLIC PANEL (CC13)
AST: 7 U/L (ref 5–34)
Albumin: 2.6 g/dL — ABNORMAL LOW (ref 3.5–5.0)
Alkaline Phosphatase: 77 U/L (ref 40–150)
Anion Gap: 10 mEq/L (ref 3–11)
BILIRUBIN TOTAL: 0.93 mg/dL (ref 0.20–1.20)
BUN: 12.1 mg/dL (ref 7.0–26.0)
CO2: 28 meq/L (ref 22–29)
CREATININE: 0.8 mg/dL (ref 0.7–1.3)
Calcium: 8.5 mg/dL (ref 8.4–10.4)
Chloride: 104 mEq/L (ref 98–109)
EGFR: 86 mL/min/{1.73_m2} — AB (ref >90)
GLUCOSE: 113 mg/dL (ref 70–140)
POTASSIUM: 3.7 meq/L (ref 3.5–5.1)
SODIUM: 142 meq/L (ref 136–145)
Total Protein: 5.9 g/dL — ABNORMAL LOW (ref 6.4–8.3)

## 2014-07-09 LAB — CBC WITH DIFFERENTIAL/PLATELET
BASO%: 0 % (ref 0.0–2.0)
Basophils Absolute: 0 10*3/uL (ref 0.0–0.1)
EOS%: 1.4 % (ref 0.0–7.0)
Eosinophils Absolute: 0 10*3/uL (ref 0.0–0.5)
HCT: 29.1 % — ABNORMAL LOW (ref 38.4–49.9)
HGB: 9.6 g/dL — ABNORMAL LOW (ref 13.0–17.1)
LYMPH#: 0.4 10*3/uL — AB (ref 0.9–3.3)
LYMPH%: 53.6 % — AB (ref 14.0–49.0)
MCH: 32.9 pg (ref 27.2–33.4)
MCHC: 33 g/dL (ref 32.0–36.0)
MCV: 99.7 fL — ABNORMAL HIGH (ref 79.3–98.0)
MONO#: 0 10*3/uL — AB (ref 0.1–0.9)
MONO%: 1.4 % (ref 0.0–14.0)
NEUT%: 43.6 % (ref 39.0–75.0)
NEUTROS ABS: 0.3 10*3/uL — AB (ref 1.5–6.5)
Platelets: 75 10*3/uL — ABNORMAL LOW (ref 140–400)
RBC: 2.92 10*6/uL — AB (ref 4.20–5.82)
RDW: 20.2 % — ABNORMAL HIGH (ref 11.0–14.6)
WBC: 0.7 10*3/uL — AB (ref 4.0–10.3)

## 2014-07-09 LAB — HOLD TUBE, BLOOD BANK

## 2014-07-09 MED ORDER — SODIUM CHLORIDE 0.9 % IJ SOLN
10.0000 mL | INTRAMUSCULAR | Status: DC | PRN
  Administered 2014-07-09: 10 mL via INTRAVENOUS
  Filled 2014-07-09: qty 10

## 2014-07-09 MED ORDER — HEPARIN SOD (PORK) LOCK FLUSH 100 UNIT/ML IV SOLN
500.0000 [IU] | Freq: Once | INTRAVENOUS | Status: AC
  Administered 2014-07-09: 250 [IU] via INTRAVENOUS
  Filled 2014-07-09: qty 5

## 2014-07-09 NOTE — Patient Instructions (Signed)
PICC Home Guide A peripherally inserted central catheter (PICC) is a long, thin, flexible tube that is inserted into a vein in the upper arm. It is a form of intravenous (IV) access. It is considered to be a "central" line because the tip of the PICC ends in a large vein in your chest. This large vein is called the superior vena cava (SVC). The PICC tip ends in the SVC because there is a lot of blood flow in the SVC. This allows medicines and IV fluids to be quickly distributed throughout the body. The PICC is inserted using a sterile technique by a specially trained nurse or physician. After the PICC is inserted, a chest X-ray exam is done to be sure it is in the correct place.  A PICC may be placed for different reasons, such as:  To give medicines and liquid nutrition that can only be given through a central line. Examples are:  Certain antibiotic treatments.  Chemotherapy.  Total parenteral nutrition (TPN).  To take frequent blood samples.  To give IV fluids and blood products.  If there is difficulty placing a peripheral intravenous (PIV) catheter. If taken care of properly, a PICC can remain in place for several months. A PICC can also allow a person to go home from the hospital early. Medicine and PICC care can be managed at home by a family member or home health care team. WHAT PROBLEMS CAN HAPPEN WHEN I HAVE A PICC? Problems with a PICC can occasionally occur. These may include the following:  A blood clot (thrombus) forming in or at the tip of the PICC. This can cause the PICC to become clogged. A clot-dissolving medicine called tissue plasminogen activator (tPA) can be given through the PICC to help break up the clot.  Inflammation of the vein (phlebitis) in which the PICC is placed. Signs of inflammation may include redness, pain at the insertion site, red streaks, or being able to feel a "cord" in the vein where the PICC is located.  Infection in the PICC or at the insertion  site. Signs of infection may include fever, chills, redness, swelling, or pus drainage from the PICC insertion site.  PICC movement (malposition). The PICC tip may move from its original position due to excessive physical activity, forceful coughing, sneezing, or vomiting.  A break or cut in the PICC. It is important to not use scissors near the PICC.  Nerve or tendon irritation or injury during PICC insertion. WHAT SHOULD I KEEP IN MIND ABOUT ACTIVITIES WHEN I HAVE A PICC?  You may bend your arm and move it freely. If your PICC is near or at the bend of your elbow, avoid activity with repeated motion at the elbow.  Rest at home for the remainder of the day following PICC line insertion.  Avoid lifting heavy objects as instructed by your health care provider.  Avoid using a crutch with the arm on the same side as your PICC. You may need to use a walker. WHAT SHOULD I KNOW ABOUT MY PICC DRESSING?  Keep your PICC bandage (dressing) clean and dry to prevent infection.  Ask your health care provider when you may shower. Ask your health care provider to teach you how to wrap the PICC when you do take a shower.  Change the PICC dressing as instructed by your health care provider.  Change your PICC dressing if it becomes loose or wet. WHAT SHOULD I KNOW ABOUT PICC CARE?  Check the PICC insertion site   daily for leakage, redness, swelling, or pain.  Do not take a bath, swim, or use hot tubs when you have a PICC. Cover PICC line with clear plastic wrap and tape to keep it dry while showering.  Flush the PICC as directed by your health care provider. Let your health care provider know right away if the PICC is difficult to flush or does not flush. Do not use force to flush the PICC.  Do not use a syringe that is less than 10 mL to flush the PICC.  Never pull or tug on the PICC.  Avoid blood pressure checks on the arm with the PICC.  Keep your PICC identification card with you at all  times.  Do not take the PICC out yourself. Only a trained clinical professional should remove the PICC. SEEK IMMEDIATE MEDICAL CARE IF:  Your PICC is accidentally pulled all the way out. If this happens, cover the insertion site with a bandage or gauze dressing. Do not throw the PICC away. Your health care provider will need to inspect it.  Your PICC was tugged or pulled and has partially come out. Do not  push the PICC back in.  There is any type of drainage, redness, or swelling where the PICC enters the skin.  You cannot flush the PICC, it is difficult to flush, or the PICC leaks around the insertion site when it is flushed.  You hear a "flushing" sound when the PICC is flushed.  You have pain, discomfort, or numbness in your arm, shoulder, or jaw on the same side as the PICC.  You feel your heart "racing" or skipping beats.  You notice a hole or tear in the PICC.  You develop chills or a fever. MAKE SURE YOU:   Understand these instructions.  Will watch your condition.  Will get help right away if you are not doing well or get worse. Document Released: 10/29/2002 Document Revised: 09/08/2013 Document Reviewed: 12/30/2012 ExitCare Patient Information 2015 [employer]. This information is not intended to replace advice given to you by your health care provider. Make sure you discuss any questions you have with your health care provider.  

## 2014-07-13 ENCOUNTER — Other Ambulatory Visit (HOSPITAL_BASED_OUTPATIENT_CLINIC_OR_DEPARTMENT_OTHER): Payer: Medicare Other

## 2014-07-13 ENCOUNTER — Ambulatory Visit (HOSPITAL_BASED_OUTPATIENT_CLINIC_OR_DEPARTMENT_OTHER): Payer: Medicare Other

## 2014-07-13 VITALS — BP 141/86 | HR 68 | Temp 97.7°F

## 2014-07-13 DIAGNOSIS — C9200 Acute myeloblastic leukemia, not having achieved remission: Secondary | ICD-10-CM | POA: Diagnosis not present

## 2014-07-13 DIAGNOSIS — Z452 Encounter for adjustment and management of vascular access device: Secondary | ICD-10-CM | POA: Diagnosis not present

## 2014-07-13 LAB — CBC WITH DIFFERENTIAL/PLATELET
BASO%: 0.8 % (ref 0.0–2.0)
BASOS ABS: 0 10*3/uL (ref 0.0–0.1)
EOS%: 1.3 % (ref 0.0–7.0)
Eosinophils Absolute: 0 10*3/uL (ref 0.0–0.5)
HCT: 27.5 % — ABNORMAL LOW (ref 38.4–49.9)
HEMOGLOBIN: 8.9 g/dL — AB (ref 13.0–17.1)
LYMPH#: 0.6 10*3/uL — AB (ref 0.9–3.3)
LYMPH%: 47.7 % (ref 14.0–49.0)
MCH: 32.2 pg (ref 27.2–33.4)
MCHC: 32.2 g/dL (ref 32.0–36.0)
MCV: 99.7 fL — AB (ref 79.3–98.0)
MONO#: 0 10*3/uL — ABNORMAL LOW (ref 0.1–0.9)
MONO%: 1.9 % (ref 0.0–14.0)
NEUT#: 0.6 10*3/uL — ABNORMAL LOW (ref 1.5–6.5)
NEUT%: 48.3 % (ref 39.0–75.0)
Platelets: 148 10*3/uL (ref 140–400)
RBC: 2.75 10*6/uL — AB (ref 4.20–5.82)
RDW: 22.6 % — ABNORMAL HIGH (ref 11.0–14.6)
WBC: 1.3 10*3/uL — ABNORMAL LOW (ref 4.0–10.3)

## 2014-07-13 LAB — COMPREHENSIVE METABOLIC PANEL (CC13)
ALBUMIN: 2.7 g/dL — AB (ref 3.5–5.0)
ALT: <6 U/L (ref 0–55)
ANION GAP: 7 meq/L (ref 3–11)
AST: 8 U/L (ref 5–34)
Alkaline Phosphatase: 73 U/L (ref 40–150)
BUN: 9.7 mg/dL (ref 7.0–26.0)
CALCIUM: 8.6 mg/dL (ref 8.4–10.4)
CO2: 27 mEq/L (ref 22–29)
Chloride: 106 mEq/L (ref 98–109)
Creatinine: 0.8 mg/dL (ref 0.7–1.3)
EGFR: 87 mL/min/{1.73_m2} — AB (ref >90)
Glucose: 103 mg/dl (ref 70–140)
Potassium: 4 mEq/L (ref 3.5–5.1)
SODIUM: 140 meq/L (ref 136–145)
Total Bilirubin: 0.75 mg/dL (ref 0.20–1.20)
Total Protein: 6 g/dL — ABNORMAL LOW (ref 6.4–8.3)

## 2014-07-13 LAB — MAGNESIUM (CC13): MAGNESIUM: 2.3 mg/dL (ref 1.5–2.5)

## 2014-07-13 LAB — HOLD TUBE, BLOOD BANK

## 2014-07-13 MED ORDER — SODIUM CHLORIDE 0.9 % IJ SOLN
10.0000 mL | INTRAMUSCULAR | Status: DC | PRN
  Administered 2014-07-13: 10 mL via INTRAVENOUS
  Filled 2014-07-13: qty 10

## 2014-07-13 MED ORDER — HEPARIN SOD (PORK) LOCK FLUSH 100 UNIT/ML IV SOLN
500.0000 [IU] | Freq: Once | INTRAVENOUS | Status: AC
  Administered 2014-07-13: 250 [IU] via INTRAVENOUS
  Filled 2014-07-13: qty 5

## 2014-07-13 NOTE — Patient Instructions (Signed)
PICC Home Guide A peripherally inserted central catheter (PICC) is a long, thin, flexible tube that is inserted into a vein in the upper arm. It is a form of intravenous (IV) access. It is considered to be a "central" line because the tip of the PICC ends in a large vein in your chest. This large vein is called the superior vena cava (SVC). The PICC tip ends in the SVC because there is a lot of blood flow in the SVC. This allows medicines and IV fluids to be quickly distributed throughout the body. The PICC is inserted using a sterile technique by a specially trained nurse or physician. After the PICC is inserted, a chest X-ray exam is done to be sure it is in the correct place.  A PICC may be placed for different reasons, such as:  To give medicines and liquid nutrition that can only be given through a central line. Examples are:  Certain antibiotic treatments.  Chemotherapy.  Total parenteral nutrition (TPN).  To take frequent blood samples.  To give IV fluids and blood products.  If there is difficulty placing a peripheral intravenous (PIV) catheter. If taken care of properly, a PICC can remain in place for several months. A PICC can also allow a person to go home from the hospital early. Medicine and PICC care can be managed at home by a family member or home health care team. WHAT PROBLEMS CAN HAPPEN WHEN I HAVE A PICC? Problems with a PICC can occasionally occur. These may include the following:  A blood clot (thrombus) forming in or at the tip of the PICC. This can cause the PICC to become clogged. A clot-dissolving medicine called tissue plasminogen activator (tPA) can be given through the PICC to help break up the clot.  Inflammation of the vein (phlebitis) in which the PICC is placed. Signs of inflammation may include redness, pain at the insertion site, red streaks, or being able to feel a "cord" in the vein where the PICC is located.  Infection in the PICC or at the insertion  site. Signs of infection may include fever, chills, redness, swelling, or pus drainage from the PICC insertion site.  PICC movement (malposition). The PICC tip may move from its original position due to excessive physical activity, forceful coughing, sneezing, or vomiting.  A break or cut in the PICC. It is important to not use scissors near the PICC.  Nerve or tendon irritation or injury during PICC insertion. WHAT SHOULD I KEEP IN MIND ABOUT ACTIVITIES WHEN I HAVE A PICC?  You may bend your arm and move it freely. If your PICC is near or at the bend of your elbow, avoid activity with repeated motion at the elbow.  Rest at home for the remainder of the day following PICC line insertion.  Avoid lifting heavy objects as instructed by your health care provider.  Avoid using a crutch with the arm on the same side as your PICC. You may need to use a walker. WHAT SHOULD I KNOW ABOUT MY PICC DRESSING?  Keep your PICC bandage (dressing) clean and dry to prevent infection.  Ask your health care provider when you may shower. Ask your health care provider to teach you how to wrap the PICC when you do take a shower.  Change the PICC dressing as instructed by your health care provider.  Change your PICC dressing if it becomes loose or wet. WHAT SHOULD I KNOW ABOUT PICC CARE?  Check the PICC insertion site   daily for leakage, redness, swelling, or pain.  Do not take a bath, swim, or use hot tubs when you have a PICC. Cover PICC line with clear plastic wrap and tape to keep it dry while showering.  Flush the PICC as directed by your health care provider. Let your health care provider know right away if the PICC is difficult to flush or does not flush. Do not use force to flush the PICC.  Do not use a syringe that is less than 10 mL to flush the PICC.  Never pull or tug on the PICC.  Avoid blood pressure checks on the arm with the PICC.  Keep your PICC identification card with you at all  times.  Do not take the PICC out yourself. Only a trained clinical professional should remove the PICC. SEEK IMMEDIATE MEDICAL CARE IF:  Your PICC is accidentally pulled all the way out. If this happens, cover the insertion site with a bandage or gauze dressing. Do not throw the PICC away. Your health care provider will need to inspect it.  Your PICC was tugged or pulled and has partially come out. Do not  push the PICC back in.  There is any type of drainage, redness, or swelling where the PICC enters the skin.  You cannot flush the PICC, it is difficult to flush, or the PICC leaks around the insertion site when it is flushed.  You hear a "flushing" sound when the PICC is flushed.  You have pain, discomfort, or numbness in your arm, shoulder, or jaw on the same side as the PICC.  You feel your heart "racing" or skipping beats.  You notice a hole or tear in the PICC.  You develop chills or a fever. MAKE SURE YOU:   Understand these instructions.  Will watch your condition.  Will get help right away if you are not doing well or get worse. Document Released: 10/29/2002 Document Revised: 09/08/2013 Document Reviewed: 12/30/2012 ExitCare Patient Information 2015 [employer]. This information is not intended to replace advice given to you by your health care provider. Make sure you discuss any questions you have with your health care provider.  

## 2014-07-14 ENCOUNTER — Ambulatory Visit: Payer: Non-veteran care

## 2014-07-14 NOTE — Progress Notes (Signed)
Per Dr. Alen Blew pt does not need blood transfusion today despite feelings of weakness and sob.  Instructed patient that if [facility] feels like he needs blood transfusion then he needs to follow up with them. Pt verbalized understanding.

## 2014-07-15 ENCOUNTER — Ambulatory Visit (INDEPENDENT_AMBULATORY_CARE_PROVIDER_SITE_OTHER): Payer: Medicare Other | Admitting: Cardiology

## 2014-07-15 ENCOUNTER — Encounter: Payer: Self-pay | Admitting: Cardiology

## 2014-07-15 VITALS — BP 134/72 | HR 70 | Ht 69.0 in | Wt 179.8 lb

## 2014-07-15 DIAGNOSIS — I5022 Chronic systolic (congestive) heart failure: Secondary | ICD-10-CM | POA: Diagnosis not present

## 2014-07-15 DIAGNOSIS — C9200 Acute myeloblastic leukemia, not having achieved remission: Secondary | ICD-10-CM | POA: Diagnosis not present

## 2014-07-15 MED ORDER — HYDRALAZINE HCL 25 MG PO TABS: 25.0000 mg | ORAL_TABLET | Freq: Two times a day (BID) | ORAL | Status: AC

## 2014-07-15 NOTE — Progress Notes (Signed)
Cardiology Office Note   Date:  07/15/2014   ID:  [name], DOB 04-03-1941, MRN [id number]  PCP:  [name], MD  Cardiologist:   Candee Furbish, MD   No chief complaint on file.     History of Present Illness: [name] is a 74 y.o. male who presents for hospital follow-up for acute heart failure, ejection fraction 35-40% on echocardiogram with BNP of 3304 in December 2015. 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  He had previously several brief episodes of near-syncope lasting 2-3 seconds, no chest pain or palpitations and mild dyspnea noted during episodes.  Echocardiogram showed 35-40% ejection fraction, PA pressure 55 mmHg, severely dilated left atrium.  Throughout hospitalization he diuresed 8.8 L. Serum creatinine was approximately 1.3 on discharge. Carvedilol 6.25 mg twice a day was started. Hydralazine and nitrates were used for afterload reduction. 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 Stress test has not been performed as of yet.  There did not appear to be atrial fibrillation during hospitalization. PVCs and PACs were noted. He had appointment set for 7 days post hospitalization but did not make appointment.  He has overall been doing fairly well. He has been taking his Lasix. This is helped him out significantly with his breathing. He does not like the side effects from taking hydralazine 3 times a day. In the past he's had trouble with taking ace inhibitors because of side effects. He would like to decrease this to twice a day. This is fine. He is tolerating the Imdur well. He is also tolerating the carvedilol well too.   Past Medical History  Diagnosis Date  . Crohn's disease   . Iron deficiency anemia   . Leukopenia   . Hypertension   . BPH  (benign prostatic hyperplasia)   . 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 Kidney stones   . AML (acute myeloblastic leukemia)   . Shortness of breath dyspnea     Past Surgical History  Procedure Laterality Date  . Lumbar fusion      L5-S1  . Kidney stone surgery    . Cataract extraction Right   . Cardiac catheterization  2001     Current Outpatient Prescriptions  Medication Sig Dispense Refill  . carvedilol (COREG) 6.25 MG tablet Take 1 tablet (6.25 mg total) by mouth 2 (two) times daily with a meal. 60 tablet 0  . finasteride (PROSCAR) 5 MG tablet Take 5 mg by mouth at bedtime.     . fluconazole (DIFLUCAN) 200 MG tablet Take 200 mg by mouth at bedtime.     . fluticasone (FLONASE) 50 MCG/ACT nasal spray Place 1 spray into both nostrils daily. (Patient taking differently: Place 1 spray into both nostrils daily as needed. ) 16 g 2  . hydrALAZINE (APRESOLINE) 25 MG tablet Take 1 tablet (25 mg total) by mouth 2 (two) times daily. 60 tablet 11  . isosorbide mononitrate (IMDUR) 30 MG 24 hr tablet Take 1 tablet (30 mg total) by mouth daily. 30 tablet 0  . loratadine (CLARITIN) 10 MG tablet Take 10 mg by mouth daily as needed for allergies.    . mirtazapine (REMERON) 15 MG tablet Take 15 mg by mouth at bedtime.    Marland Kitchen omeprazole (PRILOSEC) 20 MG  capsule Take 20 mg by mouth daily before breakfast.     . ondansetron (ZOFRAN) 4 MG tablet Take 1 tablet (4 mg total) by mouth every 8 (eight) hours as needed for nausea or vomiting. 65 tablet 1  . oxyCODONE (OXY IR/ROXICODONE) 5 MG immediate release tablet Take 5 mg by mouth every 4 (four) hours as needed for moderate pain or severe pain.    Marland Kitchen oxyCODONE-acetaminophen (PERCOCET/ROXICET) 5-325 MG per tablet Take 1-2 tablets by mouth every 3 (three) hours as needed for moderate pain. 65 tablet 0  . polyethylene glycol (MIRALAX / GLYCOLAX) packet Take 17 g by mouth daily as needed for mild constipation.    . potassium chloride  (MICRO-K) 10 MEQ CR capsule Take 10 mEq by mouth daily.     . predniSONE (DELTASONE) 20 MG tablet Take 1 tablet (20 mg total) by mouth daily with breakfast. 30 tablet 0  . prochlorperazine (COMPAZINE) 5 MG tablet Take 5 mg by mouth every 6 (six) hours as needed for nausea or vomiting.    . sennosides-docusate sodium (SENOKOT-S) 8.6-50 MG tablet Take 1-2 tablets by mouth daily as needed for constipation. 1-2 tablets 2 times daily by mouth.     No current facility-administered medications for this visit.    Allergies:   Metoprolol; Quinapril; Maxzide; and Pentasa    Social History:  The patient  reports that he has never smoked. He has never used smokeless tobacco. He reports that he does not drink alcohol or use illicit drugs.   Family History:  The patient's family history includes CAD in his father; Colon cancer in his mother; Emphysema in his father; Ovarian cancer in his paternal aunt; Stroke in his father. There is no history of Crohn's disease, Ulcerative colitis, or Leukemia.    ROS:  Please see the history of present illness.   Otherwise, review of systems are positive for appetite change, recent chills, leg swelling, shortness of breath with laying down, skipped heartbeats, chest pressure, fatigue, cough, diarrhea, abdominal pain, wheezing, constipation, nausea, rash, easy bruising.   All other systems are reviewed and negative.    PHYSICAL EXAM: VS:  BP 134/72 mmHg  Pulse 70  Ht 5\' 9"  (1.753 m)  Wt 179 lb 12.8 oz (81.557 kg)  BMI 26.54 kg/m2 , BMI Body mass index is 26.54 kg/(m^2). GEN: Well nourished, well developed, in no acute distress HEENT: normal Neck: no JVD, carotid bruits, or masses Cardiac: RRR; no murmurs, rubs, or gallops,no edema  Respiratory:  clear to auscultation bilaterally, normal work of breathing GI: soft, nontender, nondistended, + BS MS: no deformity or atrophy Skin: warm and dry, no rash Neuro:  Strength and sensation are intact Psych: euthymic mood,  full affect   EKG:  EKG is not ordered today.   Recent Labs: 05/04/2014: B Natriuretic Peptide 3304.8*; TSH 1.404 07/13/2014: ALT <6; BUN 9.7; Creatinine 0.8; Hemoglobin 8.9*; Magnesium 2.3; Platelets 148; Potassium 4.0; Sodium 140    Lipid Panel No results found for: CHOL, TRIG, HDL, CHOLHDL, VLDL, LDLCALC, LDLDIRECT    Wt Readings from Last 3 Encounters:  07/15/14 179 lb 12.8 oz (81.557 kg)  05/07/14 171 lb (77.565 kg)  12/25/13 178 lb 4.8 oz (80.876 kg)      Other studies Reviewed: Additional studies/ records that were reviewed today include:   Echocardiogram: 05/04/14 - Left ventricle: The cavity size was normal. Wall thickness was increased in a pattern of moderate LVH. There was focal basal hypertrophy. Systolic function was moderately reduced. The estimated  ejection fraction was in the range of 35% to 40%. Diffuse hypokinesis. - Mitral valve: There was mild regurgitation. - Left atrium: The atrium was severely dilated. - Pulmonary arteries: Systolic pressure was moderately increased. PA peak pressure: 55 mm Hg (S).    ASSESSMENT AND PLAN:  1.  Chronic systolic heart failure/cardiomyopathy  - ejection fraction 35-40% in hospital December 2015  - Etiology possibly secondary to chemotherapeutic agent  - I think it would be reasonable since it is been approximately 3 months with beta blocker therapy/hydralazine isosorbide to recheck echocardiogram to see if there is been improvement in ejection fraction.  - I will decrease his hydralazine to twice a day. Blood pressure seems to be able to tolerate.  - If ejection fraction remains low, we will likely proceed with stress test.  2. AML-per cancer treatment team.  3. Near syncope-no further recurrences.   Current medicines are reviewed at length with the patient today.  The patient has concerns regarding medicines.  The following changes have been made:  I will decrease his hydralazine.  Labs/ tests  ordered today include:   Orders Placed This Encounter  Procedures  . 2D Echocardiogram without contrast     Disposition:   FU with  in 4 month   Signed, Candee Furbish, MD  07/15/2014 5:04 PM    [facility] [place], [place], [place]  [address] Phone: [phone]; Fax: [phone]

## 2014-07-15 NOTE — Patient Instructions (Signed)
Your physician has recommended you make the following change in your medication:  DECREASE Hydralazine to twice daily  Your physician has requested that you have an echocardiogram. Echocardiography is a painless test that uses sound waves to create images of your heart. It provides your doctor with information about the size and shape of your heart and how well your heart's chambers and valves are working. This procedure takes approximately one hour. There are no restrictions for this procedure.  Your physician wants you to follow-up in: 4 months with Dr. Marlou Porch.  You will receive a reminder letter in the mail two months in advance. If you don't receive a letter, please call our office to schedule the follow-up appointment.

## 2014-07-16 ENCOUNTER — Ambulatory Visit (HOSPITAL_BASED_OUTPATIENT_CLINIC_OR_DEPARTMENT_OTHER): Payer: Medicare Other

## 2014-07-16 ENCOUNTER — Other Ambulatory Visit: Payer: Medicare Other

## 2014-07-16 ENCOUNTER — Other Ambulatory Visit (HOSPITAL_BASED_OUTPATIENT_CLINIC_OR_DEPARTMENT_OTHER): Payer: Medicare Other

## 2014-07-16 VITALS — BP 143/75 | HR 64 | Temp 98.4°F

## 2014-07-16 DIAGNOSIS — C9200 Acute myeloblastic leukemia, not having achieved remission: Secondary | ICD-10-CM

## 2014-07-16 DIAGNOSIS — Z452 Encounter for adjustment and management of vascular access device: Secondary | ICD-10-CM

## 2014-07-16 DIAGNOSIS — D72819 Decreased white blood cell count, unspecified: Secondary | ICD-10-CM

## 2014-07-16 LAB — HOLD TUBE, BLOOD BANK

## 2014-07-16 LAB — CBC WITH DIFFERENTIAL/PLATELET
BASO%: 0.6 % (ref 0.0–2.0)
BASOS ABS: 0 10*3/uL (ref 0.0–0.1)
EOS ABS: 0 10*3/uL (ref 0.0–0.5)
EOS%: 1.2 % (ref 0.0–7.0)
HCT: 29.8 % — ABNORMAL LOW (ref 38.4–49.9)
HGB: 9.6 g/dL — ABNORMAL LOW (ref 13.0–17.1)
LYMPH%: 35.9 % (ref 14.0–49.0)
MCH: 32.8 pg (ref 27.2–33.4)
MCHC: 32.2 g/dL (ref 32.0–36.0)
MCV: 101.7 fL — ABNORMAL HIGH (ref 79.3–98.0)
MONO#: 0 10*3/uL — AB (ref 0.1–0.9)
MONO%: 2.4 % (ref 0.0–14.0)
NEUT%: 59.9 % (ref 39.0–75.0)
NEUTROS ABS: 1 10*3/uL — AB (ref 1.5–6.5)
Platelets: 171 10*3/uL (ref 140–400)
RBC: 2.93 10*6/uL — ABNORMAL LOW (ref 4.20–5.82)
RDW: 20.2 % — ABNORMAL HIGH (ref 11.0–14.6)
WBC: 1.7 10*3/uL — ABNORMAL LOW (ref 4.0–10.3)
lymph#: 0.6 10*3/uL — ABNORMAL LOW (ref 0.9–3.3)

## 2014-07-16 LAB — COMPREHENSIVE METABOLIC PANEL (CC13)
ALBUMIN: 2.7 g/dL — AB (ref 3.5–5.0)
ALK PHOS: 70 U/L (ref 40–150)
ANION GAP: 9 meq/L (ref 3–11)
AST: 6 U/L (ref 5–34)
BUN: 11.2 mg/dL (ref 7.0–26.0)
CALCIUM: 8.2 mg/dL — AB (ref 8.4–10.4)
CHLORIDE: 106 meq/L (ref 98–109)
CO2: 26 meq/L (ref 22–29)
Creatinine: 1 mg/dL (ref 0.7–1.3)
EGFR: 77 mL/min/{1.73_m2} — AB (ref >90)
GLUCOSE: 113 mg/dL (ref 70–140)
POTASSIUM: 4 meq/L (ref 3.5–5.1)
Sodium: 141 mEq/L (ref 136–145)
TOTAL PROTEIN: 5.9 g/dL — AB (ref 6.4–8.3)
Total Bilirubin: 0.58 mg/dL (ref 0.20–1.20)

## 2014-07-16 LAB — MAGNESIUM (CC13): Magnesium: 2.1 mg/dl (ref 1.5–2.5)

## 2014-07-16 MED ORDER — SODIUM CHLORIDE 0.9 % IJ SOLN
10.0000 mL | INTRAMUSCULAR | Status: DC | PRN
  Administered 2014-07-16: 10 mL via INTRAVENOUS
  Filled 2014-07-16: qty 10

## 2014-07-16 MED ORDER — HEPARIN SOD (PORK) LOCK FLUSH 100 UNIT/ML IV SOLN
500.0000 [IU] | Freq: Once | INTRAVENOUS | Status: AC
  Administered 2014-07-16: 250 [IU] via INTRAVENOUS
  Filled 2014-07-16: qty 5

## 2014-07-16 NOTE — Patient Instructions (Signed)
PICC Home Guide A peripherally inserted central catheter (PICC) is a long, thin, flexible tube that is inserted into a vein in the upper arm. It is a form of intravenous (IV) access. It is considered to be a "central" line because the tip of the PICC ends in a large vein in your chest. This large vein is called the superior vena cava (SVC). The PICC tip ends in the SVC because there is a lot of blood flow in the SVC. This allows medicines and IV fluids to be quickly distributed throughout the body. The PICC is inserted using a sterile technique by a specially trained nurse or physician. After the PICC is inserted, a chest X-ray exam is done to be sure it is in the correct place.  A PICC may be placed for different reasons, such as:  To give medicines and liquid nutrition that can only be given through a central line. Examples are:  Certain antibiotic treatments.  Chemotherapy.  Total parenteral nutrition (TPN).  To take frequent blood samples.  To give IV fluids and blood products.  If there is difficulty placing a peripheral intravenous (PIV) catheter. If taken care of properly, a PICC can remain in place for several months. A PICC can also allow a person to go home from the hospital early. Medicine and PICC care can be managed at home by a family member or home health care team. WHAT PROBLEMS CAN HAPPEN WHEN I HAVE A PICC? Problems with a PICC can occasionally occur. These may include the following:  A blood clot (thrombus) forming in or at the tip of the PICC. This can cause the PICC to become clogged. A clot-dissolving medicine called tissue plasminogen activator (tPA) can be given through the PICC to help break up the clot.  Inflammation of the vein (phlebitis) in which the PICC is placed. Signs of inflammation may include redness, pain at the insertion site, red streaks, or being able to feel a "cord" in the vein where the PICC is located.  Infection in the PICC or at the insertion  site. Signs of infection may include fever, chills, redness, swelling, or pus drainage from the PICC insertion site.  PICC movement (malposition). The PICC tip may move from its original position due to excessive physical activity, forceful coughing, sneezing, or vomiting.  A break or cut in the PICC. It is important to not use scissors near the PICC.  Nerve or tendon irritation or injury during PICC insertion. WHAT SHOULD I KEEP IN MIND ABOUT ACTIVITIES WHEN I HAVE A PICC?  You may bend your arm and move it freely. If your PICC is near or at the bend of your elbow, avoid activity with repeated motion at the elbow.  Rest at home for the remainder of the day following PICC line insertion.  Avoid lifting heavy objects as instructed by your health care provider.  Avoid using a crutch with the arm on the same side as your PICC. You may need to use a walker. WHAT SHOULD I KNOW ABOUT MY PICC DRESSING?  Keep your PICC bandage (dressing) clean and dry to prevent infection.  Ask your health care provider when you may shower. Ask your health care provider to teach you how to wrap the PICC when you do take a shower.  Change the PICC dressing as instructed by your health care provider.  Change your PICC dressing if it becomes loose or wet. WHAT SHOULD I KNOW ABOUT PICC CARE?  Check the PICC insertion site   daily for leakage, redness, swelling, or pain.  Do not take a bath, swim, or use hot tubs when you have a PICC. Cover PICC line with clear plastic wrap and tape to keep it dry while showering.  Flush the PICC as directed by your health care provider. Let your health care provider know right away if the PICC is difficult to flush or does not flush. Do not use force to flush the PICC.  Do not use a syringe that is less than 10 mL to flush the PICC.  Never pull or tug on the PICC.  Avoid blood pressure checks on the arm with the PICC.  Keep your PICC identification card with you at all  times.  Do not take the PICC out yourself. Only a trained clinical professional should remove the PICC. SEEK IMMEDIATE MEDICAL CARE IF:  Your PICC is accidentally pulled all the way out. If this happens, cover the insertion site with a bandage or gauze dressing. Do not throw the PICC away. Your health care provider will need to inspect it.  Your PICC was tugged or pulled and has partially come out. Do not  push the PICC back in.  There is any type of drainage, redness, or swelling where the PICC enters the skin.  You cannot flush the PICC, it is difficult to flush, or the PICC leaks around the insertion site when it is flushed.  You hear a "flushing" sound when the PICC is flushed.  You have pain, discomfort, or numbness in your arm, shoulder, or jaw on the same side as the PICC.  You feel your heart "racing" or skipping beats.  You notice a hole or tear in the PICC.  You develop chills or a fever. MAKE SURE YOU:   Understand these instructions.  Will watch your condition.  Will get help right away if you are not doing well or get worse. Document Released: 10/29/2002 Document Revised: 09/08/2013 Document Reviewed: 12/30/2012 ExitCare Patient Information 2015 [employer]. This information is not intended to replace advice given to you by your health care provider. Make sure you discuss any questions you have with your health care provider.  

## 2014-07-23 ENCOUNTER — Other Ambulatory Visit: Payer: Self-pay | Admitting: *Deleted

## 2014-07-23 ENCOUNTER — Telehealth: Payer: Self-pay | Admitting: *Deleted

## 2014-07-23 ENCOUNTER — Encounter: Payer: Self-pay | Admitting: *Deleted

## 2014-07-23 DIAGNOSIS — C9200 Acute myeloblastic leukemia, not having achieved remission: Secondary | ICD-10-CM

## 2014-07-23 NOTE — Telephone Encounter (Signed)
Per staff message and POF I have scheduled appts. Advised scheduler of appts. JMW  

## 2014-07-27 ENCOUNTER — Ambulatory Visit (HOSPITAL_BASED_OUTPATIENT_CLINIC_OR_DEPARTMENT_OTHER): Payer: Medicare Other

## 2014-07-27 ENCOUNTER — Other Ambulatory Visit (HOSPITAL_BASED_OUTPATIENT_CLINIC_OR_DEPARTMENT_OTHER): Payer: Medicare Other

## 2014-07-27 ENCOUNTER — Ambulatory Visit: Payer: Medicare Other

## 2014-07-27 VITALS — BP 155/79 | HR 60 | Temp 97.9°F | Resp 18

## 2014-07-27 DIAGNOSIS — C9200 Acute myeloblastic leukemia, not having achieved remission: Secondary | ICD-10-CM

## 2014-07-27 DIAGNOSIS — Z452 Encounter for adjustment and management of vascular access device: Secondary | ICD-10-CM | POA: Diagnosis not present

## 2014-07-27 DIAGNOSIS — Z95828 Presence of other vascular implants and grafts: Secondary | ICD-10-CM

## 2014-07-27 LAB — COMPREHENSIVE METABOLIC PANEL (CC13)
ALBUMIN: 2.9 g/dL — AB (ref 3.5–5.0)
ALT: <6 U/L (ref 0–55)
AST: 9 U/L (ref 5–34)
Alkaline Phosphatase: 59 U/L (ref 40–150)
Anion Gap: 10 mEq/L (ref 3–11)
BUN: 11.6 mg/dL (ref 7.0–26.0)
CALCIUM: 8.2 mg/dL — AB (ref 8.4–10.4)
CHLORIDE: 108 meq/L (ref 98–109)
CO2: 26 mEq/L (ref 22–29)
Creatinine: 0.8 mg/dL (ref 0.7–1.3)
EGFR: 88 mL/min/{1.73_m2} — AB (ref >90)
GLUCOSE: 89 mg/dL (ref 70–140)
POTASSIUM: 3.4 meq/L — AB (ref 3.5–5.1)
Sodium: 143 mEq/L (ref 136–145)
Total Bilirubin: 0.81 mg/dL (ref 0.20–1.20)
Total Protein: 5.7 g/dL — ABNORMAL LOW (ref 6.4–8.3)

## 2014-07-27 LAB — CBC WITH DIFFERENTIAL/PLATELET
BASO%: 0.5 % (ref 0.0–2.0)
BASOS ABS: 0 10*3/uL (ref 0.0–0.1)
EOS ABS: 0 10*3/uL (ref 0.0–0.5)
EOS%: 0.6 % (ref 0.0–7.0)
HCT: 27.8 % — ABNORMAL LOW (ref 38.4–49.9)
HGB: 9 g/dL — ABNORMAL LOW (ref 13.0–17.1)
LYMPH#: 0.7 10*3/uL — AB (ref 0.9–3.3)
LYMPH%: 31.9 % (ref 14.0–49.0)
MCH: 32.4 pg (ref 27.2–33.4)
MCHC: 32.3 g/dL (ref 32.0–36.0)
MCV: 100.2 fL — ABNORMAL HIGH (ref 79.3–98.0)
MONO#: 0.1 10*3/uL (ref 0.1–0.9)
MONO%: 3.1 % (ref 0.0–14.0)
NEUT%: 63.9 % (ref 39.0–75.0)
NEUTROS ABS: 1.5 10*3/uL (ref 1.5–6.5)
PLATELETS: 88 10*3/uL — AB (ref 140–400)
RBC: 2.77 10*6/uL — ABNORMAL LOW (ref 4.20–5.82)
RDW: 22.4 % — ABNORMAL HIGH (ref 11.0–14.6)
WBC: 2.3 10*3/uL — AB (ref 4.0–10.3)

## 2014-07-27 LAB — HOLD TUBE, BLOOD BANK

## 2014-07-27 LAB — MAGNESIUM (CC13): Magnesium: 2 mg/dl (ref 1.5–2.5)

## 2014-07-27 MED ORDER — SODIUM CHLORIDE 0.9 % IJ SOLN
10.0000 mL | INTRAMUSCULAR | Status: DC | PRN
  Administered 2014-07-27: 10 mL via INTRAVENOUS
  Filled 2014-07-27: qty 10

## 2014-07-27 NOTE — Progress Notes (Signed)
Pt's labs look good.  Dr. Alen Blew aware.  Pt does not need transfusion today.  PAC deaccessed and discharged home stable.

## 2014-07-27 NOTE — Patient Instructions (Signed)

## 2014-07-28 ENCOUNTER — Ambulatory Visit (HOSPITAL_COMMUNITY): Payer: Medicare Other | Attending: Cardiology

## 2014-07-28 DIAGNOSIS — I10 Essential (primary) hypertension: Secondary | ICD-10-CM | POA: Diagnosis not present

## 2014-07-28 DIAGNOSIS — I5022 Chronic systolic (congestive) heart failure: Secondary | ICD-10-CM

## 2014-07-28 NOTE — Progress Notes (Signed)
2D Echo completed. 07/28/2014 

## 2014-07-31 ENCOUNTER — Other Ambulatory Visit: Payer: Self-pay | Admitting: *Deleted

## 2014-07-31 DIAGNOSIS — C9200 Acute myeloblastic leukemia, not having achieved remission: Secondary | ICD-10-CM

## 2014-07-31 DIAGNOSIS — I5022 Chronic systolic (congestive) heart failure: Secondary | ICD-10-CM

## 2014-08-03 ENCOUNTER — Ambulatory Visit: Payer: Medicare Other

## 2014-08-03 ENCOUNTER — Other Ambulatory Visit: Payer: Self-pay | Admitting: *Deleted

## 2014-08-03 ENCOUNTER — Other Ambulatory Visit (HOSPITAL_BASED_OUTPATIENT_CLINIC_OR_DEPARTMENT_OTHER): Payer: Medicare Other

## 2014-08-03 ENCOUNTER — Ambulatory Visit (HOSPITAL_BASED_OUTPATIENT_CLINIC_OR_DEPARTMENT_OTHER): Payer: Medicare Other

## 2014-08-03 VITALS — BP 150/70 | HR 60 | Temp 97.3°F | Resp 18

## 2014-08-03 VITALS — BP 138/53 | HR 61 | Temp 98.4°F

## 2014-08-03 DIAGNOSIS — C9200 Acute myeloblastic leukemia, not having achieved remission: Secondary | ICD-10-CM

## 2014-08-03 DIAGNOSIS — D649 Anemia, unspecified: Secondary | ICD-10-CM

## 2014-08-03 DIAGNOSIS — C9210 Chronic myeloid leukemia, BCR/ABL-positive, not having achieved remission: Secondary | ICD-10-CM

## 2014-08-03 DIAGNOSIS — Z95828 Presence of other vascular implants and grafts: Secondary | ICD-10-CM

## 2014-08-03 LAB — CBC WITH DIFFERENTIAL/PLATELET
BASO%: 0 % (ref 0.0–2.0)
BASOS ABS: 0 10*3/uL (ref 0.0–0.1)
EOS%: 0.7 % (ref 0.0–7.0)
Eosinophils Absolute: 0 10*3/uL (ref 0.0–0.5)
HEMATOCRIT: 22.6 % — AB (ref 38.4–49.9)
HEMOGLOBIN: 7.5 g/dL — AB (ref 13.0–17.1)
LYMPH#: 0.6 10*3/uL — AB (ref 0.9–3.3)
LYMPH%: 43.8 % (ref 14.0–49.0)
MCH: 33.5 pg — ABNORMAL HIGH (ref 27.2–33.4)
MCHC: 33.2 g/dL (ref 32.0–36.0)
MCV: 100.9 fL — ABNORMAL HIGH (ref 79.3–98.0)
MONO#: 0 10*3/uL — AB (ref 0.1–0.9)
MONO%: 2.7 % (ref 0.0–14.0)
NEUT%: 52.8 % (ref 39.0–75.0)
NEUTROS ABS: 0.8 10*3/uL — AB (ref 1.5–6.5)
Platelets: 23 10*3/uL — ABNORMAL LOW (ref 140–400)
RBC: 2.24 10*6/uL — ABNORMAL LOW (ref 4.20–5.82)
RDW: 19.5 % — ABNORMAL HIGH (ref 11.0–14.6)
WBC: 1.5 10*3/uL — ABNORMAL LOW (ref 4.0–10.3)

## 2014-08-03 LAB — COMPREHENSIVE METABOLIC PANEL (CC13)
ALBUMIN: 2.8 g/dL — AB (ref 3.5–5.0)
ALK PHOS: 54 U/L (ref 40–150)
ALT: 6 U/L (ref 0–55)
AST: <7 U/L (ref 5–34)
Anion Gap: 8 mEq/L (ref 3–11)
BUN: 12.6 mg/dL (ref 7.0–26.0)
CO2: 26 mEq/L (ref 22–29)
Calcium: 7.8 mg/dL — ABNORMAL LOW (ref 8.4–10.4)
Chloride: 109 mEq/L (ref 98–109)
Creatinine: 0.8 mg/dL (ref 0.7–1.3)
EGFR: 89 mL/min/{1.73_m2} — AB (ref >90)
Glucose: 99 mg/dl (ref 70–140)
POTASSIUM: 3.2 meq/L — AB (ref 3.5–5.1)
SODIUM: 144 meq/L (ref 136–145)
Total Bilirubin: 0.71 mg/dL (ref 0.20–1.20)
Total Protein: 5.3 g/dL — ABNORMAL LOW (ref 6.4–8.3)

## 2014-08-03 LAB — MAGNESIUM (CC13): Magnesium: 1.8 mg/dL (ref 1.5–2.5)

## 2014-08-03 LAB — HOLD TUBE, BLOOD BANK

## 2014-08-03 LAB — PREPARE RBC (CROSSMATCH)

## 2014-08-03 MED ORDER — HEPARIN SOD (PORK) LOCK FLUSH 100 UNIT/ML IV SOLN
500.0000 [IU] | Freq: Every day | INTRAVENOUS | Status: AC | PRN
  Administered 2014-08-03: 500 [IU]
  Filled 2014-08-03: qty 5

## 2014-08-03 MED ORDER — ACETAMINOPHEN 325 MG PO TABS
ORAL_TABLET | ORAL | Status: AC
  Filled 2014-08-03: qty 2

## 2014-08-03 MED ORDER — SODIUM CHLORIDE 0.9 % IJ SOLN
10.0000 mL | INTRAMUSCULAR | Status: DC | PRN
  Administered 2014-08-03: 10 mL via INTRAVENOUS
  Filled 2014-08-03: qty 10

## 2014-08-03 MED ORDER — DIPHENHYDRAMINE HCL 25 MG PO CAPS
25.0000 mg | ORAL_CAPSULE | Freq: Once | ORAL | Status: AC
  Administered 2014-08-03: 25 mg via ORAL

## 2014-08-03 MED ORDER — HEPARIN SOD (PORK) LOCK FLUSH 100 UNIT/ML IV SOLN
500.0000 [IU] | Freq: Once | INTRAVENOUS | Status: AC
  Administered 2014-08-03: 500 [IU] via INTRAVENOUS
  Filled 2014-08-03: qty 5

## 2014-08-03 MED ORDER — DIPHENHYDRAMINE HCL 25 MG PO CAPS
ORAL_CAPSULE | ORAL | Status: AC
  Filled 2014-08-03: qty 2

## 2014-08-03 MED ORDER — SODIUM CHLORIDE 0.9 % IV SOLN
250.0000 mL | Freq: Once | INTRAVENOUS | Status: AC
  Administered 2014-08-03: 250 mL via INTRAVENOUS

## 2014-08-03 MED ORDER — ACETAMINOPHEN 325 MG PO TABS
650.0000 mg | ORAL_TABLET | Freq: Once | ORAL | Status: AC
  Administered 2014-08-03: 650 mg via ORAL

## 2014-08-03 MED ORDER — SODIUM CHLORIDE 0.9 % IJ SOLN
10.0000 mL | INTRAMUSCULAR | Status: AC | PRN
  Administered 2014-08-03: 10 mL
  Filled 2014-08-03: qty 10

## 2014-08-03 NOTE — Progress Notes (Signed)
While hanging first unit of blood on patient RN was going to clear air from tubing.  When RN opened door of channel, channel fell off of main pump.  IV blood tubing was pinched and a hole punched in the tubing.  Blood spurted out so that unit of blood had to be taken down and returned to blood bank.  Pt did not receive any of this unit of blood and was not affected by the incident.  Second unit was subsequently hung and order obtained to crossmatch and transfuse another unit.

## 2014-08-03 NOTE — Patient Instructions (Signed)

## 2014-08-03 NOTE — Patient Instructions (Signed)

## 2014-08-04 LAB — TYPE AND SCREEN
ABO/RH(D): O POS
Antibody Screen: NEGATIVE
Unit division: 0
Unit division: 0
Unit division: 0

## 2014-08-07 ENCOUNTER — Ambulatory Visit (HOSPITAL_COMMUNITY)
Admission: RE | Admit: 2014-08-07 | Discharge: 2014-08-07 | Disposition: A | Discharge status: Home / Self Care | Payer: Medicare Other | Source: Ambulatory Visit | Attending: Oncology | Admitting: Oncology

## 2014-08-07 DIAGNOSIS — C9210 Chronic myeloid leukemia, BCR/ABL-positive, not having achieved remission: Secondary | ICD-10-CM | POA: Insufficient documentation

## 2014-08-07 DIAGNOSIS — D649 Anemia, unspecified: Secondary | ICD-10-CM

## 2014-08-07 DIAGNOSIS — D630 Anemia in neoplastic disease: Secondary | ICD-10-CM

## 2014-08-10 ENCOUNTER — Ambulatory Visit (HOSPITAL_BASED_OUTPATIENT_CLINIC_OR_DEPARTMENT_OTHER): Payer: Medicare Other

## 2014-08-10 ENCOUNTER — Ambulatory Visit: Payer: Medicare Other

## 2014-08-10 ENCOUNTER — Other Ambulatory Visit (HOSPITAL_BASED_OUTPATIENT_CLINIC_OR_DEPARTMENT_OTHER): Payer: Medicare Other

## 2014-08-10 VITALS — BP 145/65 | HR 64 | Temp 98.5°F

## 2014-08-10 DIAGNOSIS — C9200 Acute myeloblastic leukemia, not having achieved remission: Secondary | ICD-10-CM | POA: Diagnosis not present

## 2014-08-10 DIAGNOSIS — Z452 Encounter for adjustment and management of vascular access device: Secondary | ICD-10-CM

## 2014-08-10 DIAGNOSIS — Z95828 Presence of other vascular implants and grafts: Secondary | ICD-10-CM

## 2014-08-10 LAB — CBC WITH DIFFERENTIAL/PLATELET
BASO%: 0.7 % (ref 0.0–2.0)
BASOS ABS: 0 10*3/uL (ref 0.0–0.1)
EOS%: 3.1 % (ref 0.0–7.0)
Eosinophils Absolute: 0 10*3/uL (ref 0.0–0.5)
HEMATOCRIT: 27.7 % — AB (ref 38.4–49.9)
HEMOGLOBIN: 9 g/dL — AB (ref 13.0–17.1)
LYMPH#: 0.7 10*3/uL — AB (ref 0.9–3.3)
LYMPH%: 68.5 % — ABNORMAL HIGH (ref 14.0–49.0)
MCH: 32 pg (ref 27.2–33.4)
MCHC: 32.4 g/dL (ref 32.0–36.0)
MCV: 98.8 fL — AB (ref 79.3–98.0)
MONO#: 0 10*3/uL — ABNORMAL LOW (ref 0.1–0.9)
MONO%: 2 % (ref 0.0–14.0)
NEUT#: 0.3 10*3/uL — CL (ref 1.5–6.5)
NEUT%: 25.7 % — ABNORMAL LOW (ref 39.0–75.0)
PLATELETS: 46 10*3/uL — AB (ref 140–400)
RBC: 2.81 10*6/uL — ABNORMAL LOW (ref 4.20–5.82)
RDW: 22.5 % — ABNORMAL HIGH (ref 11.0–14.6)
WBC: 1 10*3/uL — ABNORMAL LOW (ref 4.0–10.3)

## 2014-08-10 LAB — COMPREHENSIVE METABOLIC PANEL (CC13)
ALK PHOS: 63 U/L (ref 40–150)
AST: 9 U/L (ref 5–34)
Albumin: 2.7 g/dL — ABNORMAL LOW (ref 3.5–5.0)
Anion Gap: 9 mEq/L (ref 3–11)
BUN: 13.5 mg/dL (ref 7.0–26.0)
CO2: 28 meq/L (ref 22–29)
CREATININE: 0.8 mg/dL (ref 0.7–1.3)
Calcium: 8.6 mg/dL (ref 8.4–10.4)
Chloride: 104 mEq/L (ref 98–109)
EGFR: 88 mL/min/{1.73_m2} — ABNORMAL LOW (ref >90)
Glucose: 94 mg/dl (ref 70–140)
Potassium: 3.6 mEq/L (ref 3.5–5.1)
Sodium: 141 mEq/L (ref 136–145)
TOTAL PROTEIN: 5.8 g/dL — AB (ref 6.4–8.3)
Total Bilirubin: 0.88 mg/dL (ref 0.20–1.20)

## 2014-08-10 LAB — MAGNESIUM (CC13): MAGNESIUM: 2.2 mg/dL (ref 1.5–2.5)

## 2014-08-10 LAB — HOLD TUBE, BLOOD BANK

## 2014-08-10 MED ORDER — HEPARIN SOD (PORK) LOCK FLUSH 100 UNIT/ML IV SOLN
500.0000 [IU] | Freq: Once | INTRAVENOUS | Status: AC
  Administered 2014-08-10: 500 [IU] via INTRAVENOUS
  Filled 2014-08-10: qty 5

## 2014-08-10 MED ORDER — SODIUM CHLORIDE 0.9 % IJ SOLN
10.0000 mL | INTRAMUSCULAR | Status: DC | PRN
  Administered 2014-08-10: 10 mL via INTRAVENOUS
  Filled 2014-08-10: qty 10

## 2014-08-10 NOTE — Progress Notes (Signed)
HGB 9.0 this am, platelets 43K. No need for transfusion per Dr. Alen Blew. Patient went home from lab.

## 2014-08-10 NOTE — Patient Instructions (Signed)

## 2014-08-12 ENCOUNTER — Ambulatory Visit (HOSPITAL_COMMUNITY): Payer: Medicare Other | Attending: Cardiology | Admitting: Radiology

## 2014-08-12 DIAGNOSIS — I5022 Chronic systolic (congestive) heart failure: Secondary | ICD-10-CM | POA: Diagnosis present

## 2014-08-12 DIAGNOSIS — C9200 Acute myeloblastic leukemia, not having achieved remission: Secondary | ICD-10-CM

## 2014-08-12 DIAGNOSIS — R55 Syncope and collapse: Secondary | ICD-10-CM | POA: Diagnosis not present

## 2014-08-12 DIAGNOSIS — R0789 Other chest pain: Secondary | ICD-10-CM

## 2014-08-12 MED ORDER — REGADENOSON 0.4 MG/5ML IV SOLN
0.4000 mg | Freq: Once | INTRAVENOUS | Status: AC
  Administered 2014-08-12: 0.4 mg via INTRAVENOUS

## 2014-08-12 MED ORDER — TECHNETIUM TC 99M SESTAMIBI GENERIC - CARDIOLITE
33.0000 | Freq: Once | INTRAVENOUS | Status: AC | PRN
  Administered 2014-08-12: 33 via INTRAVENOUS

## 2014-08-12 MED ORDER — TECHNETIUM TC 99M SESTAMIBI GENERIC - CARDIOLITE
11.0000 | Freq: Once | INTRAVENOUS | Status: AC | PRN
  Administered 2014-08-12: 11 via INTRAVENOUS

## 2014-08-12 NOTE — Progress Notes (Signed)
[facility] [address] [place], [place] [address] [phone]    Cardiology Nuclear Med Study  [name] is a 74 y.o. male     MRN : [id number]     DOB: Oct 01, 1940  Procedure Date: 08/12/2014  Nuclear Med Background Indication for Stress Test:  Evaluation for Ischemia and 07-28-2014 Echo = EF 20-25% History:  Afib Cardiac Risk Factors: Hypertension  Symptoms:  Chest Pressure.  (last date of chest discomfort was in December), Near Syncope and SOB   Nuclear Pre-Procedure Caffeine/Decaff Intake:  None> 12 hrs NPO After: 6:30am   Lungs:  clear O2 Sat: 99% on room air. IV 0.9% NS with Angio Cath:  22g  IV Site: R Hand x 1, tolerated well IV Started by:  Irven Baltimore, RN  Chest Size (in):  44 Cup Size: n/a  Height: 5\' 9"  (1.753 m)  Weight:  177 lb (80.287 kg)  BMI:  Body mass index is 26.13 kg/(m^2). Tech Comments:  Patient held Coreg this am. Irven Baltimore, RN    Nuclear Med Study 1 or 2 day study: 1 day  Stress Test Type:  Carlton Adam  Reading MD: N/A  Order Authorizing Provider:  Candee Furbish, MD  Resting Radionuclide: Technetium 65m Sestamibi  Resting Radionuclide Dose: 11.0 mCi   Stress Radionuclide:  Technetium 65m Sestamibi  Stress Radionuclide Dose: 33.0 mCi           Stress Protocol Rest HR: 68 Stress HR: 85  Rest BP: 153/85 Stress BP: 146/80  Exercise Time (min): n/a METS: n/a   Predicted Max HR: 146 bpm % Max HR: 58.22 bpm Rate Pressure Product: 13005   Dose of Adenosine (mg):  n/a Dose of Lexiscan: 0.4 mg  Dose of Atropine (mg): n/a Dose of Dobutamine: n/a mcg/kg/min (at max HR)  Stress Test Technologist: Glade Lloyd, BS-ES  Nuclear Technologist:  Earl Many, CNMT     Rest Procedure:  Myocardial perfusion imaging was performed at rest 45 minutes following the intravenous administration of Technetium 44m Sestamibi. Rest ECG: NSR with non-specific ST-T wave changes  Stress Procedure:  The patient received IV Lexiscan  0.4 mg over 15-seconds.  Technetium 88m Sestamibi injected at 30-seconds.  Quantitative spect images were obtained after a 45 minute delay.  During the infusion of Lexican the patient complained of SOB, stomach upset and lightheadedness.  These symptoms began to resolve in recovery.  Stress ECG: No significant change from baseline ECG  QPS Raw Data Images:  There is interference from nuclear activity from structures below the diaphragm. This does not affect the ability to read the study. Stress Images:  There is decreased uptake in the inferior wall. Rest Images:  There is decreased uptake in the inferior wall. Subtraction (SDS):  These findings are consistent with ischemia. Transient Ischemic Dilatation (Normal <1.22):  0.96 Lung/Heart Ratio (Normal <0.45):  0.36  Quantitative Gated Spect Images QGS EDV:  229 ml QGS ESV:  155 ml  Impression Exercise Capacity:  Lexiscan with no exercise. BP Response:  Normal blood pressure response. Clinical Symptoms:  There is dyspnea. ECG Impression:  No significant ST segment change suggestive of ischemia. Comparison with Prior Nuclear Study: No previous nuclear study performed  Overall Impression:  Intermediate risk stress nuclear study.  There is a medium size defect of moderate severity involving the apex and apical inferior and apical lateral segments. There is partial reversibility.  There is LV systolic dysfunction with EF 32% and apical hypokinesis.  LV  Ejection Fraction: 32%.  LV Wall Motion:  Apical hypokinesis  Darlin Coco MD

## 2014-08-17 ENCOUNTER — Other Ambulatory Visit (HOSPITAL_BASED_OUTPATIENT_CLINIC_OR_DEPARTMENT_OTHER): Payer: Medicare Other

## 2014-08-17 ENCOUNTER — Ambulatory Visit (HOSPITAL_BASED_OUTPATIENT_CLINIC_OR_DEPARTMENT_OTHER): Payer: Medicare Other

## 2014-08-17 VITALS — BP 131/59 | HR 60 | Temp 98.1°F | Resp 18

## 2014-08-17 DIAGNOSIS — C9200 Acute myeloblastic leukemia, not having achieved remission: Secondary | ICD-10-CM

## 2014-08-17 DIAGNOSIS — C9210 Chronic myeloid leukemia, BCR/ABL-positive, not having achieved remission: Secondary | ICD-10-CM | POA: Diagnosis not present

## 2014-08-17 DIAGNOSIS — D630 Anemia in neoplastic disease: Secondary | ICD-10-CM

## 2014-08-17 LAB — COMPREHENSIVE METABOLIC PANEL (CC13)
ALT: <6 U/L (ref 0–55)
AST: 8 U/L (ref 5–34)
Albumin: 2.7 g/dL — ABNORMAL LOW (ref 3.5–5.0)
Alkaline Phosphatase: 61 U/L (ref 40–150)
Anion Gap: 8 mEq/L (ref 3–11)
BUN: 13.6 mg/dL (ref 7.0–26.0)
CALCIUM: 8.3 mg/dL — AB (ref 8.4–10.4)
CO2: 27 mEq/L (ref 22–29)
CREATININE: 0.8 mg/dL (ref 0.7–1.3)
Chloride: 104 mEq/L (ref 98–109)
EGFR: 86 mL/min/{1.73_m2} — ABNORMAL LOW (ref >90)
Glucose: 128 mg/dl (ref 70–140)
POTASSIUM: 3.9 meq/L (ref 3.5–5.1)
Sodium: 140 mEq/L (ref 136–145)
Total Bilirubin: 0.72 mg/dL (ref 0.20–1.20)
Total Protein: 6 g/dL — ABNORMAL LOW (ref 6.4–8.3)

## 2014-08-17 LAB — HOLD TUBE, BLOOD BANK

## 2014-08-17 LAB — MAGNESIUM (CC13): Magnesium: 2.1 mg/dl (ref 1.5–2.5)

## 2014-08-17 LAB — CBC WITH DIFFERENTIAL/PLATELET
BASO%: 0 % (ref 0.0–2.0)
Basophils Absolute: 0 10*3/uL (ref 0.0–0.1)
EOS%: 2.1 % (ref 0.0–7.0)
Eosinophils Absolute: 0 10*3/uL (ref 0.0–0.5)
HEMATOCRIT: 25.5 % — AB (ref 38.4–49.9)
HGB: 8.2 g/dL — ABNORMAL LOW (ref 13.0–17.1)
LYMPH#: 0.5 10*3/uL — AB (ref 0.9–3.3)
LYMPH%: 53.6 % — ABNORMAL HIGH (ref 14.0–49.0)
MCH: 32.7 pg (ref 27.2–33.4)
MCHC: 32.2 g/dL (ref 32.0–36.0)
MCV: 101.6 fL — ABNORMAL HIGH (ref 79.3–98.0)
MONO#: 0 10*3/uL — AB (ref 0.1–0.9)
MONO%: 1 % (ref 0.0–14.0)
NEUT%: 43.3 % (ref 39.0–75.0)
NEUTROS ABS: 0.4 10*3/uL — AB (ref 1.5–6.5)
Platelets: 121 10*3/uL — ABNORMAL LOW (ref 140–400)
RBC: 2.51 10*6/uL — AB (ref 4.20–5.82)
RDW: 20.4 % — ABNORMAL HIGH (ref 11.0–14.6)
WBC: 1 10*3/uL — AB (ref 4.0–10.3)

## 2014-08-17 LAB — PREPARE RBC (CROSSMATCH)

## 2014-08-17 MED ORDER — DIPHENHYDRAMINE HCL 25 MG PO CAPS
25.0000 mg | ORAL_CAPSULE | Freq: Once | ORAL | Status: AC
  Administered 2014-08-17: 25 mg via ORAL

## 2014-08-17 MED ORDER — ACETAMINOPHEN 325 MG PO TABS
ORAL_TABLET | ORAL | Status: AC
  Filled 2014-08-17: qty 2

## 2014-08-17 MED ORDER — DIPHENHYDRAMINE HCL 25 MG PO CAPS
ORAL_CAPSULE | ORAL | Status: AC
  Filled 2014-08-17: qty 1

## 2014-08-17 MED ORDER — ACETAMINOPHEN 325 MG PO TABS
650.0000 mg | ORAL_TABLET | Freq: Once | ORAL | Status: AC
  Administered 2014-08-17: 650 mg via ORAL

## 2014-08-17 MED ORDER — SODIUM CHLORIDE 0.9 % IV SOLN
250.0000 mL | Freq: Once | INTRAVENOUS | Status: AC
  Administered 2014-08-17: 250 mL via INTRAVENOUS

## 2014-08-17 MED ORDER — HEPARIN SOD (PORK) LOCK FLUSH 100 UNIT/ML IV SOLN
500.0000 [IU] | Freq: Every day | INTRAVENOUS | Status: AC | PRN
  Administered 2014-08-17: 500 [IU]
  Filled 2014-08-17: qty 5

## 2014-08-17 MED ORDER — SODIUM CHLORIDE 0.9 % IJ SOLN
10.0000 mL | INTRAMUSCULAR | Status: AC | PRN
  Administered 2014-08-17: 10 mL
  Filled 2014-08-17: qty 10

## 2014-08-17 MED ORDER — FUROSEMIDE 10 MG/ML IJ SOLN
20.0000 mg | Freq: Once | INTRAMUSCULAR | Status: AC
  Administered 2014-08-17: 20 mg via INTRAVENOUS

## 2014-08-17 NOTE — Patient Instructions (Signed)

## 2014-08-18 ENCOUNTER — Telehealth: Payer: Self-pay | Admitting: Cardiology

## 2014-08-18 ENCOUNTER — Encounter: Payer: Self-pay | Admitting: *Deleted

## 2014-08-18 LAB — TYPE AND SCREEN
ABO/RH(D): O POS
Antibody Screen: NEGATIVE
Unit division: 0
Unit division: 0

## 2014-08-18 NOTE — Telephone Encounter (Signed)
Left message to call back for results

## 2014-08-18 NOTE — Telephone Encounter (Addendum)
New message     Want stress test results.  Best time to call is between 11-12.

## 2014-08-18 NOTE — Telephone Encounter (Signed)
Dr Marlou Porch spoke with Claiborne Billings, NP about pts stress test results and his upcoming surgical procedure.  He has been given clearance for this.  A letter is being faxed to [phone]

## 2014-08-18 NOTE — Telephone Encounter (Signed)
Left message for pt to call back for results.

## 2014-08-18 NOTE — Telephone Encounter (Signed)
Attempted to contact pt again however received a voicemail  -  I did not leave another message.

## 2014-08-19 NOTE — Telephone Encounter (Signed)
Reviewed results of nuc study with pt who states understanding

## 2014-08-19 NOTE — Telephone Encounter (Signed)
Follow up ° ° ° ° ° ° ° ° ° °Pt returning nurse call  °

## 2014-08-21 ENCOUNTER — Telehealth: Payer: Self-pay | Admitting: Cardiology

## 2014-08-21 NOTE — Telephone Encounter (Signed)
New problem   Want to know status of clearance  For sx that is sched for 08/25/14. Waiting on fax and haven't received it please fax to [phone].

## 2014-08-21 NOTE — Telephone Encounter (Signed)
Clearance letter was faxed 08/18/14 to the number given.  Refaxed letter of clearance to both [phone] and [phone].  Spoke with office and they are aware to be looking for the letter.

## 2014-08-26 ENCOUNTER — Other Ambulatory Visit: Payer: Self-pay | Admitting: *Deleted

## 2014-08-26 DIAGNOSIS — I5031 Acute diastolic (congestive) heart failure: Secondary | ICD-10-CM

## 2014-08-26 MED ORDER — ISOSORBIDE MONONITRATE ER 30 MG PO TB24: 30.0000 mg | ORAL_TABLET | Freq: Every day | ORAL | Status: AC

## 2014-08-26 MED ORDER — CARVEDILOL 6.25 MG PO TABS: 6.2500 mg | ORAL_TABLET | Freq: Two times a day (BID) | ORAL | Status: AC

## 2014-09-02 ENCOUNTER — Telehealth: Payer: Self-pay | Admitting: Cardiology

## 2014-09-02 MED ORDER — FUROSEMIDE 20 MG PO TABS: 20.0000 mg | ORAL_TABLET | Freq: Every day | ORAL | Status: AC

## 2014-09-02 NOTE — Telephone Encounter (Signed)
Left message on [name]'s voicemail to call back and that I would call pt.  I spoke with pt. He reports he had out patient circumcision yesterday. Procedure went well.  Received IV fluids during procedure. No bowel movement since Monday. Is urinating OK post procedure. No swelling. No shortness of breath. Weight was 176 lbs today. Yesterday was 170 lbs.  Dr. Marlou Porch' note indicates pt on Lasix and pt reports he takes Lasix 20 mg daily every afternoon.  Also received fax of recent weights from [facility].  Will review with provider in office.

## 2014-09-02 NOTE — Telephone Encounter (Signed)
Reviewed with Ellen Henri, PA and pt should increase furosemide to 40 mg by mouth daily for 3 days and increase potassium to 20 meq by mouth daily for 3 days and then return to regular dose.  Needs to decrease salt intake.  Call office if weight does not return to normal or develops swelling or shortness of breath. I spoke with pt and gave him these instructions.  He needs refill of lasix. Will send to [facility] drug

## 2014-09-02 NOTE — Telephone Encounter (Signed)
New message     Pt has had a 6lbs wt increase in 2 days.  He did have a procedure yesterday and received IV fluids, but pt says he has had a little more sodium in the last couple of days.  Nurse is faxing this info.

## 2014-09-07 NOTE — Telephone Encounter (Signed)
Agree with plan.  SKAINS, MARK, MD  

## 2014-09-14 ENCOUNTER — Telehealth: Payer: Self-pay | Admitting: *Deleted

## 2014-09-14 ENCOUNTER — Other Ambulatory Visit: Payer: Self-pay | Admitting: *Deleted

## 2014-09-14 ENCOUNTER — Ambulatory Visit (HOSPITAL_BASED_OUTPATIENT_CLINIC_OR_DEPARTMENT_OTHER): Payer: Medicare Other

## 2014-09-14 ENCOUNTER — Telehealth: Payer: Self-pay | Admitting: Oncology

## 2014-09-14 ENCOUNTER — Other Ambulatory Visit (HOSPITAL_BASED_OUTPATIENT_CLINIC_OR_DEPARTMENT_OTHER): Payer: Medicare Other

## 2014-09-14 DIAGNOSIS — C9200 Acute myeloblastic leukemia, not having achieved remission: Secondary | ICD-10-CM

## 2014-09-14 DIAGNOSIS — Z95828 Presence of other vascular implants and grafts: Secondary | ICD-10-CM

## 2014-09-14 LAB — MAGNESIUM (CC13): Magnesium: 2.2 mg/dl (ref 1.5–2.5)

## 2014-09-14 LAB — CBC WITH DIFFERENTIAL/PLATELET
BASO%: 0.3 % (ref 0.0–2.0)
Basophils Absolute: 0 10*3/uL (ref 0.0–0.1)
EOS%: 1.7 % (ref 0.0–7.0)
Eosinophils Absolute: 0.1 10*3/uL (ref 0.0–0.5)
HCT: 28.9 % — ABNORMAL LOW (ref 38.4–49.9)
HGB: 9.7 g/dL — ABNORMAL LOW (ref 13.0–17.1)
LYMPH#: 0.9 10*3/uL (ref 0.9–3.3)
LYMPH%: 30.2 % (ref 14.0–49.0)
MCH: 33.7 pg — ABNORMAL HIGH (ref 27.2–33.4)
MCHC: 33.6 g/dL (ref 32.0–36.0)
MCV: 100.3 fL — AB (ref 79.3–98.0)
MONO#: 0.1 10*3/uL (ref 0.1–0.9)
MONO%: 3.4 % (ref 0.0–14.0)
NEUT#: 1.9 10*3/uL (ref 1.5–6.5)
NEUT%: 64.4 % (ref 39.0–75.0)
Platelets: 80 10*3/uL — ABNORMAL LOW (ref 140–400)
RBC: 2.88 10*6/uL — AB (ref 4.20–5.82)
RDW: 20.2 % — AB (ref 11.0–14.6)
WBC: 3 10*3/uL — ABNORMAL LOW (ref 4.0–10.3)

## 2014-09-14 LAB — COMPREHENSIVE METABOLIC PANEL (CC13)
ALK PHOS: 72 U/L (ref 40–150)
ALT: 7 U/L (ref 0–55)
AST: 11 U/L (ref 5–34)
Albumin: 3 g/dL — ABNORMAL LOW (ref 3.5–5.0)
Anion Gap: 8 mEq/L (ref 3–11)
BUN: 17.5 mg/dL (ref 7.0–26.0)
CO2: 25 mEq/L (ref 22–29)
Calcium: 8.3 mg/dL — ABNORMAL LOW (ref 8.4–10.4)
Chloride: 108 mEq/L (ref 98–109)
Creatinine: 0.8 mg/dL (ref 0.7–1.3)
EGFR: 87 mL/min/{1.73_m2} — ABNORMAL LOW (ref >90)
Glucose: 97 mg/dl (ref 70–140)
Potassium: 4.1 mEq/L (ref 3.5–5.1)
SODIUM: 141 meq/L (ref 136–145)
TOTAL PROTEIN: 5.7 g/dL — AB (ref 6.4–8.3)
Total Bilirubin: 0.67 mg/dL (ref 0.20–1.20)

## 2014-09-14 LAB — HOLD TUBE, BLOOD BANK

## 2014-09-14 MED ORDER — SODIUM CHLORIDE 0.9 % IJ SOLN
10.0000 mL | INTRAMUSCULAR | Status: DC | PRN
  Administered 2014-09-14: 10 mL via INTRAVENOUS
  Filled 2014-09-14: qty 10

## 2014-09-14 MED ORDER — HEPARIN SOD (PORK) LOCK FLUSH 100 UNIT/ML IV SOLN
500.0000 [IU] | Freq: Once | INTRAVENOUS | Status: AC
  Administered 2014-09-14: 500 [IU] via INTRAVENOUS
  Filled 2014-09-14: qty 5

## 2014-09-14 NOTE — Patient Instructions (Signed)

## 2014-09-14 NOTE — Telephone Encounter (Signed)
[name] from [facility] left message 5/2 stating patient was being discharged over the weekend and would need lab in our office on Monday morning. Per [name] she left a previous message. Message forwarded to triage.

## 2014-09-14 NOTE — Telephone Encounter (Signed)
Received VM from [place] in scheduling and attached message from [facility]. Pt. discharged over the weekend and needs lab orders/appt for today. Dr. Hazeline Junker nurse, Dixie, RN to do pof.

## 2014-09-14 NOTE — Telephone Encounter (Signed)
per pof to sh pt appt-cld & spoke to pt and adv of 5/16 appt-adv pt to come by scheduling to get updated sch-pt understood

## 2014-09-14 NOTE — Telephone Encounter (Signed)
per pof to add pt on lab/flush-pt here-add appts

## 2014-09-21 ENCOUNTER — Encounter: Payer: Self-pay | Admitting: *Deleted

## 2014-09-21 ENCOUNTER — Ambulatory Visit (HOSPITAL_BASED_OUTPATIENT_CLINIC_OR_DEPARTMENT_OTHER): Payer: Medicare Other

## 2014-09-21 ENCOUNTER — Other Ambulatory Visit (HOSPITAL_BASED_OUTPATIENT_CLINIC_OR_DEPARTMENT_OTHER): Payer: Medicare Other

## 2014-09-21 DIAGNOSIS — Z95828 Presence of other vascular implants and grafts: Secondary | ICD-10-CM

## 2014-09-21 DIAGNOSIS — C9200 Acute myeloblastic leukemia, not having achieved remission: Secondary | ICD-10-CM

## 2014-09-21 LAB — CBC WITH DIFFERENTIAL/PLATELET
BASO%: 0.3 % (ref 0.0–2.0)
Basophils Absolute: 0 10*3/uL (ref 0.0–0.1)
EOS%: 1.1 % (ref 0.0–7.0)
Eosinophils Absolute: 0 10*3/uL (ref 0.0–0.5)
HCT: 25 % — ABNORMAL LOW (ref 38.4–49.9)
HGB: 8.3 g/dL — ABNORMAL LOW (ref 13.0–17.1)
LYMPH%: 39.7 % (ref 14.0–49.0)
MCH: 33.7 pg — AB (ref 27.2–33.4)
MCHC: 33.2 g/dL (ref 32.0–36.0)
MCV: 101.7 fL — AB (ref 79.3–98.0)
MONO#: 0 10*3/uL — AB (ref 0.1–0.9)
MONO%: 1.8 % (ref 0.0–14.0)
NEUT%: 57.1 % (ref 39.0–75.0)
NEUTROS ABS: 1 10*3/uL — AB (ref 1.5–6.5)
PLATELETS: 30 10*3/uL — AB (ref 140–400)
RBC: 2.46 10*6/uL — ABNORMAL LOW (ref 4.20–5.82)
RDW: 22.6 % — AB (ref 11.0–14.6)
WBC: 1.7 10*3/uL — ABNORMAL LOW (ref 4.0–10.3)
lymph#: 0.7 10*3/uL — ABNORMAL LOW (ref 0.9–3.3)

## 2014-09-21 LAB — COMPREHENSIVE METABOLIC PANEL (CC13)
ALT: 7 U/L (ref 0–55)
AST: 11 U/L (ref 5–34)
Albumin: 2.9 g/dL — ABNORMAL LOW (ref 3.5–5.0)
Alkaline Phosphatase: 68 U/L (ref 40–150)
Anion Gap: 11 mEq/L (ref 3–11)
BUN: 10.5 mg/dL (ref 7.0–26.0)
CO2: 23 mEq/L (ref 22–29)
Calcium: 7.9 mg/dL — ABNORMAL LOW (ref 8.4–10.4)
Chloride: 111 mEq/L — ABNORMAL HIGH (ref 98–109)
Creatinine: 0.8 mg/dL (ref 0.7–1.3)
EGFR: 88 mL/min/{1.73_m2} — ABNORMAL LOW (ref >90)
Glucose: 107 mg/dl (ref 70–140)
Potassium: 2.9 mEq/L — CL (ref 3.5–5.1)
Sodium: 145 mEq/L (ref 136–145)
Total Bilirubin: 0.67 mg/dL (ref 0.20–1.20)
Total Protein: 5.5 g/dL — ABNORMAL LOW (ref 6.4–8.3)

## 2014-09-21 LAB — MAGNESIUM (CC13): Magnesium: 2.1 mg/dl (ref 1.5–2.5)

## 2014-09-21 LAB — HOLD TUBE, BLOOD BANK

## 2014-09-21 MED ORDER — SODIUM CHLORIDE 0.9 % IJ SOLN
10.0000 mL | INTRAMUSCULAR | Status: DC | PRN
  Administered 2014-09-21: 10 mL via INTRAVENOUS
  Filled 2014-09-21: qty 10

## 2014-09-21 MED ORDER — HEPARIN SOD (PORK) LOCK FLUSH 100 UNIT/ML IV SOLN
500.0000 [IU] | Freq: Once | INTRAVENOUS | Status: AC
  Administered 2014-09-21: 500 [IU] via INTRAVENOUS
  Filled 2014-09-21: qty 5

## 2014-09-21 NOTE — Progress Notes (Unsigned)
Labs faxed to vivian grubbs rn @ [facility] [phone]

## 2014-09-28 ENCOUNTER — Other Ambulatory Visit: Payer: Self-pay | Admitting: Medical Oncology

## 2014-09-28 ENCOUNTER — Encounter: Payer: Self-pay | Admitting: *Deleted

## 2014-09-28 ENCOUNTER — Other Ambulatory Visit (HOSPITAL_BASED_OUTPATIENT_CLINIC_OR_DEPARTMENT_OTHER): Payer: Medicare Other

## 2014-09-28 ENCOUNTER — Ambulatory Visit (HOSPITAL_BASED_OUTPATIENT_CLINIC_OR_DEPARTMENT_OTHER): Payer: Non-veteran care

## 2014-09-28 ENCOUNTER — Ambulatory Visit: Payer: Non-veteran care

## 2014-09-28 ENCOUNTER — Ambulatory Visit (HOSPITAL_COMMUNITY)
Admission: RE | Admit: 2014-09-28 | Discharge: 2014-09-28 | Disposition: A | Discharge status: Home / Self Care | Payer: Medicare Other | Source: Ambulatory Visit | Attending: Oncology | Admitting: Oncology

## 2014-09-28 VITALS — BP 151/72 | HR 63 | Temp 98.1°F | Resp 18

## 2014-09-28 VITALS — BP 145/63 | HR 55 | Temp 97.9°F | Resp 18

## 2014-09-28 DIAGNOSIS — D6489 Other specified anemias: Secondary | ICD-10-CM

## 2014-09-28 DIAGNOSIS — D649 Anemia, unspecified: Secondary | ICD-10-CM

## 2014-09-28 DIAGNOSIS — C9210 Chronic myeloid leukemia, BCR/ABL-positive, not having achieved remission: Secondary | ICD-10-CM | POA: Insufficient documentation

## 2014-09-28 DIAGNOSIS — Z95828 Presence of other vascular implants and grafts: Secondary | ICD-10-CM

## 2014-09-28 DIAGNOSIS — C9200 Acute myeloblastic leukemia, not having achieved remission: Secondary | ICD-10-CM | POA: Diagnosis not present

## 2014-09-28 DIAGNOSIS — D630 Anemia in neoplastic disease: Secondary | ICD-10-CM

## 2014-09-28 LAB — COMPREHENSIVE METABOLIC PANEL (CC13)
ALT: <6 U/L (ref 0–55)
AST: 10 U/L (ref 5–34)
Albumin: 2.7 g/dL — ABNORMAL LOW (ref 3.5–5.0)
Alkaline Phosphatase: 57 U/L (ref 40–150)
Anion Gap: 10 meq/L (ref 3–11)
BUN: 10.7 mg/dL (ref 7.0–26.0)
CO2: 26 meq/L (ref 22–29)
Calcium: 7.8 mg/dL — ABNORMAL LOW (ref 8.4–10.4)
Chloride: 106 meq/L (ref 98–109)
Creatinine: 0.7 mg/dL (ref 0.7–1.3)
EGFR: >90 ml/min/1.73 m2
Glucose: 99 mg/dL (ref 70–140)
Potassium: 3 meq/L — CL (ref 3.5–5.1)
Sodium: 142 meq/L (ref 136–145)
Total Bilirubin: 0.73 mg/dL (ref 0.20–1.20)
Total Protein: 5.5 g/dL — ABNORMAL LOW (ref 6.4–8.3)

## 2014-09-28 LAB — CBC WITH DIFFERENTIAL/PLATELET
BASO%: 0 % (ref 0.0–2.0)
Basophils Absolute: 0 10e3/uL (ref 0.0–0.1)
EOS%: 1.2 % (ref 0.0–7.0)
Eosinophils Absolute: 0 10e3/uL (ref 0.0–0.5)
HCT: 21.8 % — ABNORMAL LOW (ref 38.4–49.9)
HGB: 7.3 g/dL — ABNORMAL LOW (ref 13.0–17.1)
LYMPH%: 68.3 % — ABNORMAL HIGH (ref 14.0–49.0)
MCH: 34.6 pg — ABNORMAL HIGH (ref 27.2–33.4)
MCHC: 33.5 g/dL (ref 32.0–36.0)
MCV: 103.3 fL — ABNORMAL HIGH (ref 79.3–98.0)
MONO#: 0 10e3/uL — ABNORMAL LOW (ref 0.1–0.9)
MONO%: 2.4 % (ref 0.0–14.0)
NEUT#: 0.2 10e3/uL — CL (ref 1.5–6.5)
NEUT%: 28.1 % — ABNORMAL LOW (ref 39.0–75.0)
Platelets: 31 10e3/uL — ABNORMAL LOW (ref 140–400)
RBC: 2.11 10e6/uL — ABNORMAL LOW (ref 4.20–5.82)
RDW: 21 % — ABNORMAL HIGH (ref 11.0–14.6)
WBC: 0.8 10e3/uL — CL (ref 4.0–10.3)
lymph#: 0.6 10e3/uL — ABNORMAL LOW (ref 0.9–3.3)

## 2014-09-28 LAB — HOLD TUBE, BLOOD BANK

## 2014-09-28 LAB — PREPARE RBC (CROSSMATCH)

## 2014-09-28 LAB — MAGNESIUM (CC13): Magnesium: 1.9 mg/dL (ref 1.5–2.5)

## 2014-09-28 MED ORDER — HEPARIN SOD (PORK) LOCK FLUSH 100 UNIT/ML IV SOLN
500.0000 [IU] | Freq: Every day | INTRAVENOUS | Status: AC | PRN
  Administered 2014-09-28: 500 [IU]
  Filled 2014-09-28: qty 5

## 2014-09-28 MED ORDER — ACETAMINOPHEN 325 MG PO TABS
650.0000 mg | ORAL_TABLET | Freq: Once | ORAL | Status: AC
  Administered 2014-09-28: 650 mg via ORAL

## 2014-09-28 MED ORDER — DIPHENHYDRAMINE HCL 25 MG PO CAPS
ORAL_CAPSULE | ORAL | Status: AC
  Filled 2014-09-28: qty 1

## 2014-09-28 MED ORDER — ACETAMINOPHEN 325 MG PO TABS
ORAL_TABLET | ORAL | Status: AC
  Filled 2014-09-28: qty 2

## 2014-09-28 MED ORDER — SODIUM CHLORIDE 0.9 % IV SOLN
250.0000 mL | Freq: Once | INTRAVENOUS | Status: AC
  Administered 2014-09-28: 250 mL via INTRAVENOUS

## 2014-09-28 MED ORDER — SODIUM CHLORIDE 0.9 % IJ SOLN
10.0000 mL | INTRAMUSCULAR | Status: AC | PRN
  Administered 2014-09-28: 10 mL
  Filled 2014-09-28: qty 10

## 2014-09-28 MED ORDER — DIPHENHYDRAMINE HCL 25 MG PO CAPS
25.0000 mg | ORAL_CAPSULE | Freq: Once | ORAL | Status: AC
  Administered 2014-09-28: 25 mg via ORAL

## 2014-09-28 MED ORDER — HEPARIN SOD (PORK) LOCK FLUSH 100 UNIT/ML IV SOLN
500.0000 [IU] | Freq: Once | INTRAVENOUS | Status: AC
  Administered 2014-09-28: 500 [IU] via INTRAVENOUS
  Filled 2014-09-28: qty 5

## 2014-09-28 MED ORDER — SODIUM CHLORIDE 0.9 % IJ SOLN
10.0000 mL | INTRAMUSCULAR | Status: DC | PRN
  Administered 2014-09-28: 10 mL via INTRAVENOUS
  Filled 2014-09-28: qty 10

## 2014-09-28 NOTE — Patient Instructions (Signed)

## 2014-09-28 NOTE — Progress Notes (Signed)
Per Randolm Idol, RN,  Pt is to return tomorrow for another unit of PRBCs.   Pt is not scheduled yet.  She states she will call pt in the morning w/ appt time.  Instructed pt to call us if he does not hear anything by 10 am.  He verbalized understanding.

## 2014-09-29 ENCOUNTER — Encounter: Payer: Self-pay | Admitting: *Deleted

## 2014-09-29 ENCOUNTER — Other Ambulatory Visit: Payer: Self-pay | Admitting: Medical Oncology

## 2014-09-29 ENCOUNTER — Telehealth: Payer: Self-pay | Admitting: Oncology

## 2014-09-29 ENCOUNTER — Telehealth: Payer: Self-pay | Admitting: *Deleted

## 2014-09-29 ENCOUNTER — Ambulatory Visit (HOSPITAL_BASED_OUTPATIENT_CLINIC_OR_DEPARTMENT_OTHER): Payer: Medicare Other

## 2014-09-29 VITALS — BP 160/64 | HR 66 | Temp 98.3°F | Resp 14

## 2014-09-29 DIAGNOSIS — D649 Anemia, unspecified: Secondary | ICD-10-CM

## 2014-09-29 DIAGNOSIS — D630 Anemia in neoplastic disease: Secondary | ICD-10-CM

## 2014-09-29 DIAGNOSIS — C9200 Acute myeloblastic leukemia, not having achieved remission: Secondary | ICD-10-CM

## 2014-09-29 DIAGNOSIS — C9210 Chronic myeloid leukemia, BCR/ABL-positive, not having achieved remission: Secondary | ICD-10-CM | POA: Diagnosis not present

## 2014-09-29 LAB — PREPARE RBC (CROSSMATCH)

## 2014-09-29 MED ORDER — DIPHENHYDRAMINE HCL 25 MG PO CAPS
ORAL_CAPSULE | ORAL | Status: AC
  Filled 2014-09-29: qty 1

## 2014-09-29 MED ORDER — ACETAMINOPHEN 325 MG PO TABS
ORAL_TABLET | ORAL | Status: AC
  Filled 2014-09-29: qty 2

## 2014-09-29 MED ORDER — SODIUM CHLORIDE 0.9 % IV SOLN
250.0000 mL | Freq: Once | INTRAVENOUS | Status: AC
  Administered 2014-09-29: 250 mL via INTRAVENOUS

## 2014-09-29 MED ORDER — SODIUM CHLORIDE 0.9 % IJ SOLN
10.0000 mL | INTRAMUSCULAR | Status: AC | PRN
  Administered 2014-09-29: 10 mL
  Filled 2014-09-29: qty 10

## 2014-09-29 MED ORDER — HEPARIN SOD (PORK) LOCK FLUSH 100 UNIT/ML IV SOLN
500.0000 [IU] | Freq: Every day | INTRAVENOUS | Status: AC | PRN
  Administered 2014-09-29: 500 [IU]
  Filled 2014-09-29: qty 5

## 2014-09-29 MED ORDER — DIPHENHYDRAMINE HCL 25 MG PO CAPS
25.0000 mg | ORAL_CAPSULE | Freq: Once | ORAL | Status: AC
  Administered 2014-09-29: 25 mg via ORAL

## 2014-09-29 MED ORDER — ACETAMINOPHEN 325 MG PO TABS
650.0000 mg | ORAL_TABLET | Freq: Once | ORAL | Status: AC
  Administered 2014-09-29: 650 mg via ORAL

## 2014-09-29 NOTE — Telephone Encounter (Signed)
Called and gave patient appt for today

## 2014-09-29 NOTE — Patient Instructions (Signed)

## 2014-09-29 NOTE — Telephone Encounter (Signed)
lvm for pt regarding to 5.26 appt.Marland KitchenMarland Kitchen

## 2014-09-30 ENCOUNTER — Encounter: Payer: Self-pay | Admitting: *Deleted

## 2014-09-30 LAB — TYPE AND SCREEN
ABO/RH(D): O POS
ANTIBODY SCREEN: NEGATIVE
Unit division: 0
Unit division: 0

## 2014-10-01 ENCOUNTER — Other Ambulatory Visit (HOSPITAL_BASED_OUTPATIENT_CLINIC_OR_DEPARTMENT_OTHER): Payer: Non-veteran care

## 2014-10-01 ENCOUNTER — Ambulatory Visit (HOSPITAL_BASED_OUTPATIENT_CLINIC_OR_DEPARTMENT_OTHER): Payer: Medicare Other

## 2014-10-01 DIAGNOSIS — C9200 Acute myeloblastic leukemia, not having achieved remission: Secondary | ICD-10-CM

## 2014-10-01 DIAGNOSIS — Z95828 Presence of other vascular implants and grafts: Secondary | ICD-10-CM

## 2014-10-01 LAB — COMPREHENSIVE METABOLIC PANEL (CC13)
ALT: <6 U/L (ref 0–55)
AST: 11 U/L (ref 5–34)
Albumin: 2.8 g/dL — ABNORMAL LOW (ref 3.5–5.0)
Alkaline Phosphatase: 65 U/L (ref 40–150)
Anion Gap: 9 mEq/L (ref 3–11)
BUN: 14 mg/dL (ref 7.0–26.0)
CHLORIDE: 107 meq/L (ref 98–109)
CO2: 26 meq/L (ref 22–29)
CREATININE: 0.8 mg/dL (ref 0.7–1.3)
Calcium: 8.1 mg/dL — ABNORMAL LOW (ref 8.4–10.4)
EGFR: 88 mL/min/{1.73_m2} — ABNORMAL LOW (ref >90)
Glucose: 120 mg/dl (ref 70–140)
Potassium: 3.2 mEq/L — ABNORMAL LOW (ref 3.5–5.1)
Sodium: 142 mEq/L (ref 136–145)
TOTAL PROTEIN: 5.9 g/dL — AB (ref 6.4–8.3)
Total Bilirubin: 0.69 mg/dL (ref 0.20–1.20)

## 2014-10-01 LAB — CBC WITH DIFFERENTIAL/PLATELET
BASO%: 0.4 % (ref 0.0–2.0)
Basophils Absolute: 0 10*3/uL (ref 0.0–0.1)
EOS%: 0.8 % (ref 0.0–7.0)
Eosinophils Absolute: 0 10*3/uL (ref 0.0–0.5)
HCT: 26.3 % — ABNORMAL LOW (ref 38.4–49.9)
HGB: 8.9 g/dL — ABNORMAL LOW (ref 13.0–17.1)
LYMPH#: 0.6 10*3/uL — AB (ref 0.9–3.3)
LYMPH%: 59.8 % — AB (ref 14.0–49.0)
MCH: 33.3 pg (ref 27.2–33.4)
MCHC: 33.9 g/dL (ref 32.0–36.0)
MCV: 98.2 fL — ABNORMAL HIGH (ref 79.3–98.0)
MONO#: 0 10*3/uL — AB (ref 0.1–0.9)
MONO%: 2.4 % (ref 0.0–14.0)
NEUT#: 0.4 10*3/uL — CL (ref 1.5–6.5)
NEUT%: 36.6 % — ABNORMAL LOW (ref 39.0–75.0)
Platelets: 72 10*3/uL — ABNORMAL LOW (ref 140–400)
RBC: 2.68 10*6/uL — ABNORMAL LOW (ref 4.20–5.82)
RDW: 24.2 % — ABNORMAL HIGH (ref 11.0–14.6)
WBC: 1 10*3/uL — AB (ref 4.0–10.3)

## 2014-10-01 LAB — MAGNESIUM (CC13): Magnesium: 1.9 mg/dl (ref 1.5–2.5)

## 2014-10-01 LAB — HOLD TUBE, BLOOD BANK

## 2014-10-01 MED ORDER — HEPARIN SOD (PORK) LOCK FLUSH 100 UNIT/ML IV SOLN
500.0000 [IU] | Freq: Once | INTRAVENOUS | Status: AC
  Administered 2014-10-01: 500 [IU] via INTRAVENOUS
  Filled 2014-10-01: qty 5

## 2014-10-01 MED ORDER — SODIUM CHLORIDE 0.9 % IJ SOLN
10.0000 mL | INTRAMUSCULAR | Status: DC | PRN
  Administered 2014-10-01: 10 mL via INTRAVENOUS
  Filled 2014-10-01: qty 10

## 2014-10-01 NOTE — Patient Instructions (Signed)

## 2014-10-02 MED ORDER — SODIUM CHLORIDE 0.9 % IJ SOLN: 3.0000 mL | INTRAMUSCULAR | Status: AC | PRN

## 2014-10-02 MED ORDER — SODIUM CHLORIDE 0.9 % IV SOLN: 250.0000 mL | Freq: Once | INTRAVENOUS | Status: AC

## 2014-10-02 MED ORDER — HEPARIN SOD (PORK) LOCK FLUSH 100 UNIT/ML IV SOLN: 250.0000 [IU] | INTRAVENOUS | Status: AC | PRN

## 2014-10-02 NOTE — Addendum Note (Signed)
Addended by: Ardeen Garland on: 10/02/2014 01:21 PM   Modules accepted: Orders

## 2014-10-06 ENCOUNTER — Other Ambulatory Visit (HOSPITAL_BASED_OUTPATIENT_CLINIC_OR_DEPARTMENT_OTHER): Payer: Medicare Other

## 2014-10-06 ENCOUNTER — Ambulatory Visit (HOSPITAL_BASED_OUTPATIENT_CLINIC_OR_DEPARTMENT_OTHER): Payer: Medicare Other

## 2014-10-06 VITALS — BP 145/69 | HR 59 | Temp 98.3°F | Resp 16

## 2014-10-06 DIAGNOSIS — Z95828 Presence of other vascular implants and grafts: Secondary | ICD-10-CM

## 2014-10-06 DIAGNOSIS — C9200 Acute myeloblastic leukemia, not having achieved remission: Secondary | ICD-10-CM | POA: Diagnosis not present

## 2014-10-06 LAB — CBC WITH DIFFERENTIAL/PLATELET
BASO%: 0.5 % (ref 0.0–2.0)
BASOS ABS: 0 10*3/uL (ref 0.0–0.1)
EOS ABS: 0 10*3/uL (ref 0.0–0.5)
EOS%: 0.9 % (ref 0.0–7.0)
HCT: 27.8 % — ABNORMAL LOW (ref 38.4–49.9)
HEMOGLOBIN: 9.4 g/dL — AB (ref 13.0–17.1)
LYMPH%: 53.9 % — ABNORMAL HIGH (ref 14.0–49.0)
MCH: 34.1 pg — ABNORMAL HIGH (ref 27.2–33.4)
MCHC: 33.8 g/dL (ref 32.0–36.0)
MCV: 100.8 fL — ABNORMAL HIGH (ref 79.3–98.0)
MONO#: 0 10*3/uL — ABNORMAL LOW (ref 0.1–0.9)
MONO%: 1.5 % (ref 0.0–14.0)
NEUT%: 43.2 % (ref 39.0–75.0)
NEUTROS ABS: 0.6 10*3/uL — AB (ref 1.5–6.5)
PLATELETS: 118 10*3/uL — AB (ref 140–400)
RBC: 2.76 10*6/uL — ABNORMAL LOW (ref 4.20–5.82)
RDW: 24.4 % — ABNORMAL HIGH (ref 11.0–14.6)
WBC: 1.4 10*3/uL — ABNORMAL LOW (ref 4.0–10.3)
lymph#: 0.8 10*3/uL — ABNORMAL LOW (ref 0.9–3.3)

## 2014-10-06 LAB — COMPREHENSIVE METABOLIC PANEL (CC13)
ALK PHOS: 65 U/L (ref 40–150)
ALT: <6 U/L (ref 0–55)
ANION GAP: 10 meq/L (ref 3–11)
AST: 10 U/L (ref 5–34)
Albumin: 2.8 g/dL — ABNORMAL LOW (ref 3.5–5.0)
BUN: 10.2 mg/dL (ref 7.0–26.0)
CO2: 29 mEq/L (ref 22–29)
CREATININE: 0.8 mg/dL (ref 0.7–1.3)
Calcium: 8.3 mg/dL — ABNORMAL LOW (ref 8.4–10.4)
Chloride: 103 mEq/L (ref 98–109)
EGFR: 87 mL/min/{1.73_m2} — ABNORMAL LOW (ref >90)
GLUCOSE: 116 mg/dL (ref 70–140)
Potassium: 3.5 mEq/L (ref 3.5–5.1)
SODIUM: 142 meq/L (ref 136–145)
TOTAL PROTEIN: 6.1 g/dL — AB (ref 6.4–8.3)
Total Bilirubin: 0.79 mg/dL (ref 0.20–1.20)

## 2014-10-06 LAB — MAGNESIUM (CC13): MAGNESIUM: 2 mg/dL (ref 1.5–2.5)

## 2014-10-06 LAB — HOLD TUBE, BLOOD BANK

## 2014-10-06 MED ORDER — HEPARIN SOD (PORK) LOCK FLUSH 100 UNIT/ML IV SOLN
500.0000 [IU] | Freq: Once | INTRAVENOUS | Status: AC
  Administered 2014-10-06: 500 [IU] via INTRAVENOUS
  Filled 2014-10-06: qty 5

## 2014-10-06 MED ORDER — SODIUM CHLORIDE 0.9 % IJ SOLN
10.0000 mL | INTRAMUSCULAR | Status: DC | PRN
  Administered 2014-10-06: 10 mL via INTRAVENOUS
  Filled 2014-10-06: qty 10

## 2014-10-06 NOTE — Patient Instructions (Signed)

## 2014-10-07 ENCOUNTER — Telehealth: Payer: Self-pay | Admitting: Cardiology

## 2014-10-07 NOTE — Telephone Encounter (Signed)
Left message to call back  

## 2014-10-07 NOTE — Telephone Encounter (Signed)
[facility] nurse Linus Orn is calling to give the patients weight change and to give a report on patient please give them a call back.

## 2014-10-08 NOTE — Telephone Encounter (Signed)
Tracey from [facility] has not called back.  I did receive a fax reporting pts wt is down 6.6 lbs in the last 30 days.  Based on the information received his wt as of 10/07/14 was 176 lbs.  He denies any s/s of hypovolemia or dehydration but is currently receiving chemotherapy.

## 2014-10-12 ENCOUNTER — Other Ambulatory Visit (HOSPITAL_BASED_OUTPATIENT_CLINIC_OR_DEPARTMENT_OTHER): Payer: Medicare Other

## 2014-10-12 ENCOUNTER — Ambulatory Visit (HOSPITAL_BASED_OUTPATIENT_CLINIC_OR_DEPARTMENT_OTHER): Payer: Medicare Other

## 2014-10-12 VITALS — BP 123/59 | HR 61 | Temp 98.1°F | Resp 16

## 2014-10-12 DIAGNOSIS — C9200 Acute myeloblastic leukemia, not having achieved remission: Secondary | ICD-10-CM

## 2014-10-12 DIAGNOSIS — Z95828 Presence of other vascular implants and grafts: Secondary | ICD-10-CM

## 2014-10-12 LAB — CBC WITH DIFFERENTIAL/PLATELET
BASO%: 0 % (ref 0.0–2.0)
Basophils Absolute: 0 10*3/uL (ref 0.0–0.1)
EOS%: 0.6 % (ref 0.0–7.0)
Eosinophils Absolute: 0 10*3/uL (ref 0.0–0.5)
HEMATOCRIT: 28.7 % — AB (ref 38.4–49.9)
HGB: 9.5 g/dL — ABNORMAL LOW (ref 13.0–17.1)
LYMPH#: 0.8 10*3/uL — AB (ref 0.9–3.3)
LYMPH%: 46.6 % (ref 14.0–49.0)
MCH: 33.7 pg — ABNORMAL HIGH (ref 27.2–33.4)
MCHC: 33.1 g/dL (ref 32.0–36.0)
MCV: 101.8 fL — AB (ref 79.3–98.0)
MONO#: 0 10*3/uL — ABNORMAL LOW (ref 0.1–0.9)
MONO%: 1.2 % (ref 0.0–14.0)
NEUT#: 0.8 10*3/uL — ABNORMAL LOW (ref 1.5–6.5)
NEUT%: 51.6 % (ref 39.0–75.0)
Platelets: 108 10*3/uL — ABNORMAL LOW (ref 140–400)
RBC: 2.82 10*6/uL — ABNORMAL LOW (ref 4.20–5.82)
RDW: 21.1 % — AB (ref 11.0–14.6)
WBC: 1.6 10*3/uL — AB (ref 4.0–10.3)

## 2014-10-12 LAB — COMPREHENSIVE METABOLIC PANEL (CC13)
ALBUMIN: 2.7 g/dL — AB (ref 3.5–5.0)
ALT: 6 U/L (ref 0–55)
AST: 11 U/L (ref 5–34)
Alkaline Phosphatase: 75 U/L (ref 40–150)
Anion Gap: 8 mEq/L (ref 3–11)
BILIRUBIN TOTAL: 0.6 mg/dL (ref 0.20–1.20)
BUN: 12.3 mg/dL (ref 7.0–26.0)
CO2: 28 meq/L (ref 22–29)
Calcium: 8.7 mg/dL (ref 8.4–10.4)
Chloride: 105 mEq/L (ref 98–109)
Creatinine: 0.9 mg/dL (ref 0.7–1.3)
EGFR: 84 mL/min/{1.73_m2} — ABNORMAL LOW (ref >90)
Glucose: 114 mg/dl (ref 70–140)
Potassium: 4 mEq/L (ref 3.5–5.1)
Sodium: 141 mEq/L (ref 136–145)
Total Protein: 6.4 g/dL (ref 6.4–8.3)

## 2014-10-12 LAB — MAGNESIUM (CC13): Magnesium: 2.1 mg/dl (ref 1.5–2.5)

## 2014-10-12 LAB — HOLD TUBE, BLOOD BANK

## 2014-10-12 MED ORDER — HEPARIN SOD (PORK) LOCK FLUSH 100 UNIT/ML IV SOLN
500.0000 [IU] | Freq: Once | INTRAVENOUS | Status: AC
  Administered 2014-10-12: 500 [IU] via INTRAVENOUS
  Filled 2014-10-12: qty 5

## 2014-10-12 MED ORDER — SODIUM CHLORIDE 0.9 % IJ SOLN
10.0000 mL | INTRAMUSCULAR | Status: DC | PRN
  Administered 2014-10-12: 10 mL via INTRAVENOUS
  Filled 2014-10-12: qty 10

## 2014-10-12 NOTE — Patient Instructions (Signed)

## 2014-10-14 ENCOUNTER — Other Ambulatory Visit: Payer: Self-pay | Admitting: *Deleted

## 2014-10-14 DIAGNOSIS — C9200 Acute myeloblastic leukemia, not having achieved remission: Secondary | ICD-10-CM

## 2014-10-19 ENCOUNTER — Ambulatory Visit (HOSPITAL_BASED_OUTPATIENT_CLINIC_OR_DEPARTMENT_OTHER): Payer: Medicare Other

## 2014-10-19 ENCOUNTER — Other Ambulatory Visit (HOSPITAL_BASED_OUTPATIENT_CLINIC_OR_DEPARTMENT_OTHER): Payer: Medicare Other

## 2014-10-19 ENCOUNTER — Encounter: Payer: Self-pay | Admitting: *Deleted

## 2014-10-19 VITALS — BP 124/63 | HR 66 | Temp 98.1°F | Resp 18

## 2014-10-19 DIAGNOSIS — C9200 Acute myeloblastic leukemia, not having achieved remission: Secondary | ICD-10-CM | POA: Diagnosis not present

## 2014-10-19 DIAGNOSIS — Z452 Encounter for adjustment and management of vascular access device: Secondary | ICD-10-CM

## 2014-10-19 DIAGNOSIS — Z95828 Presence of other vascular implants and grafts: Secondary | ICD-10-CM

## 2014-10-19 LAB — COMPREHENSIVE METABOLIC PANEL (CC13)
ALT: 6 U/L (ref 0–55)
ANION GAP: 9 meq/L (ref 3–11)
AST: 11 U/L (ref 5–34)
Albumin: 2.6 g/dL — ABNORMAL LOW (ref 3.5–5.0)
Alkaline Phosphatase: 74 U/L (ref 40–150)
BILIRUBIN TOTAL: 0.83 mg/dL (ref 0.20–1.20)
BUN: 14.9 mg/dL (ref 7.0–26.0)
CO2: 25 meq/L (ref 22–29)
CREATININE: 0.9 mg/dL (ref 0.7–1.3)
Calcium: 8.5 mg/dL (ref 8.4–10.4)
Chloride: 107 mEq/L (ref 98–109)
EGFR: 83 mL/min/{1.73_m2} — AB (ref >90)
GLUCOSE: 104 mg/dL (ref 70–140)
Potassium: 3.6 mEq/L (ref 3.5–5.1)
SODIUM: 141 meq/L (ref 136–145)
TOTAL PROTEIN: 6.2 g/dL — AB (ref 6.4–8.3)

## 2014-10-19 LAB — CBC WITH DIFFERENTIAL/PLATELET
BASO%: 0.3 % (ref 0.0–2.0)
Basophils Absolute: 0 10*3/uL (ref 0.0–0.1)
EOS ABS: 0 10*3/uL (ref 0.0–0.5)
EOS%: 0.5 % (ref 0.0–7.0)
HCT: 30.1 % — ABNORMAL LOW (ref 38.4–49.9)
HGB: 10.3 g/dL — ABNORMAL LOW (ref 13.0–17.1)
LYMPH%: 30.8 % (ref 14.0–49.0)
MCH: 33.6 pg — ABNORMAL HIGH (ref 27.2–33.4)
MCHC: 34.1 g/dL (ref 32.0–36.0)
MCV: 98.4 fL — ABNORMAL HIGH (ref 79.3–98.0)
MONO#: 0 10*3/uL — AB (ref 0.1–0.9)
MONO%: 1 % (ref 0.0–14.0)
NEUT#: 1.7 10*3/uL (ref 1.5–6.5)
NEUT%: 67.4 % (ref 39.0–75.0)
Platelets: 90 10*3/uL — ABNORMAL LOW (ref 140–400)
RBC: 3.06 10*6/uL — AB (ref 4.20–5.82)
RDW: 21.7 % — ABNORMAL HIGH (ref 11.0–14.6)
WBC: 2.5 10*3/uL — AB (ref 4.0–10.3)
lymph#: 0.8 10*3/uL — ABNORMAL LOW (ref 0.9–3.3)

## 2014-10-19 LAB — MAGNESIUM (CC13): Magnesium: 2.1 mg/dl (ref 1.5–2.5)

## 2014-10-19 LAB — HOLD TUBE, BLOOD BANK

## 2014-10-19 MED ORDER — HEPARIN SOD (PORK) LOCK FLUSH 100 UNIT/ML IV SOLN
500.0000 [IU] | Freq: Once | INTRAVENOUS | Status: AC
  Administered 2014-10-19: 500 [IU] via INTRAVENOUS
  Filled 2014-10-19: qty 5

## 2014-10-19 MED ORDER — SODIUM CHLORIDE 0.9 % IJ SOLN
10.0000 mL | INTRAMUSCULAR | Status: DC | PRN
  Administered 2014-10-19: 10 mL via INTRAVENOUS
  Filled 2014-10-19: qty 10

## 2014-10-19 NOTE — Patient Instructions (Signed)

## 2014-10-26 ENCOUNTER — Ambulatory Visit (HOSPITAL_COMMUNITY)
Admission: RE | Admit: 2014-10-26 | Discharge: 2014-10-26 | Disposition: A | Discharge status: Home / Self Care | Payer: Medicare Other | Source: Ambulatory Visit | Attending: Oncology | Admitting: Oncology

## 2014-10-26 ENCOUNTER — Ambulatory Visit (HOSPITAL_BASED_OUTPATIENT_CLINIC_OR_DEPARTMENT_OTHER): Payer: Medicare Other

## 2014-10-26 ENCOUNTER — Other Ambulatory Visit (HOSPITAL_BASED_OUTPATIENT_CLINIC_OR_DEPARTMENT_OTHER): Payer: Medicare Other

## 2014-10-26 ENCOUNTER — Ambulatory Visit: Payer: Medicare Other

## 2014-10-26 ENCOUNTER — Other Ambulatory Visit: Payer: Self-pay | Admitting: *Deleted

## 2014-10-26 VITALS — BP 121/55 | HR 69 | Temp 98.7°F | Resp 18

## 2014-10-26 DIAGNOSIS — D649 Anemia, unspecified: Secondary | ICD-10-CM

## 2014-10-26 DIAGNOSIS — C9210 Chronic myeloid leukemia, BCR/ABL-positive, not having achieved remission: Secondary | ICD-10-CM | POA: Diagnosis present

## 2014-10-26 DIAGNOSIS — C9200 Acute myeloblastic leukemia, not having achieved remission: Secondary | ICD-10-CM | POA: Diagnosis not present

## 2014-10-26 DIAGNOSIS — Z95828 Presence of other vascular implants and grafts: Secondary | ICD-10-CM

## 2014-10-26 LAB — COMPREHENSIVE METABOLIC PANEL (CC13)
ALT: 9 U/L (ref 0–55)
ANION GAP: 7 meq/L (ref 3–11)
AST: 9 U/L (ref 5–34)
Albumin: 2.5 g/dL — ABNORMAL LOW (ref 3.5–5.0)
Alkaline Phosphatase: 80 U/L (ref 40–150)
BILIRUBIN TOTAL: 0.86 mg/dL (ref 0.20–1.20)
BUN: 11.3 mg/dL (ref 7.0–26.0)
CO2: 26 meq/L (ref 22–29)
CREATININE: 0.8 mg/dL (ref 0.7–1.3)
Calcium: 8.3 mg/dL — ABNORMAL LOW (ref 8.4–10.4)
Chloride: 107 mEq/L (ref 98–109)
EGFR: 87 mL/min/{1.73_m2} — AB (ref >90)
GLUCOSE: 110 mg/dL (ref 70–140)
Potassium: 3.7 mEq/L (ref 3.5–5.1)
Sodium: 140 mEq/L (ref 136–145)
Total Protein: 6 g/dL — ABNORMAL LOW (ref 6.4–8.3)

## 2014-10-26 LAB — CBC WITH DIFFERENTIAL/PLATELET
BASO%: 0 % (ref 0.0–2.0)
Basophils Absolute: 0 10*3/uL (ref 0.0–0.1)
EOS%: 2 % (ref 0.0–7.0)
Eosinophils Absolute: 0 10*3/uL (ref 0.0–0.5)
HCT: 25.4 % — ABNORMAL LOW (ref 38.4–49.9)
HGB: 8.6 g/dL — ABNORMAL LOW (ref 13.0–17.1)
LYMPH%: 60.2 % — ABNORMAL HIGH (ref 14.0–49.0)
MCH: 34 pg — ABNORMAL HIGH (ref 27.2–33.4)
MCHC: 33.9 g/dL (ref 32.0–36.0)
MCV: 100.4 fL — ABNORMAL HIGH (ref 79.3–98.0)
MONO#: 0 10*3/uL — AB (ref 0.1–0.9)
MONO%: 2 % (ref 0.0–14.0)
NEUT%: 35.8 % — ABNORMAL LOW (ref 39.0–75.0)
NEUTROS ABS: 0.4 10*3/uL — AB (ref 1.5–6.5)
PLATELETS: 15 10*3/uL — AB (ref 140–400)
RBC: 2.53 10*6/uL — AB (ref 4.20–5.82)
RDW: 18.8 % — ABNORMAL HIGH (ref 11.0–14.6)
WBC: 1 10*3/uL — AB (ref 4.0–10.3)
lymph#: 0.6 10*3/uL — ABNORMAL LOW (ref 0.9–3.3)

## 2014-10-26 LAB — HOLD TUBE, BLOOD BANK

## 2014-10-26 LAB — MAGNESIUM (CC13): Magnesium: 1.8 mg/dl (ref 1.5–2.5)

## 2014-10-26 MED ORDER — SODIUM CHLORIDE 0.9 % IJ SOLN
10.0000 mL | INTRAMUSCULAR | Status: DC | PRN
  Administered 2014-10-26 (×2): 10 mL via INTRAVENOUS
  Filled 2014-10-26: qty 10

## 2014-10-26 MED ORDER — ACETAMINOPHEN 325 MG PO TABS
ORAL_TABLET | ORAL | Status: AC
  Filled 2014-10-26: qty 2

## 2014-10-26 MED ORDER — ACETAMINOPHEN 325 MG PO TABS
650.0000 mg | ORAL_TABLET | Freq: Once | ORAL | Status: AC
  Administered 2014-10-26: 650 mg via ORAL

## 2014-10-26 MED ORDER — HEPARIN SOD (PORK) LOCK FLUSH 100 UNIT/ML IV SOLN
500.0000 [IU] | Freq: Once | INTRAVENOUS | Status: AC
  Administered 2014-10-26: 500 [IU] via INTRAVENOUS
  Filled 2014-10-26: qty 5

## 2014-10-26 MED ORDER — DIPHENHYDRAMINE HCL 25 MG PO CAPS
25.0000 mg | ORAL_CAPSULE | Freq: Once | ORAL | Status: AC
  Administered 2014-10-26: 25 mg via ORAL

## 2014-10-26 MED ORDER — SODIUM CHLORIDE 0.9 % IJ SOLN
10.0000 mL | INTRAMUSCULAR | Status: AC | PRN
  Administered 2014-10-26: 10 mL
  Filled 2014-10-26: qty 10

## 2014-10-26 MED ORDER — SODIUM CHLORIDE 0.9 % IV SOLN
250.0000 mL | Freq: Once | INTRAVENOUS | Status: AC
  Administered 2014-10-26: 250 mL via INTRAVENOUS

## 2014-10-26 MED ORDER — FUROSEMIDE 10 MG/ML IJ SOLN: 20.0000 mg | Freq: Once | INTRAMUSCULAR | Status: DC

## 2014-10-26 MED ORDER — FUROSEMIDE 10 MG/ML IJ SOLN
INTRAMUSCULAR | Status: AC
  Filled 2014-10-26: qty 2

## 2014-10-26 MED ORDER — DIPHENHYDRAMINE HCL 25 MG PO CAPS
ORAL_CAPSULE | ORAL | Status: AC
  Filled 2014-10-26: qty 1

## 2014-10-26 NOTE — Patient Instructions (Signed)

## 2014-10-26 NOTE — Patient Instructions (Signed)

## 2014-10-27 ENCOUNTER — Ambulatory Visit (HOSPITAL_BASED_OUTPATIENT_CLINIC_OR_DEPARTMENT_OTHER): Payer: Medicare Other

## 2014-10-27 VITALS — BP 105/49 | HR 62 | Temp 97.7°F | Resp 17

## 2014-10-27 DIAGNOSIS — C9200 Acute myeloblastic leukemia, not having achieved remission: Secondary | ICD-10-CM | POA: Diagnosis not present

## 2014-10-27 DIAGNOSIS — D649 Anemia, unspecified: Secondary | ICD-10-CM

## 2014-10-27 LAB — PREPARE PLATELET PHERESIS: UNIT DIVISION: 0

## 2014-10-27 MED ORDER — HEPARIN SOD (PORK) LOCK FLUSH 100 UNIT/ML IV SOLN
500.0000 [IU] | Freq: Every day | INTRAVENOUS | Status: AC | PRN
  Administered 2014-10-27: 500 [IU]
  Filled 2014-10-27: qty 5

## 2014-10-27 MED ORDER — SODIUM CHLORIDE 0.9 % IJ SOLN
10.0000 mL | INTRAMUSCULAR | Status: AC | PRN
  Administered 2014-10-27: 10 mL
  Filled 2014-10-27: qty 10

## 2014-10-27 MED ORDER — ACETAMINOPHEN 325 MG PO TABS
ORAL_TABLET | ORAL | Status: AC
  Filled 2014-10-27: qty 2

## 2014-10-27 MED ORDER — DIPHENHYDRAMINE HCL 25 MG PO CAPS
25.0000 mg | ORAL_CAPSULE | Freq: Once | ORAL | Status: AC
  Administered 2014-10-27: 25 mg via ORAL

## 2014-10-27 MED ORDER — DIPHENHYDRAMINE HCL 25 MG PO CAPS
ORAL_CAPSULE | ORAL | Status: AC
Start: 2014-10-27 — End: 2014-10-27
  Filled 2014-10-27: qty 1

## 2014-10-27 MED ORDER — ACETAMINOPHEN 325 MG PO TABS
650.0000 mg | ORAL_TABLET | Freq: Once | ORAL | Status: AC
  Administered 2014-10-27: 650 mg via ORAL

## 2014-10-27 MED ORDER — SODIUM CHLORIDE 0.9 % IV SOLN
250.0000 mL | Freq: Once | INTRAVENOUS | Status: AC
  Administered 2014-10-27: 250 mL via INTRAVENOUS

## 2014-10-27 NOTE — Patient Instructions (Signed)

## 2014-10-28 ENCOUNTER — Other Ambulatory Visit: Payer: Self-pay | Admitting: *Deleted

## 2014-10-28 ENCOUNTER — Telehealth: Payer: Self-pay | Admitting: Oncology

## 2014-10-28 DIAGNOSIS — D72819 Decreased white blood cell count, unspecified: Secondary | ICD-10-CM

## 2014-10-28 DIAGNOSIS — C9200 Acute myeloblastic leukemia, not having achieved remission: Secondary | ICD-10-CM

## 2014-10-28 DIAGNOSIS — D509 Iron deficiency anemia, unspecified: Secondary | ICD-10-CM

## 2014-10-28 LAB — TYPE AND SCREEN
ABO/RH(D): O POS
Antibody Screen: NEGATIVE
UNIT DIVISION: 0
Unit division: 0

## 2014-10-28 NOTE — Telephone Encounter (Signed)
s.w. pt and advised on on June appt...Marland KitchenMarland Kitchen

## 2014-10-29 ENCOUNTER — Other Ambulatory Visit (HOSPITAL_BASED_OUTPATIENT_CLINIC_OR_DEPARTMENT_OTHER): Payer: Medicare Other

## 2014-10-29 ENCOUNTER — Encounter: Payer: Self-pay | Admitting: Cardiology

## 2014-10-29 ENCOUNTER — Ambulatory Visit (HOSPITAL_BASED_OUTPATIENT_CLINIC_OR_DEPARTMENT_OTHER): Payer: Medicare Other

## 2014-10-29 VITALS — BP 135/63 | HR 63 | Temp 98.5°F | Resp 16

## 2014-10-29 DIAGNOSIS — D509 Iron deficiency anemia, unspecified: Secondary | ICD-10-CM

## 2014-10-29 DIAGNOSIS — D72819 Decreased white blood cell count, unspecified: Secondary | ICD-10-CM

## 2014-10-29 DIAGNOSIS — C9200 Acute myeloblastic leukemia, not having achieved remission: Secondary | ICD-10-CM | POA: Diagnosis not present

## 2014-10-29 DIAGNOSIS — Z95828 Presence of other vascular implants and grafts: Secondary | ICD-10-CM

## 2014-10-29 LAB — CBC WITH DIFFERENTIAL/PLATELET
BASO%: 0 % (ref 0.0–2.0)
BASOS ABS: 0 10*3/uL (ref 0.0–0.1)
EOS ABS: 0 10*3/uL (ref 0.0–0.5)
EOS%: 2.1 % (ref 0.0–7.0)
HCT: 26.3 % — ABNORMAL LOW (ref 38.4–49.9)
HGB: 8.9 g/dL — ABNORMAL LOW (ref 13.0–17.1)
LYMPH%: 75 % — ABNORMAL HIGH (ref 14.0–49.0)
MCH: 33.3 pg (ref 27.2–33.4)
MCHC: 33.8 g/dL (ref 32.0–36.0)
MCV: 98.5 fL — ABNORMAL HIGH (ref 79.3–98.0)
MONO#: 0 10*3/uL — ABNORMAL LOW (ref 0.1–0.9)
MONO%: 4.2 % (ref 0.0–14.0)
NEUT%: 18.7 % — ABNORMAL LOW (ref 39.0–75.0)
NEUTROS ABS: 0.1 10*3/uL — AB (ref 1.5–6.5)
PLATELETS: 22 10*3/uL — AB (ref 140–400)
RBC: 2.67 10*6/uL — ABNORMAL LOW (ref 4.20–5.82)
RDW: 19.8 % — ABNORMAL HIGH (ref 11.0–14.6)
WBC: 0.5 10*3/uL — CL (ref 4.0–10.3)
lymph#: 0.4 10*3/uL — ABNORMAL LOW (ref 0.9–3.3)

## 2014-10-29 LAB — BASIC METABOLIC PANEL (CC13)
ANION GAP: 7 meq/L (ref 3–11)
BUN: 14.5 mg/dL (ref 7.0–26.0)
CALCIUM: 8.6 mg/dL (ref 8.4–10.4)
CO2: 27 mEq/L (ref 22–29)
Chloride: 107 mEq/L (ref 98–109)
Creatinine: 0.9 mg/dL (ref 0.7–1.3)
EGFR: 84 mL/min/{1.73_m2} — ABNORMAL LOW (ref >90)
GLUCOSE: 118 mg/dL (ref 70–140)
POTASSIUM: 3.3 meq/L — AB (ref 3.5–5.1)
Sodium: 141 mEq/L (ref 136–145)

## 2014-10-29 LAB — HOLD TUBE, BLOOD BANK

## 2014-10-29 MED ORDER — SODIUM CHLORIDE 0.9 % IJ SOLN
10.0000 mL | INTRAMUSCULAR | Status: DC | PRN
  Administered 2014-10-29: 10 mL via INTRAVENOUS
  Filled 2014-10-29: qty 10

## 2014-10-29 MED ORDER — HEPARIN SOD (PORK) LOCK FLUSH 100 UNIT/ML IV SOLN
500.0000 [IU] | Freq: Once | INTRAVENOUS | Status: AC
  Administered 2014-10-29: 500 [IU] via INTRAVENOUS
  Filled 2014-10-29: qty 5

## 2014-10-29 NOTE — Patient Instructions (Signed)

## 2014-11-02 ENCOUNTER — Other Ambulatory Visit: Payer: Non-veteran care

## 2014-11-05 ENCOUNTER — Ambulatory Visit (HOSPITAL_BASED_OUTPATIENT_CLINIC_OR_DEPARTMENT_OTHER): Payer: Medicare Other

## 2014-11-05 ENCOUNTER — Other Ambulatory Visit (HOSPITAL_BASED_OUTPATIENT_CLINIC_OR_DEPARTMENT_OTHER): Payer: Medicare Other

## 2014-11-05 VITALS — BP 96/49 | HR 64 | Temp 98.5°F | Resp 16

## 2014-11-05 DIAGNOSIS — C9200 Acute myeloblastic leukemia, not having achieved remission: Secondary | ICD-10-CM

## 2014-11-05 DIAGNOSIS — Z95828 Presence of other vascular implants and grafts: Secondary | ICD-10-CM

## 2014-11-05 LAB — COMPREHENSIVE METABOLIC PANEL (CC13)
ALT: 10 U/L (ref 0–55)
AST: 18 U/L (ref 5–34)
Albumin: 2.3 g/dL — ABNORMAL LOW (ref 3.5–5.0)
Alkaline Phosphatase: 105 U/L (ref 40–150)
Anion Gap: 11 mEq/L (ref 3–11)
BUN: 17.7 mg/dL (ref 7.0–26.0)
CALCIUM: 8.8 mg/dL (ref 8.4–10.4)
CHLORIDE: 101 meq/L (ref 98–109)
CO2: 28 meq/L (ref 22–29)
Creatinine: 1.4 mg/dL — ABNORMAL HIGH (ref 0.7–1.3)
EGFR: 51 mL/min/{1.73_m2} — ABNORMAL LOW (ref >90)
Glucose: 97 mg/dl (ref 70–140)
POTASSIUM: 3.6 meq/L (ref 3.5–5.1)
Sodium: 140 mEq/L (ref 136–145)
Total Bilirubin: 0.87 mg/dL (ref 0.20–1.20)
Total Protein: 5.8 g/dL — ABNORMAL LOW (ref 6.4–8.3)

## 2014-11-05 LAB — CBC WITH DIFFERENTIAL/PLATELET
BASO%: 0 % (ref 0.0–2.0)
Basophils Absolute: 0 10*3/uL (ref 0.0–0.1)
EOS ABS: 0 10*3/uL (ref 0.0–0.5)
EOS%: 0.4 % (ref 0.0–7.0)
HCT: 28.3 % — ABNORMAL LOW (ref 38.4–49.9)
HGB: 9.4 g/dL — ABNORMAL LOW (ref 13.0–17.1)
LYMPH#: 1.5 10*3/uL (ref 0.9–3.3)
LYMPH%: 62.3 % — AB (ref 14.0–49.0)
MCH: 32.6 pg (ref 27.2–33.4)
MCHC: 33.2 g/dL (ref 32.0–36.0)
MCV: 98.3 fL — ABNORMAL HIGH (ref 79.3–98.0)
MONO#: 0.7 10*3/uL (ref 0.1–0.9)
MONO%: 28.7 % — ABNORMAL HIGH (ref 0.0–14.0)
NEUT%: 8.6 % — AB (ref 39.0–75.0)
NEUTROS ABS: 0.2 10*3/uL — AB (ref 1.5–6.5)
PLATELETS: 45 10*3/uL — AB (ref 140–400)
RBC: 2.88 10*6/uL — ABNORMAL LOW (ref 4.20–5.82)
RDW: 19.9 % — ABNORMAL HIGH (ref 11.0–14.6)
WBC: 2.5 10*3/uL — AB (ref 4.0–10.3)

## 2014-11-05 LAB — MAGNESIUM (CC13): Magnesium: 1.7 mg/dl (ref 1.5–2.5)

## 2014-11-05 MED ORDER — SODIUM CHLORIDE 0.9 % IJ SOLN
10.0000 mL | INTRAMUSCULAR | Status: DC | PRN
  Administered 2014-11-05: 10 mL via INTRAVENOUS
  Filled 2014-11-05: qty 10

## 2014-11-05 MED ORDER — HEPARIN SOD (PORK) LOCK FLUSH 100 UNIT/ML IV SOLN
500.0000 [IU] | Freq: Once | INTRAVENOUS | Status: AC
  Administered 2014-11-05: 500 [IU] via INTRAVENOUS
  Filled 2014-11-05: qty 5

## 2014-11-05 NOTE — Patient Instructions (Signed)

## 2014-11-06 LAB — TECHNOLOGIST REVIEW: Technologist Review: 22

## 2014-11-12 ENCOUNTER — Telehealth: Payer: Self-pay | Admitting: Oncology

## 2014-11-12 ENCOUNTER — Other Ambulatory Visit: Payer: Self-pay

## 2014-11-12 NOTE — Telephone Encounter (Signed)
Pt called cancelled all labs/flush per hospital where pt is having chemo done, he will have them call us or he will call to r/s [facility]

## 2014-11-19 ENCOUNTER — Other Ambulatory Visit: Payer: Self-pay

## 2014-11-26 ENCOUNTER — Other Ambulatory Visit: Payer: Self-pay | Admitting: *Deleted

## 2014-11-26 ENCOUNTER — Other Ambulatory Visit: Payer: Self-pay

## 2014-11-26 DIAGNOSIS — C9200 Acute myeloblastic leukemia, not having achieved remission: Secondary | ICD-10-CM

## 2014-11-27 ENCOUNTER — Telehealth: Payer: Self-pay | Admitting: Oncology

## 2014-11-27 NOTE — Telephone Encounter (Signed)
Spoke with patient re mon/thrus labs 7/25 thru 8/8. Patient given next two appointment date/times and will get schedule 7/25.

## 2014-11-30 ENCOUNTER — Ambulatory Visit: Payer: Medicare Other

## 2014-11-30 ENCOUNTER — Other Ambulatory Visit (HOSPITAL_BASED_OUTPATIENT_CLINIC_OR_DEPARTMENT_OTHER): Payer: Medicare Other

## 2014-11-30 ENCOUNTER — Telehealth: Payer: Self-pay

## 2014-11-30 ENCOUNTER — Other Ambulatory Visit: Payer: Self-pay | Admitting: Oncology

## 2014-11-30 ENCOUNTER — Ambulatory Visit (HOSPITAL_BASED_OUTPATIENT_CLINIC_OR_DEPARTMENT_OTHER): Payer: Medicare Other

## 2014-11-30 ENCOUNTER — Ambulatory Visit (HOSPITAL_COMMUNITY)
Admission: RE | Admit: 2014-11-30 | Discharge: 2014-11-30 | Disposition: A | Discharge status: Home / Self Care | Payer: Medicare Other | Source: Ambulatory Visit | Attending: Oncology | Admitting: Oncology

## 2014-11-30 VITALS — BP 138/78 | HR 72 | Temp 98.1°F | Resp 20

## 2014-11-30 VITALS — BP 133/57 | HR 62 | Temp 98.5°F | Resp 16

## 2014-11-30 DIAGNOSIS — C9200 Acute myeloblastic leukemia, not having achieved remission: Secondary | ICD-10-CM | POA: Insufficient documentation

## 2014-11-30 DIAGNOSIS — Z95828 Presence of other vascular implants and grafts: Secondary | ICD-10-CM

## 2014-11-30 LAB — COMPREHENSIVE METABOLIC PANEL (CC13)
ALT: 9 U/L (ref 0–55)
AST: 17 U/L (ref 5–34)
Albumin: 2.5 g/dL — ABNORMAL LOW (ref 3.5–5.0)
Alkaline Phosphatase: 81 U/L (ref 40–150)
Anion Gap: 10 mEq/L (ref 3–11)
BUN: 16.4 mg/dL (ref 7.0–26.0)
CALCIUM: 8.5 mg/dL (ref 8.4–10.4)
CO2: 25 mEq/L (ref 22–29)
Chloride: 106 mEq/L (ref 98–109)
Creatinine: 1.1 mg/dL (ref 0.7–1.3)
EGFR: 68 mL/min/{1.73_m2} — AB (ref >90)
Glucose: 122 mg/dl (ref 70–140)
POTASSIUM: 3.6 meq/L (ref 3.5–5.1)
SODIUM: 141 meq/L (ref 136–145)
TOTAL PROTEIN: 5.4 g/dL — AB (ref 6.4–8.3)
Total Bilirubin: 0.73 mg/dL (ref 0.20–1.20)

## 2014-11-30 LAB — CBC WITH DIFFERENTIAL/PLATELET
BASO%: 0 % (ref 0.0–2.0)
BASOS ABS: 0 10*3/uL (ref 0.0–0.1)
EOS%: 1.5 % (ref 0.0–7.0)
Eosinophils Absolute: 0 10*3/uL (ref 0.0–0.5)
HCT: 26.7 % — ABNORMAL LOW (ref 38.4–49.9)
HEMOGLOBIN: 9 g/dL — AB (ref 13.0–17.1)
LYMPH#: 0.5 10*3/uL — AB (ref 0.9–3.3)
LYMPH%: 77.3 % — AB (ref 14.0–49.0)
MCH: 31.5 pg (ref 27.2–33.4)
MCHC: 33.7 g/dL (ref 32.0–36.0)
MCV: 93.4 fL (ref 79.3–98.0)
MONO#: 0 10*3/uL — AB (ref 0.1–0.9)
MONO%: 4.5 % (ref 0.0–14.0)
NEUT#: 0.1 10*3/uL — CL (ref 1.5–6.5)
NEUT%: 16.7 % — ABNORMAL LOW (ref 39.0–75.0)
Platelets: 4 10*3/uL — CL (ref 140–400)
RBC: 2.86 10*6/uL — AB (ref 4.20–5.82)
RDW: 17.1 % — AB (ref 11.0–14.6)
WBC: 0.7 10*3/uL — CL (ref 4.0–10.3)
nRBC: 0 % (ref 0–0)

## 2014-11-30 LAB — TECHNOLOGIST REVIEW

## 2014-11-30 MED ORDER — SODIUM CHLORIDE 0.9 % IJ SOLN
10.0000 mL | INTRAMUSCULAR | Status: DC | PRN
  Administered 2014-11-30: 10 mL via INTRAVENOUS
  Filled 2014-11-30: qty 10

## 2014-11-30 MED ORDER — HEPARIN SOD (PORK) LOCK FLUSH 100 UNIT/ML IV SOLN
500.0000 [IU] | Freq: Every day | INTRAVENOUS | Status: AC | PRN
  Administered 2014-11-30: 500 [IU]
  Filled 2014-11-30: qty 5

## 2014-11-30 MED ORDER — DIPHENHYDRAMINE HCL 25 MG PO CAPS
25.0000 mg | ORAL_CAPSULE | Freq: Once | ORAL | Status: AC
Start: 2014-11-30 — End: 2014-11-30
  Administered 2014-11-30: 25 mg via ORAL

## 2014-11-30 MED ORDER — SODIUM CHLORIDE 0.9 % IJ SOLN
10.0000 mL | INTRAMUSCULAR | Status: AC | PRN
  Administered 2014-11-30: 10 mL
  Filled 2014-11-30: qty 10

## 2014-11-30 MED ORDER — HEPARIN SOD (PORK) LOCK FLUSH 100 UNIT/ML IV SOLN
500.0000 [IU] | Freq: Once | INTRAVENOUS | Status: AC
  Administered 2014-11-30: 500 [IU] via INTRAVENOUS
  Filled 2014-11-30: qty 5

## 2014-11-30 MED ORDER — SODIUM CHLORIDE 0.9 % IV SOLN
250.0000 mL | Freq: Once | INTRAVENOUS | Status: AC
  Administered 2014-11-30: 250 mL via INTRAVENOUS

## 2014-11-30 MED ORDER — ACETAMINOPHEN 325 MG PO TABS
650.0000 mg | ORAL_TABLET | Freq: Once | ORAL | Status: AC
  Administered 2014-11-30: 650 mg via ORAL

## 2014-11-30 MED ORDER — SODIUM CHLORIDE 0.9 % IJ SOLN
10.0000 mL | INTRAMUSCULAR | Status: AC | PRN
  Administered 2014-11-30: 10 mL via INTRAVENOUS
  Filled 2014-11-30: qty 10

## 2014-11-30 MED ORDER — ACETAMINOPHEN 325 MG PO TABS
ORAL_TABLET | ORAL | Status: AC
  Filled 2014-11-30: qty 2

## 2014-11-30 MED ORDER — DIPHENHYDRAMINE HCL 25 MG PO CAPS
ORAL_CAPSULE | ORAL | Status: AC
  Filled 2014-11-30: qty 1

## 2014-11-30 NOTE — Telephone Encounter (Signed)
S/w RN from [facility] and explained that pt was ordered 1 unit of platelets for today. Pt's wife is aware and will call [name] on his cell to let him know appt is at 1230.

## 2014-11-30 NOTE — Patient Instructions (Signed)

## 2014-11-30 NOTE — Patient Instructions (Signed)
Platelet Transfusion Information °This is information about transfusions of platelets. Platelets are tiny cells made by the bone marrow and found in the blood. When a blood vessel is damaged, platelets rush to the damaged area to help form a clot. This begins the healing process. When platelets get very low, your blood may have trouble clotting. This may be from: °· Illness. °· Blood disorder. °· Chemotherapy to treat cancer. °Often, lower platelet counts do not cause problems.  °Platelets usually last for 7 to 10 days. If they are not used in an injury, they are broken down by the liver or spleen. °Symptoms of low platelet count include: °· Nosebleeds. °· Bleeding gums. °· Heavy periods. °· Bruising and tiny blood spots in the skin. °¨ Pinpoint spots of bleeding (petechiae). °¨ Larger bruises (purpura). °· Bleeding can be more serious if it happens in the brain or bowel. °Platelet transfusions are often used to keep the platelet count at an acceptable level. Serious bleeding due to low platelets is uncommon. °RISKS AND COMPLICATIONS °Severe side effects from platelet transfusions are uncommon. Minor reactions may include: °· Itching. °· Rashes. °· High temperature and shivering. °Medications are available to stop transfusion reactions. Let your health care provider know if you develop any of the above problems.  °If you are having platelet transfusions frequently, they may get less effective. This is called becoming refractory to platelets. It is uncommon. This can happen from non-immune causes and immune causes. Non-immune causes include: °· High temperatures. °· Some medications. °· An enlarged spleen. °Immune causes happen when your body discovers the platelets are not your own and begins making antibodies against them. The antibodies kill the platelets quickly. Even with platelet transfusions, you may still notice problems with bleeding or bruising. Let your health care providers know about this. Other things  can be done to help if this happens.  °BEFORE THE PROCEDURE  °· Your health care provider will check your platelet count regularly. °· If the platelet count is too low, it may be necessary to have a platelet transfusion. °· This is more important before certain procedures with a risk of bleeding, such as a spinal tap. °· Platelet transfusion reduces the risk of bleeding during or after the procedure. °· Except in emergencies, giving a transfusion requires a written consent. °Before blood is taken from a donor, a complete history is taken to make sure the person has no history of previous diseases, nor engages in risky social behavior. Examples of this are intravenous drug use or sexual activity with multiple partners. This could lead to infected blood or blood products being used. This history is taken in spite of the extensive testing to make sure the blood is safe. All blood products transfused are tested to make sure it is a match for the person getting the blood. It is also checked for infections. Blood is the safest it has ever been. The risk of getting an infection is very low. °PROCEDURE °· The platelets are stored in small plastic bags that are kept at a low temperature. °· Each bag is called a unit and sometimes two units are given. They are given through an intravenous line by drip infusion over about one-half hour. °· Usually blood is collected from multiple people to get enough to transfuse. °· Sometimes, the platelets are collected from a single person. This is done using a special machine that separates the platelets from the blood. The machine is called an apheresis machine. Platelets collected in this   way are called apheresed platelets. Apheresed platelets reduce the risk of becoming sensitive to the platelets. This lowers the chances of having a transfusion reaction. °· As it only takes a short time to give the platelets, this treatment can be given in an outpatient department. Platelets can also be  given before or after other treatments. °SEEK IMMEDIATE MEDICAL CARE IF: °You have any of the following symptoms over the next 12 hours or several days: °· Shaking chills. °· Fever with a temperature greater than 102°F (38.9°C) develops. °· Back pain or muscle pain. °· People around you feel you are not acting correctly, or you are confused. °· Blood in the urine or bowel movements, or bleeding from any place in your body. °· Shortness of breath, or difficulty breathing. °· Dizziness. °· Fainting. °· You break out in a rash or develop hives. °· Decrease in the amount of urine you are putting out, or the urine turns a dark color or changes to pink, red, or brown. °· A severe headache or stiff neck. °· Bruising more easily. °Document Released: 02/19/2007 Document Revised: 09/08/2013 Document Reviewed: 02/19/2007 °ExitCare® Patient Information ©2015 [employer]. This information is not intended to replace advice given to you by your health care provider. Make sure you discuss any questions you have with your health care provider. ° °

## 2014-11-30 NOTE — Addendum Note (Signed)
Addended by: Jethro Bolus A on: 11/30/2014 12:52 PM   Modules accepted: Orders, SmartSet

## 2014-11-30 NOTE — Telephone Encounter (Signed)
Talked with RN at [facility] and then faxed labs over.

## 2014-12-01 LAB — PREPARE PLATELET PHERESIS: UNIT DIVISION: 0

## 2014-12-03 ENCOUNTER — Telehealth: Payer: Self-pay | Admitting: Oncology

## 2014-12-03 ENCOUNTER — Other Ambulatory Visit (HOSPITAL_BASED_OUTPATIENT_CLINIC_OR_DEPARTMENT_OTHER): Payer: Medicare Other

## 2014-12-03 ENCOUNTER — Ambulatory Visit (HOSPITAL_BASED_OUTPATIENT_CLINIC_OR_DEPARTMENT_OTHER): Payer: Medicare Other

## 2014-12-03 ENCOUNTER — Other Ambulatory Visit: Payer: Self-pay

## 2014-12-03 ENCOUNTER — Other Ambulatory Visit: Payer: Self-pay | Admitting: *Deleted

## 2014-12-03 DIAGNOSIS — D649 Anemia, unspecified: Secondary | ICD-10-CM

## 2014-12-03 DIAGNOSIS — C9200 Acute myeloblastic leukemia, not having achieved remission: Secondary | ICD-10-CM

## 2014-12-03 DIAGNOSIS — Z95828 Presence of other vascular implants and grafts: Secondary | ICD-10-CM

## 2014-12-03 LAB — CBC WITH DIFFERENTIAL/PLATELET
BASO%: 0 % (ref 0.0–2.0)
Basophils Absolute: 0 10*3/uL (ref 0.0–0.1)
EOS%: 1.4 % (ref 0.0–7.0)
Eosinophils Absolute: 0 10*3/uL (ref 0.0–0.5)
HEMATOCRIT: 24.6 % — AB (ref 38.4–49.9)
HEMOGLOBIN: 8.3 g/dL — AB (ref 13.0–17.1)
LYMPH#: 0.6 10*3/uL — AB (ref 0.9–3.3)
LYMPH%: 80.8 % — ABNORMAL HIGH (ref 14.0–49.0)
MCH: 31.3 pg (ref 27.2–33.4)
MCHC: 33.7 g/dL (ref 32.0–36.0)
MCV: 92.8 fL (ref 79.3–98.0)
MONO#: 0.1 10*3/uL (ref 0.1–0.9)
MONO%: 8.2 % (ref 0.0–14.0)
NEUT%: 9.6 % — ABNORMAL LOW (ref 39.0–75.0)
NEUTROS ABS: 0.1 10*3/uL — AB (ref 1.5–6.5)
Platelets: 4 10*3/uL — CL (ref 140–400)
RBC: 2.65 10*6/uL — ABNORMAL LOW (ref 4.20–5.82)
RDW: 16.8 % — ABNORMAL HIGH (ref 11.0–14.6)
WBC: 0.7 10*3/uL — AB (ref 4.0–10.3)
nRBC: 0 % (ref 0–0)

## 2014-12-03 LAB — COMPREHENSIVE METABOLIC PANEL (CC13)
ALBUMIN: 2.6 g/dL — AB (ref 3.5–5.0)
ALK PHOS: 86 U/L (ref 40–150)
ALT: 10 U/L (ref 0–55)
ANION GAP: 8 meq/L (ref 3–11)
AST: 18 U/L (ref 5–34)
BUN: 27.7 mg/dL — AB (ref 7.0–26.0)
CALCIUM: 8.6 mg/dL (ref 8.4–10.4)
CO2: 26 meq/L (ref 22–29)
Chloride: 103 mEq/L (ref 98–109)
Creatinine: 1.4 mg/dL — ABNORMAL HIGH (ref 0.7–1.3)
EGFR: 47 mL/min/{1.73_m2} — ABNORMAL LOW (ref >90)
Glucose: 91 mg/dl (ref 70–140)
Potassium: 4 mEq/L (ref 3.5–5.1)
Sodium: 137 mEq/L (ref 136–145)
Total Bilirubin: 0.84 mg/dL (ref 0.20–1.20)
Total Protein: 5.9 g/dL — ABNORMAL LOW (ref 6.4–8.3)

## 2014-12-03 LAB — MAGNESIUM (CC13): Magnesium: 1.9 mg/dl (ref 1.5–2.5)

## 2014-12-03 MED ORDER — SODIUM CHLORIDE 0.9 % IJ SOLN
10.0000 mL | INTRAMUSCULAR | Status: DC | PRN
  Administered 2014-12-03: 10 mL via INTRAVENOUS
  Filled 2014-12-03: qty 10

## 2014-12-03 MED ORDER — HEPARIN SOD (PORK) LOCK FLUSH 100 UNIT/ML IV SOLN
500.0000 [IU] | Freq: Once | INTRAVENOUS | Status: AC
  Administered 2014-12-03: 500 [IU] via INTRAVENOUS
  Filled 2014-12-03: qty 5

## 2014-12-03 NOTE — Telephone Encounter (Signed)
Pt advised to add flushes to all labs...done..he requested to cx 8.8 labs due to going to get chemo on 8.9

## 2014-12-03 NOTE — Progress Notes (Signed)
Labs came back today with a platelet count of 4 and Hemoglobin of 8.3. Patient left [facility] and went to his appointment at the [facility]. I called and spoke with the wife regarding lab results.[place] Patient stated, "I don't have any chest pain or shortness of breath. I don't need blood." I instructed patient that he needed to get a unit of platelets for a platelet count of 4.  Wife stated that's fine. [facility] is full today and tomorrow. Instructed wife that he would have to get platelets at [facility] tomorrow at 09:00 am. [facility]. Wife verbalized understanding. Orders placed for platelets under signed and held.

## 2014-12-03 NOTE — Patient Instructions (Signed)

## 2014-12-04 ENCOUNTER — Ambulatory Visit (HOSPITAL_COMMUNITY)
Admission: RE | Admit: 2014-12-04 | Discharge: 2014-12-04 | Disposition: A | Discharge status: Home / Self Care | Payer: Medicare Other | Source: Ambulatory Visit | Attending: Oncology | Admitting: Oncology

## 2014-12-04 ENCOUNTER — Other Ambulatory Visit: Payer: Self-pay | Admitting: *Deleted

## 2014-12-04 ENCOUNTER — Encounter: Payer: Self-pay | Admitting: *Deleted

## 2014-12-04 ENCOUNTER — Other Ambulatory Visit: Payer: Self-pay | Admitting: Medical Oncology

## 2014-12-04 VITALS — BP 131/58 | HR 64 | Temp 98.0°F | Resp 18

## 2014-12-04 DIAGNOSIS — D696 Thrombocytopenia, unspecified: Secondary | ICD-10-CM | POA: Diagnosis present

## 2014-12-04 DIAGNOSIS — D649 Anemia, unspecified: Secondary | ICD-10-CM | POA: Diagnosis not present

## 2014-12-04 LAB — PREPARE RBC (CROSSMATCH)

## 2014-12-04 MED ORDER — ACETAMINOPHEN 325 MG PO TABS: 650.0000 mg | ORAL_TABLET | Freq: Once | ORAL | Status: AC

## 2014-12-04 MED ORDER — SODIUM CHLORIDE 0.9 % IJ SOLN
10.0000 mL | INTRAMUSCULAR | Status: AC | PRN
  Administered 2014-12-04: 10 mL

## 2014-12-04 MED ORDER — HEPARIN SOD (PORK) LOCK FLUSH 100 UNIT/ML IV SOLN
500.0000 [IU] | Freq: Every day | INTRAVENOUS | Status: AC | PRN
  Administered 2014-12-04: 500 [IU]

## 2014-12-04 MED ORDER — SODIUM CHLORIDE 0.9 % IV SOLN
250.0000 mL | Freq: Once | INTRAVENOUS | Status: AC
  Administered 2014-12-04: 250 mL via INTRAVENOUS

## 2014-12-04 MED ORDER — DIPHENHYDRAMINE HCL 25 MG PO CAPS: 25.0000 mg | ORAL_CAPSULE | Freq: Once | ORAL | Status: AC

## 2014-12-04 MED ORDER — SODIUM CHLORIDE 0.9 % IJ SOLN: 10.0000 mL | INTRAMUSCULAR | Status: DC | PRN

## 2014-12-04 MED ORDER — HEPARIN SOD (PORK) LOCK FLUSH 100 UNIT/ML IV SOLN: 250.0000 [IU] | INTRAVENOUS | Status: DC | PRN

## 2014-12-04 MED ORDER — HEPARIN SOD (PORK) LOCK FLUSH 100 UNIT/ML IV SOLN
500.0000 [IU] | Freq: Every day | INTRAVENOUS | Status: DC | PRN
  Filled 2014-12-04: qty 5

## 2014-12-04 MED ORDER — SODIUM CHLORIDE 0.9 % IV SOLN: 250.0000 mL | Freq: Once | INTRAVENOUS | Status: AC

## 2014-12-04 MED ORDER — ACETAMINOPHEN 325 MG PO TABS
650.0000 mg | ORAL_TABLET | Freq: Once | ORAL | Status: AC
  Administered 2014-12-04: 650 mg via ORAL
  Filled 2014-12-04: qty 2

## 2014-12-04 MED ORDER — SODIUM CHLORIDE 0.9 % IJ SOLN: 3.0000 mL | INTRAMUSCULAR | Status: DC | PRN

## 2014-12-04 MED ORDER — DIPHENHYDRAMINE HCL 25 MG PO CAPS
25.0000 mg | ORAL_CAPSULE | Freq: Once | ORAL | Status: AC
  Administered 2014-12-04: 25 mg via ORAL
  Filled 2014-12-04: qty 1

## 2014-12-04 MED ORDER — HEPARIN SOD (PORK) LOCK FLUSH 100 UNIT/ML IV SOLN: 500.0000 [IU] | Freq: Every day | INTRAVENOUS | Status: DC | PRN

## 2014-12-04 MED ORDER — SODIUM CHLORIDE 0.9 % IV SOLN: Freq: Once | INTRAVENOUS | Status: DC

## 2014-12-04 NOTE — Procedures (Signed)
Associated Diagnosis: Anemia, unspecified and thrombocytopenia MD: RAQTMA  Procedure Note. Port accessed per protocol, blood return noted, Transfused 1 unit platelets and 1 unit PRBC'S, port de accessed per protocol  Condition during procedure: Tolerated well Condition after procedure: Alert, oriented, and ambulatory

## 2014-12-07 ENCOUNTER — Ambulatory Visit (HOSPITAL_COMMUNITY)
Admission: RE | Admit: 2014-12-07 | Discharge: 2014-12-07 | Disposition: A | Discharge status: Home / Self Care | Payer: Medicare Other | Source: Ambulatory Visit

## 2014-12-07 ENCOUNTER — Other Ambulatory Visit: Payer: Self-pay | Admitting: *Deleted

## 2014-12-07 ENCOUNTER — Other Ambulatory Visit (HOSPITAL_BASED_OUTPATIENT_CLINIC_OR_DEPARTMENT_OTHER): Payer: Medicare Other

## 2014-12-07 ENCOUNTER — Ambulatory Visit (HOSPITAL_COMMUNITY)
Admission: RE | Admit: 2014-12-07 | Discharge: 2014-12-07 | Disposition: A | Discharge status: Home / Self Care | Payer: Medicare Other | Source: Ambulatory Visit | Attending: Oncology | Admitting: Oncology

## 2014-12-07 ENCOUNTER — Ambulatory Visit (HOSPITAL_BASED_OUTPATIENT_CLINIC_OR_DEPARTMENT_OTHER): Payer: Medicare Other

## 2014-12-07 VITALS — BP 97/47 | HR 64 | Temp 98.5°F | Resp 16

## 2014-12-07 VITALS — BP 156/53 | HR 62 | Temp 98.4°F | Resp 18

## 2014-12-07 DIAGNOSIS — C9200 Acute myeloblastic leukemia, not having achieved remission: Secondary | ICD-10-CM

## 2014-12-07 DIAGNOSIS — D649 Anemia, unspecified: Secondary | ICD-10-CM

## 2014-12-07 DIAGNOSIS — D696 Thrombocytopenia, unspecified: Secondary | ICD-10-CM

## 2014-12-07 DIAGNOSIS — Z029 Encounter for administrative examinations, unspecified: Secondary | ICD-10-CM | POA: Diagnosis not present

## 2014-12-07 DIAGNOSIS — D6489 Other specified anemias: Secondary | ICD-10-CM

## 2014-12-07 DIAGNOSIS — Z95828 Presence of other vascular implants and grafts: Secondary | ICD-10-CM

## 2014-12-07 DIAGNOSIS — D630 Anemia in neoplastic disease: Secondary | ICD-10-CM

## 2014-12-07 LAB — PREPARE PLATELET PHERESIS: Unit division: 0

## 2014-12-07 LAB — CBC WITH DIFFERENTIAL/PLATELET
BASO%: 0.7 % (ref 0.0–2.0)
Basophils Absolute: 0 10*3/uL (ref 0.0–0.1)
EOS%: 0 % (ref 0.0–7.0)
Eosinophils Absolute: 0 10*3/uL (ref 0.0–0.5)
HEMATOCRIT: 24.4 % — AB (ref 38.4–49.9)
HGB: 8.3 g/dL — ABNORMAL LOW (ref 13.0–17.1)
LYMPH#: 1.1 10*3/uL (ref 0.9–3.3)
LYMPH%: 81.4 % — AB (ref 14.0–49.0)
MCH: 31.1 pg (ref 27.2–33.4)
MCHC: 34 g/dL (ref 32.0–36.0)
MCV: 91.4 fL (ref 79.3–98.0)
MONO#: 0.2 10*3/uL (ref 0.1–0.9)
MONO%: 16.4 % — ABNORMAL HIGH (ref 0.0–14.0)
NEUT%: 1.5 % — ABNORMAL LOW (ref 39.0–75.0)
NEUTROS ABS: 0 10*3/uL — AB (ref 1.5–6.5)
NRBC: 0 % (ref 0–0)
Platelets: 3 10*3/uL — CL (ref 140–400)
RBC: 2.67 10*6/uL — AB (ref 4.20–5.82)
RDW: 16.3 % — ABNORMAL HIGH (ref 11.0–14.6)
WBC: 1.4 10*3/uL — AB (ref 4.0–10.3)

## 2014-12-07 LAB — COMPREHENSIVE METABOLIC PANEL (CC13)
ALT: 10 U/L (ref 0–55)
AST: 23 U/L (ref 5–34)
Albumin: 2.4 g/dL — ABNORMAL LOW (ref 3.5–5.0)
Alkaline Phosphatase: 90 U/L (ref 40–150)
Anion Gap: 9 mEq/L (ref 3–11)
BILIRUBIN TOTAL: 0.84 mg/dL (ref 0.20–1.20)
BUN: 33.9 mg/dL — ABNORMAL HIGH (ref 7.0–26.0)
CALCIUM: 8.7 mg/dL (ref 8.4–10.4)
CHLORIDE: 104 meq/L (ref 98–109)
CO2: 26 mEq/L (ref 22–29)
Creatinine: 1.6 mg/dL — ABNORMAL HIGH (ref 0.7–1.3)
EGFR: 42 mL/min/{1.73_m2} — AB (ref >90)
Glucose: 89 mg/dl (ref 70–140)
Potassium: 3.9 mEq/L (ref 3.5–5.1)
Sodium: 139 mEq/L (ref 136–145)
TOTAL PROTEIN: 5.7 g/dL — AB (ref 6.4–8.3)

## 2014-12-07 LAB — TECHNOLOGIST REVIEW

## 2014-12-07 LAB — TYPE AND SCREEN
ABO/RH(D): O POS
Antibody Screen: NEGATIVE
UNIT DIVISION: 0

## 2014-12-07 LAB — PREPARE RBC (CROSSMATCH)

## 2014-12-07 LAB — MAGNESIUM (CC13): Magnesium: 1.7 mg/dl (ref 1.5–2.5)

## 2014-12-07 LAB — HOLD TUBE, BLOOD BANK

## 2014-12-07 LAB — URIC ACID (CC13): Uric Acid, Serum: 10.5 mg/dl — ABNORMAL HIGH (ref 2.6–7.4)

## 2014-12-07 MED ORDER — HEPARIN SOD (PORK) LOCK FLUSH 100 UNIT/ML IV SOLN
500.0000 [IU] | Freq: Every day | INTRAVENOUS | Status: AC | PRN
  Administered 2014-12-07: 500 [IU]
  Filled 2014-12-07: qty 5

## 2014-12-07 MED ORDER — DIPHENHYDRAMINE HCL 25 MG PO CAPS
25.0000 mg | ORAL_CAPSULE | Freq: Once | ORAL | Status: AC
  Administered 2014-12-07: 25 mg via ORAL
  Filled 2014-12-07: qty 1

## 2014-12-07 MED ORDER — ACETAMINOPHEN 325 MG PO TABS
650.0000 mg | ORAL_TABLET | Freq: Once | ORAL | Status: AC
  Administered 2014-12-07: 650 mg via ORAL
  Filled 2014-12-07: qty 2

## 2014-12-07 MED ORDER — SODIUM CHLORIDE 0.9 % IV SOLN
250.0000 mL | Freq: Once | INTRAVENOUS | Status: AC
  Administered 2014-12-07: 250 mL via INTRAVENOUS

## 2014-12-07 MED ORDER — SODIUM CHLORIDE 0.9 % IJ SOLN
10.0000 mL | INTRAMUSCULAR | Status: DC | PRN
  Administered 2014-12-07: 10 mL via INTRAVENOUS
  Filled 2014-12-07: qty 10

## 2014-12-07 MED ORDER — SODIUM CHLORIDE 0.9 % IJ SOLN
10.0000 mL | INTRAMUSCULAR | Status: AC | PRN
  Administered 2014-12-07: 10 mL

## 2014-12-07 NOTE — Procedures (Signed)
Associated Diagnosis: Anemia unspecified D. 64.9 MD: Alen Blew  Procedure Note: Port flushed blood return noted, infused 1 unit platelets and 1 unit PRB's, Port de accessed per protocol  Condition during procedure: Tolerated well Condition after procedure: Alert, oriented, and ambulatory

## 2014-12-08 LAB — TYPE AND SCREEN
ABO/RH(D): O POS
ANTIBODY SCREEN: NEGATIVE
UNIT DIVISION: 0

## 2014-12-08 LAB — PREPARE PLATELET PHERESIS: UNIT DIVISION: 0

## 2014-12-10 ENCOUNTER — Ambulatory Visit (HOSPITAL_BASED_OUTPATIENT_CLINIC_OR_DEPARTMENT_OTHER): Payer: Medicare Other

## 2014-12-10 ENCOUNTER — Other Ambulatory Visit: Payer: Self-pay | Admitting: *Deleted

## 2014-12-10 ENCOUNTER — Other Ambulatory Visit (HOSPITAL_BASED_OUTPATIENT_CLINIC_OR_DEPARTMENT_OTHER): Payer: Medicare Other

## 2014-12-10 ENCOUNTER — Ambulatory Visit (HOSPITAL_COMMUNITY)
Admission: RE | Admit: 2014-12-10 | Discharge: 2014-12-10 | Disposition: A | Discharge status: Home / Self Care | Payer: Medicare Other | Source: Ambulatory Visit | Attending: Oncology | Admitting: Oncology

## 2014-12-10 ENCOUNTER — Encounter: Payer: Self-pay | Admitting: *Deleted

## 2014-12-10 ENCOUNTER — Ambulatory Visit: Payer: Non-veteran care

## 2014-12-10 VITALS — BP 153/67 | HR 70 | Temp 97.5°F | Resp 18

## 2014-12-10 VITALS — BP 133/70 | HR 73 | Temp 98.3°F | Resp 18

## 2014-12-10 DIAGNOSIS — Z95828 Presence of other vascular implants and grafts: Secondary | ICD-10-CM

## 2014-12-10 DIAGNOSIS — D649 Anemia, unspecified: Secondary | ICD-10-CM | POA: Diagnosis not present

## 2014-12-10 DIAGNOSIS — D509 Iron deficiency anemia, unspecified: Secondary | ICD-10-CM

## 2014-12-10 DIAGNOSIS — D6489 Other specified anemias: Secondary | ICD-10-CM

## 2014-12-10 DIAGNOSIS — C9200 Acute myeloblastic leukemia, not having achieved remission: Secondary | ICD-10-CM | POA: Diagnosis not present

## 2014-12-10 DIAGNOSIS — Z029 Encounter for administrative examinations, unspecified: Secondary | ICD-10-CM | POA: Diagnosis not present

## 2014-12-10 LAB — COMPREHENSIVE METABOLIC PANEL (CC13)
ALT: 9 U/L (ref 0–55)
AST: 30 U/L (ref 5–34)
Albumin: 2.5 g/dL — ABNORMAL LOW (ref 3.5–5.0)
Alkaline Phosphatase: 89 U/L (ref 40–150)
Anion Gap: 14 meq/L — ABNORMAL HIGH (ref 3–11)
BUN: 32.8 mg/dL — ABNORMAL HIGH (ref 7.0–26.0)
CO2: 23 meq/L (ref 22–29)
Calcium: 8.7 mg/dL (ref 8.4–10.4)
Chloride: 104 meq/L (ref 98–109)
Creatinine: 1.3 mg/dL (ref 0.7–1.3)
EGFR: 55 mL/min/{1.73_m2} — ABNORMAL LOW
Glucose: 71 mg/dL (ref 70–140)
Potassium: 3.7 meq/L (ref 3.5–5.1)
Sodium: 140 meq/L (ref 136–145)
Total Bilirubin: 0.79 mg/dL (ref 0.20–1.20)
Total Protein: 5.8 g/dL — ABNORMAL LOW (ref 6.4–8.3)

## 2014-12-10 LAB — MANUAL DIFFERENTIAL
ALC: 3.3 10*3/uL (ref 0.9–3.3)
ANC (CHCC MAN DIFF): 0 10*3/uL — AB (ref 1.5–6.5)
Band Neutrophils: 0 % (ref 0–10)
Basophil: 0 % (ref 0–2)
Blasts: 0 % (ref 0–0)
EOS%: 0 % (ref 0–7)
LYMPH: 47 % (ref 14–49)
MONO: 15 % — ABNORMAL HIGH (ref 0–14)
MYELOCYTES: 0 % (ref 0–0)
Metamyelocytes: 0 % (ref 0–0)
NRBC: 0 % (ref 0–0)
Other Cell: 38 % — ABNORMAL HIGH (ref 0–0)
PLT EST: DECREASED
PROMYELO: 0 % (ref 0–0)
SEG: 0 % — AB (ref 38–77)
Variant Lymph: 0 % (ref 0–0)

## 2014-12-10 LAB — CBC WITH DIFFERENTIAL/PLATELET
HCT: 27.7 % — ABNORMAL LOW (ref 38.4–49.9)
HGB: 9.4 g/dL — ABNORMAL LOW (ref 13.0–17.1)
MCH: 30.9 pg (ref 27.2–33.4)
MCHC: 33.9 g/dL (ref 32.0–36.0)
MCV: 91.1 fL (ref 79.3–98.0)
Platelets: 5 10*3/uL — CL (ref 140–400)
RBC: 3.04 10*6/uL — ABNORMAL LOW (ref 4.20–5.82)
RDW: 16.7 % — ABNORMAL HIGH (ref 11.0–14.6)
WBC: 7 10*3/uL (ref 4.0–10.3)

## 2014-12-10 MED ORDER — SODIUM CHLORIDE 0.9 % IJ SOLN
10.0000 mL | INTRAMUSCULAR | Status: DC | PRN
  Administered 2014-12-10: 10 mL via INTRAVENOUS
  Filled 2014-12-10: qty 10

## 2014-12-10 MED ORDER — ACETAMINOPHEN 325 MG PO TABS
ORAL_TABLET | ORAL | Status: AC
  Filled 2014-12-10: qty 2

## 2014-12-10 MED ORDER — ACETAMINOPHEN 325 MG PO TABS
650.0000 mg | ORAL_TABLET | Freq: Once | ORAL | Status: AC
  Administered 2014-12-10: 650 mg via ORAL

## 2014-12-10 MED ORDER — HEPARIN SOD (PORK) LOCK FLUSH 100 UNIT/ML IV SOLN
500.0000 [IU] | Freq: Every day | INTRAVENOUS | Status: AC | PRN
  Administered 2014-12-10: 500 [IU]
  Filled 2014-12-10: qty 5

## 2014-12-10 MED ORDER — SODIUM CHLORIDE 0.9 % IV SOLN
250.0000 mL | Freq: Once | INTRAVENOUS | Status: AC
  Administered 2014-12-10: 250 mL via INTRAVENOUS

## 2014-12-10 MED ORDER — DIPHENHYDRAMINE HCL 25 MG PO CAPS
ORAL_CAPSULE | ORAL | Status: AC
  Filled 2014-12-10: qty 1

## 2014-12-10 MED ORDER — SODIUM CHLORIDE 0.9 % IJ SOLN
10.0000 mL | INTRAMUSCULAR | Status: AC | PRN
  Administered 2014-12-10: 10 mL
  Filled 2014-12-10: qty 10

## 2014-12-10 MED ORDER — DIPHENHYDRAMINE HCL 25 MG PO CAPS
25.0000 mg | ORAL_CAPSULE | Freq: Once | ORAL | Status: AC
  Administered 2014-12-10: 25 mg via ORAL

## 2014-12-10 NOTE — Patient Instructions (Signed)

## 2014-12-10 NOTE — Patient Instructions (Signed)
Platelet Transfusion Information °This is information about transfusions of platelets. Platelets are tiny cells made by the bone marrow and found in the blood. When a blood vessel is damaged, platelets rush to the damaged area to help form a clot. This begins the healing process. When platelets get very low, your blood may have trouble clotting. This may be from: °· Illness. °· Blood disorder. °· Chemotherapy to treat cancer. °Often, lower platelet counts do not cause problems.  °Platelets usually last for 7 to 10 days. If they are not used in an injury, they are broken down by the liver or spleen. °Symptoms of low platelet count include: °· Nosebleeds. °· Bleeding gums. °· Heavy periods. °· Bruising and tiny blood spots in the skin. °¨ Pinpoint spots of bleeding (petechiae). °¨ Larger bruises (purpura). °· Bleeding can be more serious if it happens in the brain or bowel. °Platelet transfusions are often used to keep the platelet count at an acceptable level. Serious bleeding due to low platelets is uncommon. °RISKS AND COMPLICATIONS °Severe side effects from platelet transfusions are uncommon. Minor reactions may include: °· Itching. °· Rashes. °· High temperature and shivering. °Medications are available to stop transfusion reactions. Let your health care provider know if you develop any of the above problems.  °If you are having platelet transfusions frequently, they may get less effective. This is called becoming refractory to platelets. It is uncommon. This can happen from non-immune causes and immune causes. Non-immune causes include: °· High temperatures. °· Some medications. °· An enlarged spleen. °Immune causes happen when your body discovers the platelets are not your own and begins making antibodies against them. The antibodies kill the platelets quickly. Even with platelet transfusions, you may still notice problems with bleeding or bruising. Let your health care providers know about this. Other things  can be done to help if this happens.  °BEFORE THE PROCEDURE  °· Your health care provider will check your platelet count regularly. °· If the platelet count is too low, it may be necessary to have a platelet transfusion. °· This is more important before certain procedures with a risk of bleeding, such as a spinal tap. °· Platelet transfusion reduces the risk of bleeding during or after the procedure. °· Except in emergencies, giving a transfusion requires a written consent. °Before blood is taken from a donor, a complete history is taken to make sure the person has no history of previous diseases, nor engages in risky social behavior. Examples of this are intravenous drug use or sexual activity with multiple partners. This could lead to infected blood or blood products being used. This history is taken in spite of the extensive testing to make sure the blood is safe. All blood products transfused are tested to make sure it is a match for the person getting the blood. It is also checked for infections. Blood is the safest it has ever been. The risk of getting an infection is very low. °PROCEDURE °· The platelets are stored in small plastic bags that are kept at a low temperature. °· Each bag is called a unit and sometimes two units are given. They are given through an intravenous line by drip infusion over about one-half hour. °· Usually blood is collected from multiple people to get enough to transfuse. °· Sometimes, the platelets are collected from a single person. This is done using a special machine that separates the platelets from the blood. The machine is called an apheresis machine. Platelets collected in this   way are called apheresed platelets. Apheresed platelets reduce the risk of becoming sensitive to the platelets. This lowers the chances of having a transfusion reaction. °· As it only takes a short time to give the platelets, this treatment can be given in an outpatient department. Platelets can also be  given before or after other treatments. °SEEK IMMEDIATE MEDICAL CARE IF: °You have any of the following symptoms over the next 12 hours or several days: °· Shaking chills. °· Fever with a temperature greater than 102°F (38.9°C) develops. °· Back pain or muscle pain. °· People around you feel you are not acting correctly, or you are confused. °· Blood in the urine or bowel movements, or bleeding from any place in your body. °· Shortness of breath, or difficulty breathing. °· Dizziness. °· Fainting. °· You break out in a rash or develop hives. °· Decrease in the amount of urine you are putting out, or the urine turns a dark color or changes to pink, red, or brown. °· A severe headache or stiff neck. °· Bruising more easily. °Document Released: 02/19/2007 Document Revised: 09/08/2013 Document Reviewed: 02/19/2007 °ExitCare® Patient Information ©2015 [employer]. This information is not intended to replace advice given to you by your health care provider. Make sure you discuss any questions you have with your health care provider. ° °

## 2014-12-10 NOTE — Progress Notes (Signed)
This RN faxed today's labs to [facility].

## 2014-12-11 LAB — PREPARE PLATELET PHERESIS: UNIT DIVISION: 0

## 2014-12-14 ENCOUNTER — Other Ambulatory Visit: Payer: Medicare Other

## 2014-12-21 ENCOUNTER — Encounter: Payer: Self-pay | Admitting: *Deleted

## 2014-12-21 ENCOUNTER — Ambulatory Visit: Payer: Medicare Other | Admitting: Cardiology

## 2014-12-21 NOTE — Progress Notes (Signed)
Patient expired 12/16/2014. appts deleted from epic. Dr Alen Blew notified.

## 2015-01-06 ENCOUNTER — Ambulatory Visit: Payer: Medicare Other | Admitting: Cardiology

## 2015-01-07 DEATH — deceased

## 2015-12-29 IMAGING — CT CT ABD-PELV W/ CM
2 of 5 series · 16 of 46 positions shown, 18 images · IV contrast (OMNIPAQUE)
Comparison: 04/18/2013

CLINICAL DATA: Mid abdominal pain.  History of leukemia.

EXAM:
CT ABDOMEN AND PELVIS WITH CONTRAST
TECHNIQUE: Multidetector CT imaging of the abdomen and pelvis was performed
using the standard protocol following bolus administration of
intravenous contrast.
CONTRAST:  100mL OMNIPAQUE IOHEXOL 300 MG/ML  SOLN

[Series 2: rtn a/p with · axial · 0.81mm/px · z∈[+853,+1288]mm · 13 of 99 slices shown, 15 images]
[im 6/99  soft-tissue]
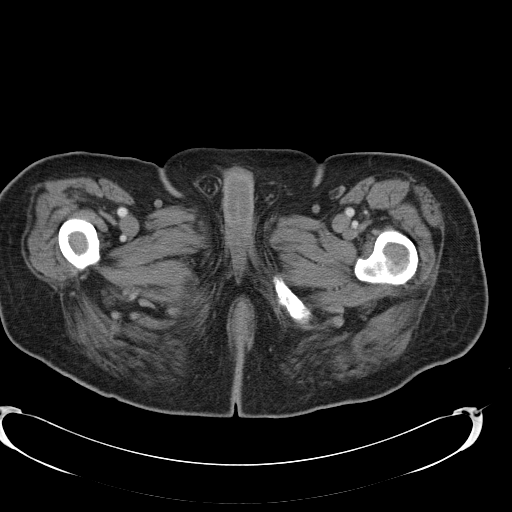
[im 6/99  bone]
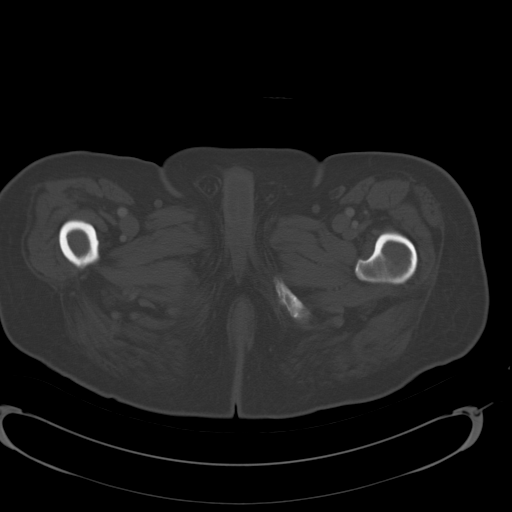
[im 11/99  soft-tissue]
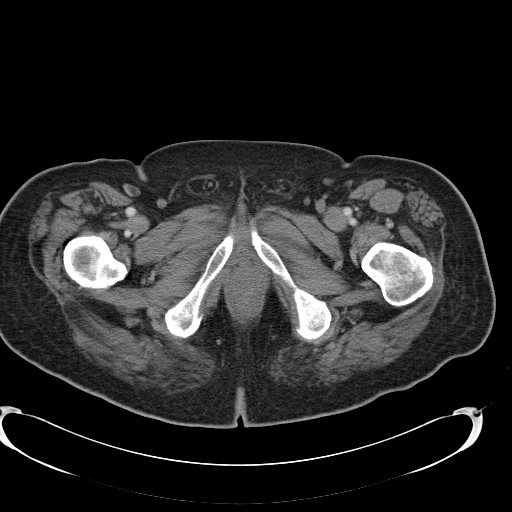
[im 22/99  soft-tissue]
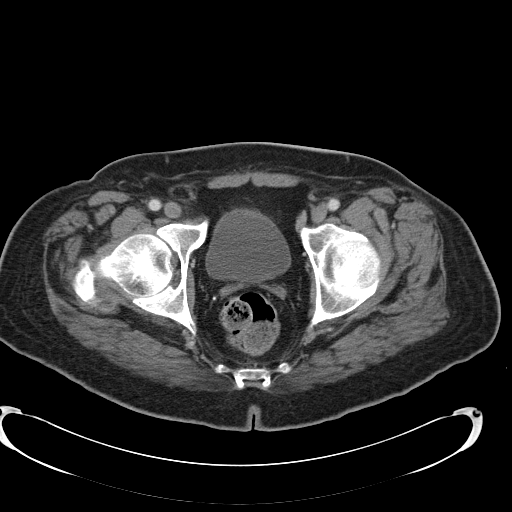
[im 28/99  soft-tissue]
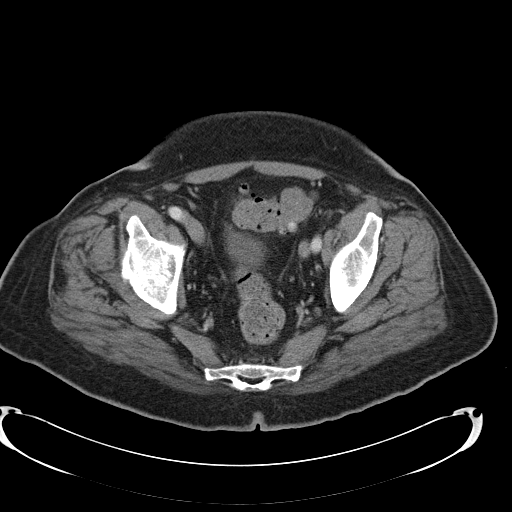
[im 33/99  soft-tissue]
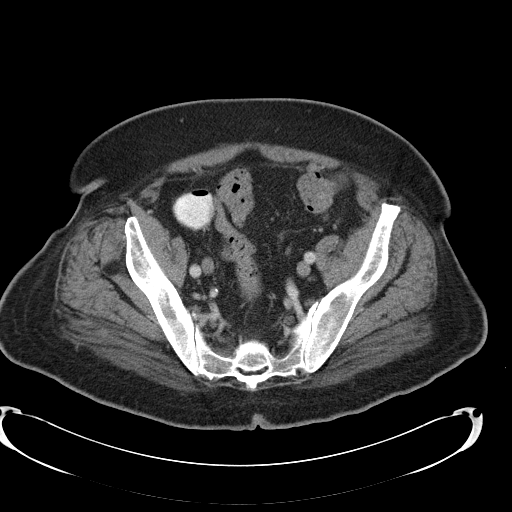
[im 44/99  soft-tissue]
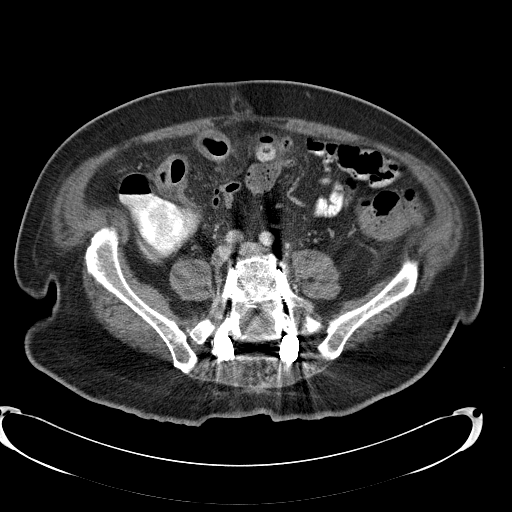
[im 50/99  soft-tissue]
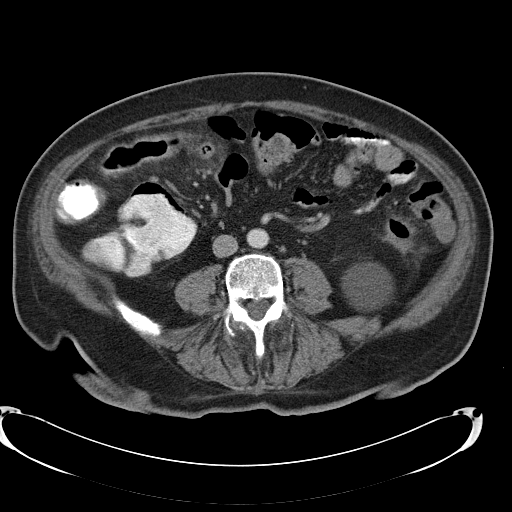
[im 55/99  soft-tissue]
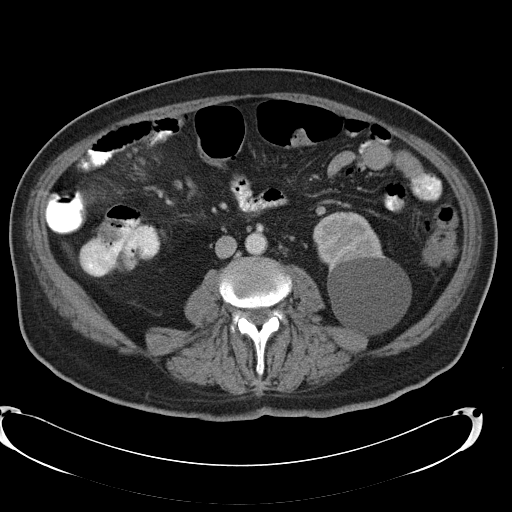
[im 66/99  soft-tissue]
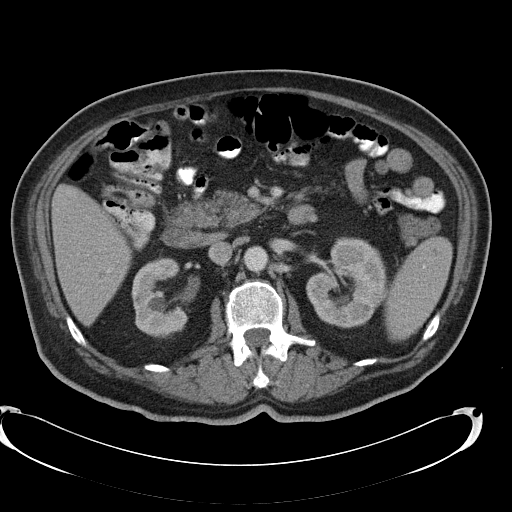
[im 66/99  bone]
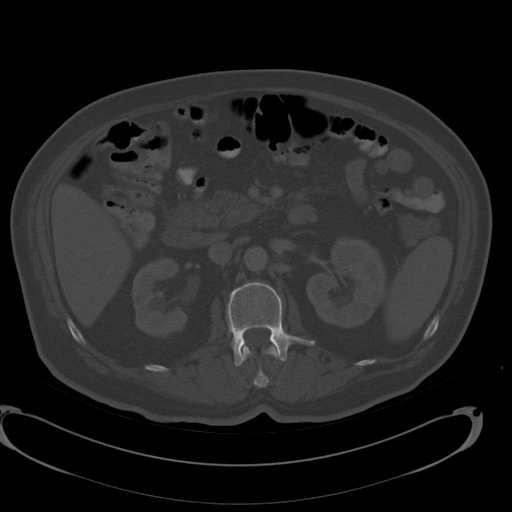
[im 71/99  soft-tissue]
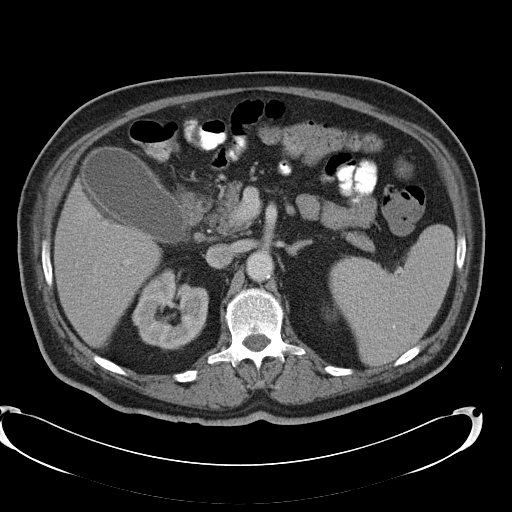
[im 77/99  soft-tissue]
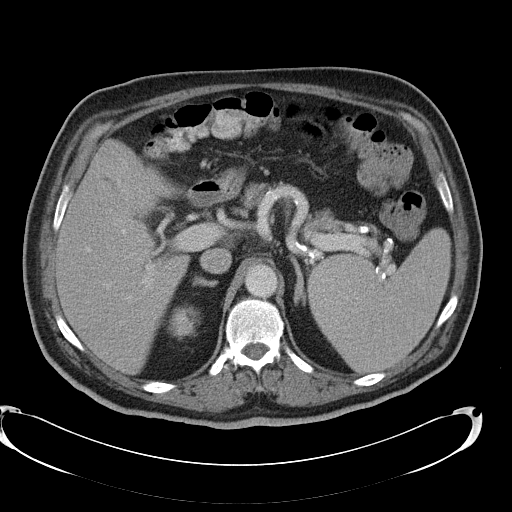
[im 88/99  soft-tissue]
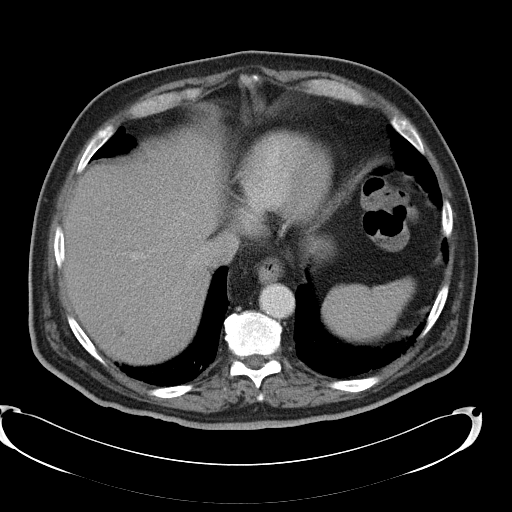
[im 93/99  soft-tissue]
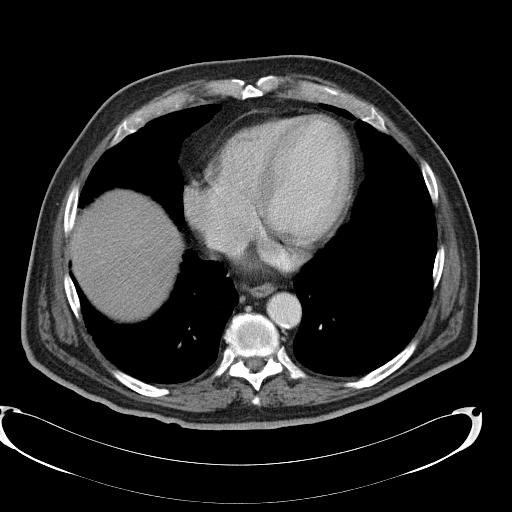

[Series 602: <mpr thick range> · coronal · 0.97mm/px · 3 of 109 slices shown]
[im 37/109  soft-tissue]
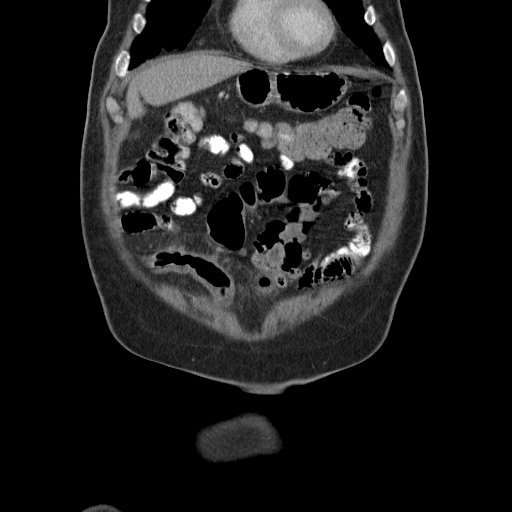
[im 49/109  soft-tissue]
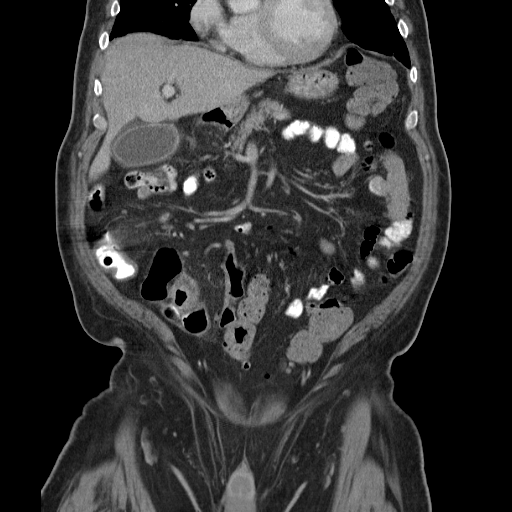
[im 61/109  soft-tissue]
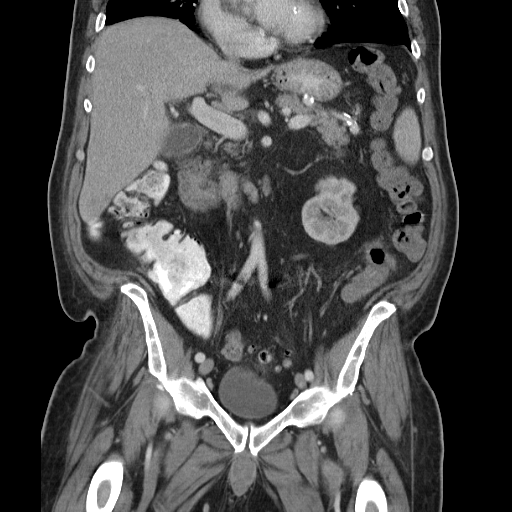

[16 of 46 positions shown; findings below may reference images not displayed]

FINDINGS: Gallbladder is distended. There is a thin rim of fluid surrounding
the gallbladder and increased enhancement of the wall. No radiopaque
stone is seen. There is no bile duct dilation.

Small low-density lesions are noted in the posterior aspect of the
left lobe and posterior aspect of the right lobe near the dome,
which were not evident previously. These are nonspecific. Liver is
otherwise unremarkable.

Spleen is mildly enlarged measuring 14.5 cm. No splenic mass or
focal lesion. Pancreas is unremarkable. No bile duct dilation. No
adrenal masses.

6.9 cm cyst from the posterior mid to lower pole of the left kidney.
Mild diffuse bilateral renal cortical thinning. Kidneys otherwise
unremarkable.

Normal ureters.  The normal bladder.  No adenopathy.  No ascites.

There are bowel abnormalities. Along the left colon at the junction
of the sigmoid and descending colon, there is a 2.6 cm diverticulum
with mild adjacent inflammatory change. No extraluminal air or
abscess. There is also mild inflammatory change adjacent to
diverticula along the mid sigmoid colon in the anterior upper
pelvis. Other colonic diverticula are noted without inflammatory
change.

The distal ileum is thick wall or approximately 20 cm in length with
mild associated mesenteric inflammatory change in several small
prominent mesenteric lymph nodes.

No other bowel abnormalities.

Lung branches show coarse reticular areas of subsegmental
atelectasis and scarring.

There has been a posterior L5-S1 fusion. Orthopedic hardware is
well-seated. There are no osteoblastic or osteolytic lesions.
IMPRESSION: 1. Two areas of mild diverticulitis are noted, 1 at the junction of
the descending and sigmoid colon and the other along the mid sigmoid
colon. No extraluminal air or abscess.
2. Wall thickening of the distal ileum consistent with an infectious
or inflammatory ileitis.
3. Fluid attenuation surrounds the gallbladder. Although no stone is
seen, acute cholecystitis should be considered in the proper
clinical setting. The fluid in the gallbladder may be reactive to
the adjacent small bowel pathology.
4. No other evidence of an acute abnormality. Multiple chronic
findings as detailed.

## 2016-07-31 IMAGING — CR DG CHEST 1V PORT
1 series · 1 of 1 positions shown · non-contrast
Comparison: 09/30/2013

CLINICAL DATA: Tachycardia

EXAM:
PORTABLE CHEST - 1 VIEW

[AP]
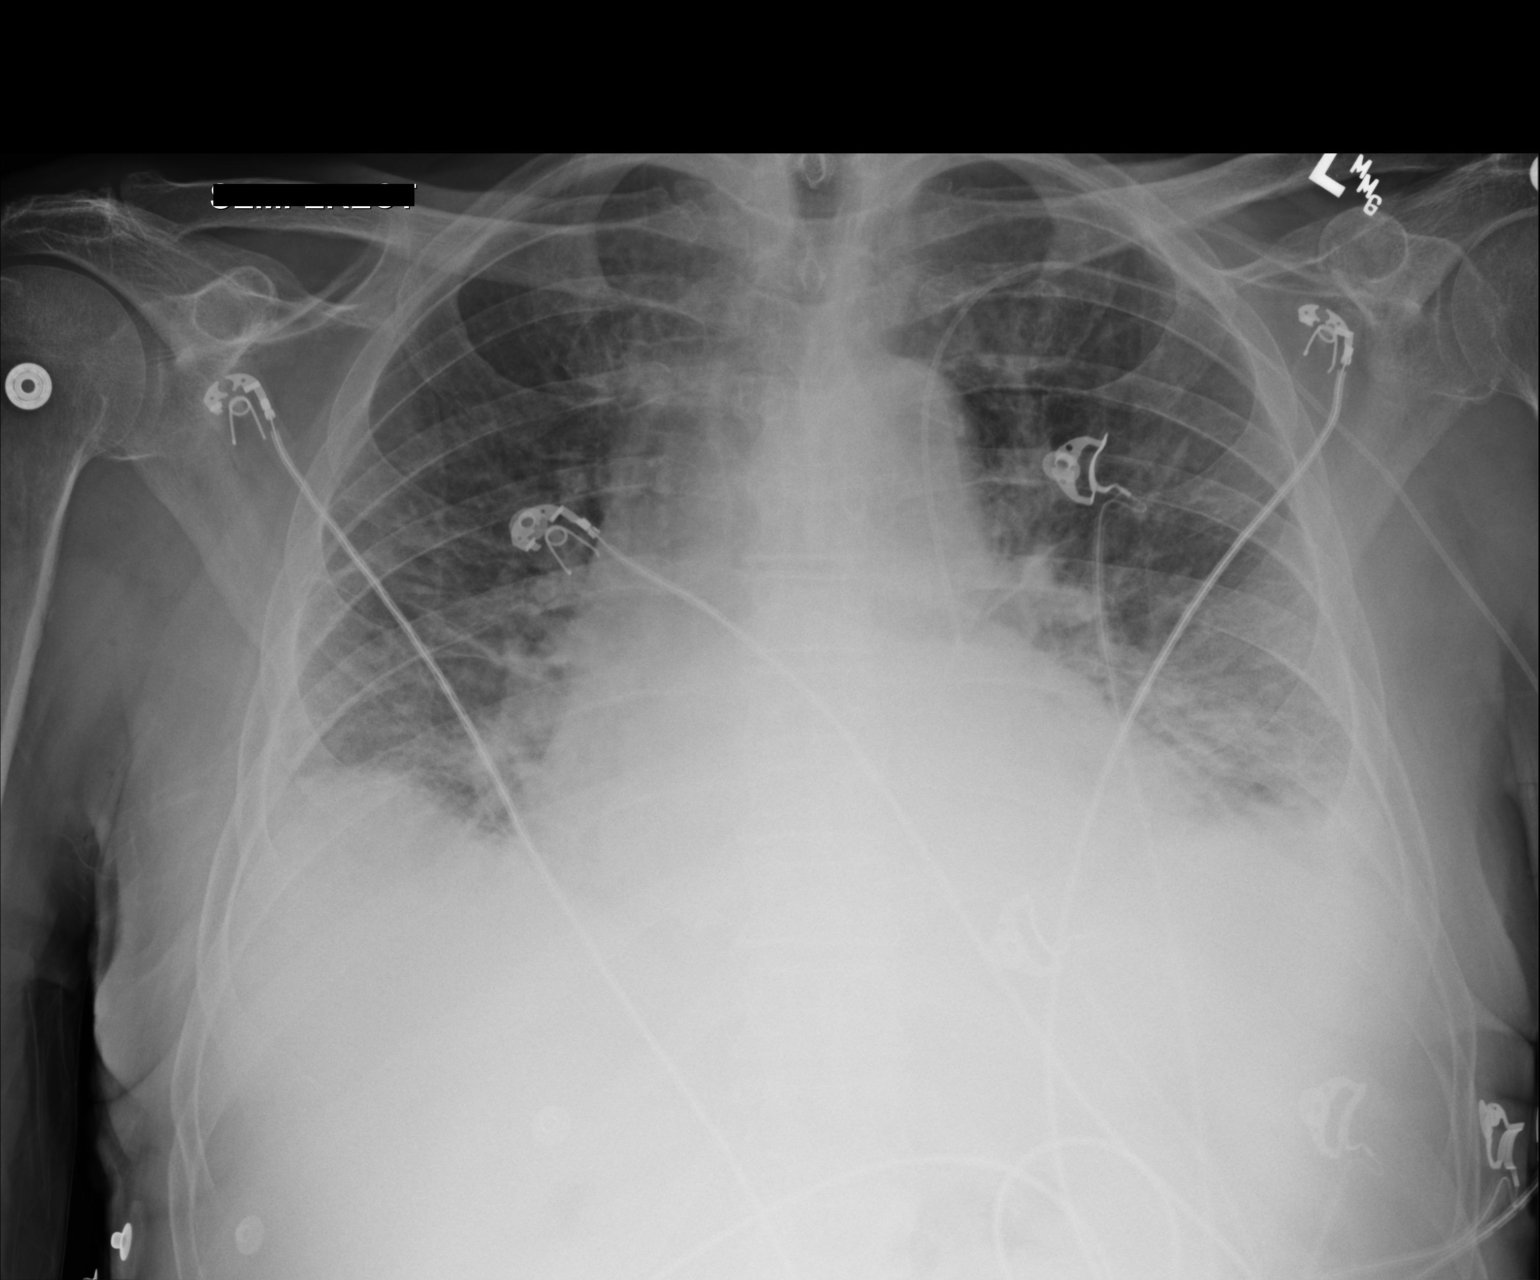

[1 of 1 positions shown; findings below may reference images not displayed]

FINDINGS: New cardiomegaly which is accentuated by low lung volumes. There is
widening of the vascular pedicle.

There is a left upper extremity PICC, with tip overlapping the
descending thoracic aorta. A persistent left SVC is present on
abdominal CT 04/18/2013.

There is diffuse interstitial opacity and small bilateral pleural
effusions. No pneumothorax.

These results were called by telephone at the time of interpretation
on 05/04/2014 at [DATE] to Dr. HAMLET ASHTON , who verbally
acknowledged these results.
IMPRESSION: 1. New cardiomegaly or pericardial effusion with CHF pattern. Noted
history of AML, note that ATRA syndrome can have this appearance.
2. Atypical positioning of left upper extremity PICC. If clinically
venous, the tip is likely within a persistent left SVC (present on
04/18/2013 abdominal CT). If arterial, the tip is in the descending
thoracic aorta.
